# Patient Record
Sex: Female | Born: 1940 | Race: Black or African American | Hispanic: No | Smoking: Never smoker
Health system: Southern US, Community
[De-identification: ages and names within clinical notes are randomized; demographics above are authoritative.]

## PROBLEM LIST (undated history)

## (undated) DIAGNOSIS — K635 Polyp of colon: Secondary | ICD-10-CM

## (undated) DIAGNOSIS — H409 Unspecified glaucoma: Secondary | ICD-10-CM

## (undated) DIAGNOSIS — K5909 Other constipation: Secondary | ICD-10-CM

## (undated) DIAGNOSIS — I10 Essential (primary) hypertension: Secondary | ICD-10-CM

## (undated) DIAGNOSIS — D500 Iron deficiency anemia secondary to blood loss (chronic): Secondary | ICD-10-CM

## (undated) DIAGNOSIS — F411 Generalized anxiety disorder: Secondary | ICD-10-CM

## (undated) DIAGNOSIS — K219 Gastro-esophageal reflux disease without esophagitis: Secondary | ICD-10-CM

## (undated) DIAGNOSIS — M545 Low back pain, unspecified: Secondary | ICD-10-CM

## (undated) DIAGNOSIS — E782 Mixed hyperlipidemia: Secondary | ICD-10-CM

## (undated) DIAGNOSIS — K222 Esophageal obstruction: Secondary | ICD-10-CM

## (undated) DIAGNOSIS — M949 Disorder of cartilage, unspecified: Secondary | ICD-10-CM

## (undated) DIAGNOSIS — E119 Type 2 diabetes mellitus without complications: Secondary | ICD-10-CM

## (undated) DIAGNOSIS — F4321 Adjustment disorder with depressed mood: Secondary | ICD-10-CM

## (undated) DIAGNOSIS — M1990 Unspecified osteoarthritis, unspecified site: Secondary | ICD-10-CM

## (undated) DIAGNOSIS — M899 Disorder of bone, unspecified: Secondary | ICD-10-CM

## (undated) HISTORY — PX: HEMORROIDECTOMY: SUR656

## (undated) HISTORY — PX: OTHER SURGICAL HISTORY: SHX169

## (undated) HISTORY — DX: Polyp of colon: K63.5

## (undated) HISTORY — PX: BACK SURGERY: SHX140

## (undated) HISTORY — DX: Other constipation: K59.09

## (undated) HISTORY — DX: Unspecified glaucoma: H40.9

## (undated) HISTORY — DX: Disorder of bone, unspecified: M89.9

## (undated) HISTORY — DX: Low back pain, unspecified: M54.50

## (undated) HISTORY — PX: ROTATOR CUFF REPAIR: SHX139

## (undated) HISTORY — PX: POLYPECTOMY: SHX149

## (undated) HISTORY — DX: Low back pain: M54.5

## (undated) HISTORY — DX: Unspecified osteoarthritis, unspecified site: M19.90

## (undated) HISTORY — PX: TUBAL LIGATION: SHX77

## (undated) HISTORY — DX: Esophageal obstruction: K22.2

## (undated) HISTORY — DX: Essential (primary) hypertension: I10

## (undated) HISTORY — DX: Disorder of cartilage, unspecified: M94.9

## (undated) HISTORY — DX: Gastro-esophageal reflux disease without esophagitis: K21.9

## (undated) HISTORY — DX: Mixed hyperlipidemia: E78.2

## (undated) HISTORY — DX: Iron deficiency anemia secondary to blood loss (chronic): D50.0

## (undated) HISTORY — PX: EYE SURGERY: SHX253

## (undated) HISTORY — DX: Generalized anxiety disorder: F41.1

## (undated) HISTORY — DX: Type 2 diabetes mellitus without complications: E11.9

## (undated) HISTORY — DX: Adjustment disorder with depressed mood: F43.21

---

## 1997-11-04 ENCOUNTER — Other Ambulatory Visit: Admission: RE | Admit: 1997-11-04 | Discharge: 1997-11-04 | Payer: Self-pay | Admitting: Obstetrics & Gynecology

## 1998-11-22 ENCOUNTER — Other Ambulatory Visit: Admission: RE | Admit: 1998-11-22 | Discharge: 1998-11-22 | Payer: Self-pay | Admitting: Obstetrics and Gynecology

## 1998-11-22 ENCOUNTER — Encounter (INDEPENDENT_AMBULATORY_CARE_PROVIDER_SITE_OTHER): Payer: Self-pay

## 1998-12-22 ENCOUNTER — Other Ambulatory Visit: Admission: RE | Admit: 1998-12-22 | Discharge: 1998-12-22 | Payer: Self-pay | Admitting: Obstetrics and Gynecology

## 1999-12-29 ENCOUNTER — Encounter: Admission: RE | Admit: 1999-12-29 | Discharge: 1999-12-29 | Payer: Self-pay | Admitting: Obstetrics and Gynecology

## 1999-12-29 ENCOUNTER — Encounter: Payer: Self-pay | Admitting: Obstetrics and Gynecology

## 2000-02-22 ENCOUNTER — Other Ambulatory Visit: Admission: RE | Admit: 2000-02-22 | Discharge: 2000-02-22 | Payer: Self-pay | Admitting: Obstetrics and Gynecology

## 2001-02-06 ENCOUNTER — Encounter: Admission: RE | Admit: 2001-02-06 | Discharge: 2001-02-06 | Payer: Self-pay | Admitting: Obstetrics and Gynecology

## 2001-02-06 ENCOUNTER — Encounter: Payer: Self-pay | Admitting: Obstetrics and Gynecology

## 2001-02-07 ENCOUNTER — Encounter: Admission: RE | Admit: 2001-02-07 | Discharge: 2001-02-07 | Payer: Self-pay | Admitting: Obstetrics and Gynecology

## 2001-02-07 ENCOUNTER — Encounter: Payer: Self-pay | Admitting: Obstetrics and Gynecology

## 2001-02-24 ENCOUNTER — Other Ambulatory Visit: Admission: RE | Admit: 2001-02-24 | Discharge: 2001-02-24 | Payer: Self-pay | Admitting: Obstetrics and Gynecology

## 2002-01-26 ENCOUNTER — Encounter: Admission: RE | Admit: 2002-01-26 | Discharge: 2002-01-26 | Payer: Self-pay | Admitting: Obstetrics and Gynecology

## 2002-01-26 ENCOUNTER — Encounter: Payer: Self-pay | Admitting: Obstetrics and Gynecology

## 2003-02-22 ENCOUNTER — Encounter: Admission: RE | Admit: 2003-02-22 | Discharge: 2003-02-22 | Payer: Self-pay | Admitting: Obstetrics and Gynecology

## 2003-02-22 ENCOUNTER — Encounter: Payer: Self-pay | Admitting: Obstetrics and Gynecology

## 2003-02-25 ENCOUNTER — Encounter: Payer: Self-pay | Admitting: Obstetrics and Gynecology

## 2003-02-25 ENCOUNTER — Encounter: Admission: RE | Admit: 2003-02-25 | Discharge: 2003-02-25 | Payer: Self-pay | Admitting: Obstetrics and Gynecology

## 2004-02-21 ENCOUNTER — Encounter: Admission: RE | Admit: 2004-02-21 | Discharge: 2004-02-21 | Payer: Self-pay | Admitting: Obstetrics and Gynecology

## 2004-05-14 DIAGNOSIS — K222 Esophageal obstruction: Secondary | ICD-10-CM

## 2004-05-14 HISTORY — DX: Esophageal obstruction: K22.2

## 2005-03-15 ENCOUNTER — Ambulatory Visit: Payer: Self-pay | Admitting: Gastroenterology

## 2005-03-21 ENCOUNTER — Ambulatory Visit: Payer: Self-pay | Admitting: Gastroenterology

## 2005-03-23 ENCOUNTER — Encounter: Admission: RE | Admit: 2005-03-23 | Discharge: 2005-03-23 | Payer: Self-pay | Admitting: Obstetrics and Gynecology

## 2005-03-29 ENCOUNTER — Ambulatory Visit: Payer: Self-pay | Admitting: Gastroenterology

## 2005-04-16 ENCOUNTER — Ambulatory Visit: Payer: Self-pay | Admitting: Gastroenterology

## 2006-03-26 ENCOUNTER — Encounter: Admission: RE | Admit: 2006-03-26 | Discharge: 2006-03-26 | Payer: Self-pay | Admitting: Obstetrics and Gynecology

## 2006-04-05 ENCOUNTER — Encounter: Admission: RE | Admit: 2006-04-05 | Discharge: 2006-04-05 | Payer: Self-pay | Admitting: Obstetrics and Gynecology

## 2006-05-21 ENCOUNTER — Encounter (HOSPITAL_COMMUNITY): Admission: RE | Admit: 2006-05-21 | Discharge: 2006-06-20 | Payer: Self-pay | Admitting: Orthopedic Surgery

## 2006-06-24 ENCOUNTER — Encounter (HOSPITAL_COMMUNITY): Admission: RE | Admit: 2006-06-24 | Discharge: 2006-07-24 | Payer: Self-pay | Admitting: Orthopedic Surgery

## 2006-07-29 ENCOUNTER — Encounter (HOSPITAL_COMMUNITY): Admission: RE | Admit: 2006-07-29 | Discharge: 2006-08-28 | Payer: Self-pay | Admitting: Orthopedic Surgery

## 2006-09-19 ENCOUNTER — Ambulatory Visit: Payer: Self-pay | Admitting: Gastroenterology

## 2006-10-04 ENCOUNTER — Emergency Department (HOSPITAL_COMMUNITY): Admission: EM | Admit: 2006-10-04 | Discharge: 2006-10-04 | Payer: Self-pay | Admitting: *Deleted
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
## 2007-04-01 ENCOUNTER — Encounter: Admission: RE | Admit: 2007-04-01 | Discharge: 2007-04-01 | Payer: Self-pay | Admitting: Obstetrics and Gynecology

## 2007-05-07 ENCOUNTER — Ambulatory Visit: Payer: Self-pay | Admitting: Family Medicine

## 2007-05-07 ENCOUNTER — Telehealth (INDEPENDENT_AMBULATORY_CARE_PROVIDER_SITE_OTHER): Payer: Self-pay | Admitting: *Deleted

## 2007-05-07 DIAGNOSIS — K219 Gastro-esophageal reflux disease without esophagitis: Secondary | ICD-10-CM

## 2007-05-07 DIAGNOSIS — M545 Low back pain: Secondary | ICD-10-CM

## 2007-05-07 DIAGNOSIS — M129 Arthropathy, unspecified: Secondary | ICD-10-CM

## 2007-05-07 DIAGNOSIS — H409 Unspecified glaucoma: Secondary | ICD-10-CM | POA: Insufficient documentation

## 2007-05-12 ENCOUNTER — Encounter (INDEPENDENT_AMBULATORY_CARE_PROVIDER_SITE_OTHER): Payer: Self-pay | Admitting: Family Medicine

## 2007-05-13 ENCOUNTER — Telehealth (INDEPENDENT_AMBULATORY_CARE_PROVIDER_SITE_OTHER): Payer: Self-pay | Admitting: *Deleted

## 2007-05-13 ENCOUNTER — Ambulatory Visit: Payer: Self-pay | Admitting: Cardiovascular Disease

## 2007-05-13 LAB — CONVERTED CEMR LAB
Basophils Absolute: 0 10*3/uL (ref 0.0–0.1)
Basophils Relative: 1 % (ref 0–1)
Cholesterol: 197 mg/dL (ref 0–200)
Creatinine, Ser: 0.88 mg/dL (ref 0.40–1.20)
Eosinophils Absolute: 0 10*3/uL (ref 0.0–0.7)
Eosinophils Relative: 1 % (ref 0–5)
HDL: 61 mg/dL (ref >39)
LDL Cholesterol: 121 mg/dL — ABNORMAL HIGH (ref 0–99)
Lymphocytes Relative: 53 % — ABNORMAL HIGH (ref 12–46)
Lymphs Abs: 1.8 10*3/uL (ref 0.7–4.0)
MCHC: 31.7 g/dL (ref 30.0–36.0)
Monocytes Absolute: 0.3 10*3/uL (ref 0.1–1.0)
Monocytes Relative: 7 % (ref 3–12)
Neutro Abs: 1.3 10*3/uL — ABNORMAL LOW (ref 1.7–7.7)
Sodium: 143 meq/L (ref 135–145)
Total CHOL/HDL Ratio: 3.2
Total Protein: 7.5 g/dL (ref 6.0–8.3)
WBC: 3.4 10*3/uL — ABNORMAL LOW (ref 4.0–10.5)

## 2007-05-29 ENCOUNTER — Ambulatory Visit: Payer: Self-pay | Admitting: Family Medicine

## 2007-05-29 LAB — CONVERTED CEMR LAB: HDL goal, serum: 40 mg/dL

## 2007-06-09 ENCOUNTER — Encounter (INDEPENDENT_AMBULATORY_CARE_PROVIDER_SITE_OTHER): Payer: Self-pay | Admitting: Family Medicine

## 2007-06-09 ENCOUNTER — Encounter (HOSPITAL_COMMUNITY): Admission: RE | Admit: 2007-06-09 | Discharge: 2007-07-09 | Payer: Self-pay | Admitting: Cardiovascular Disease

## 2007-06-09 ENCOUNTER — Ambulatory Visit: Payer: Self-pay | Admitting: Cardiovascular Disease

## 2007-06-11 ENCOUNTER — Emergency Department (HOSPITAL_COMMUNITY): Admission: EM | Admit: 2007-06-11 | Discharge: 2007-06-11 | Payer: Self-pay | Admitting: Emergency Medicine

## 2007-06-13 ENCOUNTER — Telehealth (INDEPENDENT_AMBULATORY_CARE_PROVIDER_SITE_OTHER): Payer: Self-pay | Admitting: Family Medicine

## 2007-06-26 ENCOUNTER — Ambulatory Visit: Payer: Self-pay | Admitting: Family Medicine

## 2007-07-10 ENCOUNTER — Ambulatory Visit: Payer: Self-pay | Admitting: Family Medicine

## 2007-08-15 ENCOUNTER — Ambulatory Visit: Payer: Self-pay | Admitting: Family Medicine

## 2007-08-21 ENCOUNTER — Encounter (INDEPENDENT_AMBULATORY_CARE_PROVIDER_SITE_OTHER): Payer: Self-pay | Admitting: Family Medicine

## 2007-09-01 ENCOUNTER — Telehealth (INDEPENDENT_AMBULATORY_CARE_PROVIDER_SITE_OTHER): Payer: Self-pay | Admitting: *Deleted

## 2007-09-01 ENCOUNTER — Ambulatory Visit: Payer: Self-pay | Admitting: Family Medicine

## 2007-09-01 LAB — CONVERTED CEMR LAB
Glucose, Urine, Semiquant: NEGATIVE
Nitrite: NEGATIVE
Specific Gravity, Urine: 1.025
WBC Urine, dipstick: NEGATIVE

## 2007-09-08 ENCOUNTER — Ambulatory Visit (HOSPITAL_COMMUNITY): Admission: RE | Admit: 2007-09-08 | Discharge: 2007-09-08 | Payer: Self-pay | Admitting: Family Medicine

## 2007-09-08 ENCOUNTER — Encounter (INDEPENDENT_AMBULATORY_CARE_PROVIDER_SITE_OTHER): Payer: Self-pay | Admitting: Family Medicine

## 2007-09-09 ENCOUNTER — Telehealth (INDEPENDENT_AMBULATORY_CARE_PROVIDER_SITE_OTHER): Payer: Self-pay | Admitting: *Deleted

## 2007-09-12 ENCOUNTER — Ambulatory Visit: Payer: Self-pay | Admitting: Family Medicine

## 2007-09-12 DIAGNOSIS — M899 Disorder of bone, unspecified: Secondary | ICD-10-CM | POA: Insufficient documentation

## 2007-09-12 DIAGNOSIS — M949 Disorder of cartilage, unspecified: Secondary | ICD-10-CM

## 2007-12-01 ENCOUNTER — Ambulatory Visit: Payer: Self-pay | Admitting: Family Medicine

## 2007-12-01 ENCOUNTER — Telehealth (INDEPENDENT_AMBULATORY_CARE_PROVIDER_SITE_OTHER): Payer: Self-pay | Admitting: Family Medicine

## 2007-12-01 DIAGNOSIS — I10 Essential (primary) hypertension: Secondary | ICD-10-CM | POA: Insufficient documentation

## 2007-12-12 ENCOUNTER — Telehealth (INDEPENDENT_AMBULATORY_CARE_PROVIDER_SITE_OTHER): Payer: Self-pay | Admitting: *Deleted

## 2007-12-29 ENCOUNTER — Ambulatory Visit: Payer: Self-pay | Admitting: Family Medicine

## 2007-12-29 DIAGNOSIS — K5900 Constipation, unspecified: Secondary | ICD-10-CM | POA: Insufficient documentation

## 2007-12-29 DIAGNOSIS — IMO0002 Reserved for concepts with insufficient information to code with codable children: Secondary | ICD-10-CM

## 2007-12-30 ENCOUNTER — Encounter (INDEPENDENT_AMBULATORY_CARE_PROVIDER_SITE_OTHER): Payer: Self-pay | Admitting: Family Medicine

## 2008-01-01 LAB — CONVERTED CEMR LAB

## 2008-01-26 ENCOUNTER — Ambulatory Visit: Payer: Self-pay | Admitting: Family Medicine

## 2008-02-13 ENCOUNTER — Encounter (INDEPENDENT_AMBULATORY_CARE_PROVIDER_SITE_OTHER): Payer: Self-pay | Admitting: Family Medicine

## 2008-02-19 ENCOUNTER — Encounter (INDEPENDENT_AMBULATORY_CARE_PROVIDER_SITE_OTHER): Payer: Self-pay | Admitting: Family Medicine

## 2008-02-27 ENCOUNTER — Ambulatory Visit: Payer: Self-pay | Admitting: Family Medicine

## 2008-02-27 DIAGNOSIS — D259 Leiomyoma of uterus, unspecified: Secondary | ICD-10-CM | POA: Insufficient documentation

## 2008-03-08 ENCOUNTER — Encounter (INDEPENDENT_AMBULATORY_CARE_PROVIDER_SITE_OTHER): Payer: Self-pay | Admitting: Family Medicine

## 2008-04-01 ENCOUNTER — Encounter: Admission: RE | Admit: 2008-04-01 | Discharge: 2008-04-01 | Payer: Self-pay | Admitting: Obstetrics and Gynecology

## 2008-05-28 ENCOUNTER — Ambulatory Visit: Payer: Self-pay | Admitting: Family Medicine

## 2008-06-09 ENCOUNTER — Encounter (INDEPENDENT_AMBULATORY_CARE_PROVIDER_SITE_OTHER): Payer: Self-pay | Admitting: Family Medicine

## 2008-08-27 ENCOUNTER — Ambulatory Visit: Payer: Self-pay | Admitting: Family Medicine

## 2008-08-31 ENCOUNTER — Encounter (INDEPENDENT_AMBULATORY_CARE_PROVIDER_SITE_OTHER): Payer: Self-pay | Admitting: Family Medicine

## 2008-09-02 LAB — CONVERTED CEMR LAB
ALT: 17 units/L (ref 0–35)
Alkaline Phosphatase: 65 units/L (ref 39–117)
CO2: 28 meq/L (ref 19–32)
Chloride: 105 meq/L (ref 96–112)
Eosinophils Relative: 0 % (ref 0–5)
LDL Cholesterol: 135 mg/dL — ABNORMAL HIGH (ref 0–99)
Lymphocytes Relative: 59 % — ABNORMAL HIGH (ref 12–46)
Lymphs Abs: 2.6 10*3/uL (ref 0.7–4.0)
MCHC: 32.1 g/dL (ref 30.0–36.0)
Monocytes Absolute: 0.4 10*3/uL (ref 0.1–1.0)
Neutro Abs: 1.4 10*3/uL — ABNORMAL LOW (ref 1.7–7.7)
Neutrophils Relative %: 32 % — ABNORMAL LOW (ref 43–77)
RBC: 4.4 M/uL (ref 3.87–5.11)
RDW: 14.4 % (ref 11.5–15.5)
Total CHOL/HDL Ratio: 3.9
Total Protein: 8 g/dL (ref 6.0–8.3)
VLDL: 23 mg/dL (ref 0–40)
WBC: 4.3 10*3/uL (ref 4.0–10.5)

## 2008-11-04 ENCOUNTER — Ambulatory Visit: Payer: Self-pay | Admitting: Family Medicine

## 2008-11-04 DIAGNOSIS — R1310 Dysphagia, unspecified: Secondary | ICD-10-CM | POA: Insufficient documentation

## 2008-11-11 DIAGNOSIS — Z86010 Personal history of colonic polyps: Secondary | ICD-10-CM | POA: Insufficient documentation

## 2008-11-11 DIAGNOSIS — K222 Esophageal obstruction: Secondary | ICD-10-CM

## 2008-11-11 DIAGNOSIS — K449 Diaphragmatic hernia without obstruction or gangrene: Secondary | ICD-10-CM | POA: Insufficient documentation

## 2008-11-11 DIAGNOSIS — K589 Irritable bowel syndrome without diarrhea: Secondary | ICD-10-CM

## 2008-11-12 ENCOUNTER — Ambulatory Visit: Payer: Self-pay | Admitting: Gastroenterology

## 2008-12-03 ENCOUNTER — Ambulatory Visit: Payer: Self-pay | Admitting: Family Medicine

## 2008-12-03 DIAGNOSIS — F4321 Adjustment disorder with depressed mood: Secondary | ICD-10-CM | POA: Insufficient documentation

## 2008-12-03 LAB — CONVERTED CEMR LAB
HDL goal, serum: 40 mg/dL
LDL Goal: 130 mg/dL

## 2008-12-08 ENCOUNTER — Emergency Department (HOSPITAL_COMMUNITY): Admission: EM | Admit: 2008-12-08 | Discharge: 2008-12-08 | Payer: Self-pay | Admitting: Emergency Medicine

## 2008-12-15 ENCOUNTER — Ambulatory Visit: Payer: Self-pay | Admitting: Gastroenterology

## 2008-12-15 ENCOUNTER — Ambulatory Visit: Payer: Self-pay | Admitting: Family Medicine

## 2008-12-15 DIAGNOSIS — S139XXA Sprain of joints and ligaments of unspecified parts of neck, initial encounter: Secondary | ICD-10-CM

## 2008-12-15 DIAGNOSIS — F411 Generalized anxiety disorder: Secondary | ICD-10-CM | POA: Insufficient documentation

## 2008-12-15 DIAGNOSIS — S335XXA Sprain of ligaments of lumbar spine, initial encounter: Secondary | ICD-10-CM

## 2008-12-17 LAB — CONVERTED CEMR LAB: UREASE: POSITIVE

## 2008-12-20 ENCOUNTER — Telehealth (INDEPENDENT_AMBULATORY_CARE_PROVIDER_SITE_OTHER): Payer: Self-pay | Admitting: Family Medicine

## 2008-12-20 ENCOUNTER — Telehealth: Payer: Self-pay | Admitting: Gastroenterology

## 2009-01-05 ENCOUNTER — Encounter (HOSPITAL_COMMUNITY): Admission: RE | Admit: 2009-01-05 | Discharge: 2009-02-04 | Payer: Self-pay | Admitting: Sports Medicine

## 2009-01-14 ENCOUNTER — Ambulatory Visit: Payer: Self-pay | Admitting: Family Medicine

## 2009-01-14 DIAGNOSIS — A048 Other specified bacterial intestinal infections: Secondary | ICD-10-CM | POA: Insufficient documentation

## 2009-01-14 DIAGNOSIS — E782 Mixed hyperlipidemia: Secondary | ICD-10-CM

## 2009-01-14 DIAGNOSIS — E1169 Type 2 diabetes mellitus with other specified complication: Secondary | ICD-10-CM | POA: Insufficient documentation

## 2009-02-10 ENCOUNTER — Ambulatory Visit: Payer: Self-pay | Admitting: Gastroenterology

## 2009-04-04 ENCOUNTER — Encounter: Admission: RE | Admit: 2009-04-04 | Discharge: 2009-04-04 | Payer: Self-pay | Admitting: Obstetrics and Gynecology

## 2009-04-11 ENCOUNTER — Encounter (INDEPENDENT_AMBULATORY_CARE_PROVIDER_SITE_OTHER): Payer: Self-pay | Admitting: Family Medicine

## 2009-05-10 ENCOUNTER — Ambulatory Visit: Payer: Self-pay | Admitting: Gastroenterology

## 2009-05-10 LAB — CONVERTED CEMR LAB
ALT: 14 units/L (ref 0–35)
Albumin: 4.4 g/dL (ref 3.5–5.2)
Alkaline Phosphatase: 54 units/L (ref 39–117)
Basophils Absolute: 0.1 10*3/uL (ref 0.0–0.1)
CO2: 28 meq/L (ref 19–32)
Calcium: 9.9 mg/dL (ref 8.4–10.5)
Eosinophils Relative: 0.4 % (ref 0.0–5.0)
Folate: 12.8 ng/mL
Glucose, Bld: 107 mg/dL — ABNORMAL HIGH (ref 70–99)
Iron: 89 ug/dL (ref 42–145)
Lymphs Abs: 1.9 10*3/uL (ref 0.7–4.0)
MCHC: 33.4 g/dL (ref 30.0–36.0)
Neutro Abs: 1.5 10*3/uL (ref 1.4–7.7)
Potassium: 4.1 meq/L (ref 3.5–5.1)
RDW: 13 % (ref 11.5–14.6)
Sodium: 142 meq/L (ref 135–145)
TSH: 2.13 microintl units/mL (ref 0.35–5.50)
Total Protein: 8 g/dL (ref 6.0–8.3)
Transferrin: 226.3 mg/dL (ref 212.0–360.0)
WBC: 3.7 10*3/uL — ABNORMAL LOW (ref 4.5–10.5)

## 2009-05-14 DIAGNOSIS — K635 Polyp of colon: Secondary | ICD-10-CM

## 2009-05-14 HISTORY — DX: Polyp of colon: K63.5

## 2009-05-18 ENCOUNTER — Ambulatory Visit: Payer: Self-pay | Admitting: Gastroenterology

## 2009-05-20 ENCOUNTER — Encounter: Payer: Self-pay | Admitting: Gastroenterology

## 2010-06-03 ENCOUNTER — Encounter: Payer: Self-pay | Admitting: Obstetrics and Gynecology

## 2010-06-04 ENCOUNTER — Encounter: Payer: Self-pay | Admitting: Obstetrics and Gynecology

## 2010-06-15 NOTE — Procedures (Signed)
Summary: Colonoscopy  Patient: Monica [name] Note: All result statuses are Final unless otherwise noted.  Tests: (1) Colonoscopy (COL)   COL Colonoscopy           [facility]ONE      [facility]     [address]     [place], [place]  [address]           COLONOSCOPY PROCEDURE REPORT           PATIENT:  Monica [name], Monica [name]  MR#:  [id number]     BIRTHDATE:  Nov 12, 1940, 68 yrs. old  GENDER:  female           ENDOSCOPIST:  Vania Rea. Jarold Motto, MD, [facility]     Referred by:           PROCEDURE DATE:  05/18/2009     PROCEDURE:  Colonoscopy with snare polypectomy     ASA CLASS:  Class II     INDICATIONS:  constipation, abdominal pain, Routine Risk Screening                 MEDICATIONS:   Fentanyl 50 mcg IV, Versed 5 mg IV           DESCRIPTION OF PROCEDURE:   After the risks benefits and     alternatives of the procedure were thoroughly explained, informed     consent was obtained.  Digital rectal exam was performed and     revealed no rectal masses.   The LB CF-[id number] endoscope     was introduced through the anus and advanced to the cecum, which     was identified by both the appendix and ileocecal valve, without     limitations.  The quality of the prep was excellent, using     MoviPrep.  The instrument was then slowly withdrawn as the colon     was fully examined.     <<PROCEDUREIMAGES>>           FINDINGS:  ENDOSCOPIC FINDINGS:  A sessile polyp was found. [name]5 CM     POLYP SNARE EXCISED. This was otherwise a normal examination of     the colon.   Retroflexed views in the rectum revealed no     abnormalities.    The scope was then withdrawn from the patient     and the procedure completed.           COMPLICATIONS:  None           ENDOSCOPIC IMPRESSION:     1) Sessile polyp     2) Otherwise normal examination     CHRONIC FUNCTIONAL CONSTIPATION.     RECOMMENDATIONS:     1) Follow up colonoscopy in 5 years           REPEAT EXAM:  No            ______________________________     Vania Rea. Jarold Motto, MD, [facility] Graham           CC:  Butch Penny, MD           n.     Rosalie DoctorVania Rea. Betty Brooks at 05/18/2009 03:33 PM           Drue Second, [id number]  Note: An exclamation mark (!) indicates a result that was not dispersed into the flowsheet. Document Creation Date: 05/18/2009 3:34 PM _______________________________________________________________________  (1) Order result status: Final Collection or observation date-time: 05/18/2009 15:25 Requested date-time:  Receipt date-time:  Reported date-time:  Referring  Physician:   Ordering Physician: Sheryn Bison [phone]) Specimen Source:  Source: Launa Grill Order Number: [id number] Lab site:   Appended Document: Colonoscopy     Procedures Next Due Date:    Colonoscopy: 05/2014

## 2010-06-15 NOTE — Letter (Signed)
Summary: Patient Notice- Polyp Results  [facility]  [address] [place], [place] [address]   Phone: [phone]  Fax: [phone]        May 20, 2009 MRN: [id number]    [address] [place], [place]  [address]    Dear Ms. Capelle,  I am pleased to inform you that the colon polyp(s) removed during your recent colonoscopy was (were) found to be benign (no cancer detected) upon pathologic examination.  I recommend you have a repeat colonoscopy examination in 5_ years to look for recurrent polyps, as having colon polyps increases your risk for having recurrent polyps or even colon cancer in the future.  Should you develop new or worsening symptoms of abdominal pain, bowel habit changes or bleeding from the rectum or bowels, please schedule an evaluation with either your primary care physician or with me.  Additional information/recommendations:  _XX_ No further action with gastroenterology is needed at this time. Please      follow-up with your primary care physician for your other healthcare      needs.  __ Please call [phone] to schedule a return visit to review your      situation.  __ Please keep your follow-up visit as already scheduled.  __ Continue treatment plan as outlined the day of your exam.  Please call us if you are having persistent problems or have questions about your condition that have not been fully answered at this time.  Sincerely,  Mardella Layman MD [facility]  This letter has been electronically signed by your physician.  Appended Document: Patient Notice- Polyp Results Letter mailed 01.10.11  Appended Document: Patient Notice- Polyp Results May 20, 2009 MRN: [id number]    [address] [place], [place]  [address]    Dear Ms. Elza,  I am pleased to inform you that the colon polyp(s) removed during your recent colonoscopy was (were) found to be benign (no cancer detected) upon pathologic examination.  I  recommend you have a repeat colonoscopy examination in 5_ years to look for recurrent polyps, as having colon polyps increases your risk for having recurrent polyps or even colon cancer in the future.  Should you develop new or worsening symptoms of abdominal pain, bowel habit changes or bleeding from the rectum or bowels, please schedule an evaluation with either your primary care physician or with me.  Additional information/recommendations:  _XX_ No further action with gastroenterology is needed at this time. Please      follow-up with your primary care physician for your other healthcare      needs.  __ Please call [phone] to schedule a return visit to review your      situation.  __ Please keep your follow-up visit as already scheduled.  __ Continue treatment plan as outlined the day of your exam.  Please call us if you are having persistent problems or have questions about your condition that have not been fully answered at this time.  Sincerely,  Mardella Layman MD [facility]  This letter has been electronically signed by your physician.   Letter returned no mail receptacle unable to forward. I had to resend letter with [address] address.

## 2010-09-26 NOTE — Assessment & Plan Note (Signed)
[facility]                         GASTROENTEROLOGY OFFICE NOTE   Monica [name]                      MRN:          [id number]  DATE:09/19/2006                            DOB:          [birth year]    HISTORY OF PRESENT ILLNESS:  Monica [name] continues to complain of some  periodic left lower quadrant growling.  Monica really denies pain or bare  [name] and is taking MiraLax on a fairly regular basis.  Monica  denies upper GI or hepatobiliary problems.  Is having no dysphagia or  reflux symptoms.  Last colonoscopy exam was completed in December 2006  and was unremarkable as was endoscopy except for esophageal stricture  that was dilated.  The patient cannot afford PPI therapy.   PHYSICAL EXAMINATION:  GENERAL:  Monica is awake and alert in no acute  distress, appears her stated age.  VITAL SIGNS:  Weight 146 pounds, blood pressure 140/86, pulse 60 and  regular.  ABDOMEN:  Entirely benign without organomegaly, masses, tenderness or  distention.  Bowel sounds were normal and nonobstructive in quality.   ASSESSMENT:  1. Irritable bowel syndrome.  2. History of chronic leukopenia, being followed by Dr. Eliberto Ivory with      blood work planned apparently within the next two weeks.  3. Chronic acid reflux disease, doing well on dietary therapy.   RECOMMENDATIONS:  1. High fiber diet with daily Benefiber and liberal p.o. fluids.  2. Continue p.r.n. MiraLax.  3. Blood work as mentioned above per Dr. Eliberto Ivory.     Monica Rea. Jarold Motto, MD, Caleen Essex, FAGA  Electronically Signed    DRP/MedQ  DD: 09/19/2006  DT: 09/19/2006  Job #: [id number]   cc:   Sabino Donovan Hull Dr. Onnie Boer H. Swords, MD

## 2010-09-26 NOTE — Assessment & Plan Note (Signed)
[facility]                       [place] CARDIOLOGY OFFICE NOTE   NORLEEN, XIE                      MRN:          [id number]  DATE:05/13/2007                            DOB:          1940-09-03    HISTORY OF PRESENT ILLNESS:  [name] is 70 year old patient referred  by Dr. Erby Pian for palpitations and chest pain.  The patient has no  documented previous history of heart problems.  She started having  problems about 4 weeks ago at night while lying in bed.  She had the  onset of some palpitations or forceful heartbeat.  She subsequently had  some mild tightening in her chest.  She said that feeling felt sort of  like bees fluttering.   She has continued to have occasional episodes.  They are not necessarily  exertional.  They can occur any time of the day.  They usually last 5-10  minutes.   There is no associated shortness of breath, syncope or diaphoresis.   She has not had a previous echo or stress test.   The patient apparently had some blood work of Dr. Claire Shown office  yesterday.  She said they were normal.  We will have to get these and  make sure that she had a thyroid check.  The patient has not had any  prior problems with palpitations.   No family history of sudden death or arrhythmia.   REVIEW OF SYSTEMS:  Otherwise, negative.  In particular she is really  not had classic exertional chest pain.  She has not had a history of PE  or DVT.  No history of cardiomyopathy.   PAST MEDICAL HISTORY:  1. Remarkable for irritable bowel syndrome which she has seen Dr.      Jarold Motto for in the past.  2. She has had previous back surgery.  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  3. There is also a history of right rotator cuff surgery.  4. She has no history of hypertension, hyperlipidemia, diabetes.   HABITS:  She is a nonsmoker, nondrinker.   ALLERGIES:  CODEINE CAUSES NAUSEA.   CURRENT MEDICATION:  Include  MiraLax p.r.n., vitamins, baby aspirin a  day and vitamin D.  She also takes Prevacid 30 mg a day.   FAMILY HISTORY:  Noncontributory.   SOCIAL HISTORY:  The patient does not work.  She has five children.  She  is divorced.  She spends most of her day taking care of one her only  grandchild.  She used to work at [employer], but does not anymore.  She is  otherwise fairly sedentary   PHYSICAL EXAMINATION:  GENERAL:  Remarkable for a healthy appearing,  middle-aged black female in no distress.  VITAL SIGNS:  Blood pressure is 140/80, pulse 64 and regular, weight is  140, afebrile, respiratory rate 14.  HEENT:  Unremarkable.  Carotids normal without bruit.  No  lymphadenopathy, thyromegaly or JVP elevation.  LUNGS:  Clear diaphragmatic motion.  No wheezing.  She is status post  right rotator cuff surgery.  HEART:  There is an S1,  S2, normal heart sounds.  PMI normal.  ABDOMEN:  Benign bowel sounds positive.  No bruit.  No  hepatosplenomegaly.  No jugular reflux or tenderness.  EXTREMITIES:  Distal pulses intact.  No edema.  NEUROLOGICAL:  Nonfocal.  SKIN:  Warm and dry.  No muscular weakness.   STUDIES:  EKG is normal with QT interval of 404.   IMPRESSION:  1. Atypical chest pain.  Follow up stress Myoview.  2. Palpitations, sound benign.  No long-lasting episodes of rapid      heartbeat.  Continue to monitor.  If these symptoms get worse, he      can have an event monitor.  3. History of irritable bowel.  Continue p.r.n. MiraLax.  Follow-up      with Dr. Jarold Motto.  4. History of reflux.  Continue Prevacid 30 mg a day, avoid spicy food      and late night meals.   So long as Monica [name]'s stress test is normal, I do not think she will need  further cardiac workup.     Monica [name]. Eden Emms, MD, [facility]  Electronically Signed    PCN/MedQ  DD: 05/13/2007  DT: 05/13/2007  Job #: [id number]   cc:   Franchot Heidelberg, M.D.

## 2010-09-29 NOTE — H&P (Signed)
NAMEMARIADEL, MRUK               ACCOUNT NO.:  [id number]   MEDICAL RECORD NO.:  [id number]          PATIENT TYPE:  REC   LOCATION:  [facility]                           FACILITY:  [facility]   PHYSICIAN:  Bruce H. Swords, MD    DATE OF BIRTH:  1940/10/25   DATE OF ADMISSION:  05/21/2006  DATE OF DISCHARGE:                              HISTORY & PHYSICAL   ADDENDUM   HISTORY AND PHYSICAL   The patient was walking out of my office to be transported to [facility].  She had a syncopal spell.  She was resuscitated with IV  fluids.  She became conscious but did pass out several times while in my  office.  EMS was called to transport.  Given these events, she will be  transported to the Emergency Department.  I will consult Critical Care.  I will admit her to the Intensive Care Unit.  She will need IV fluids,  oxygen.  I will rule her out for a myocardial infarction.  Will check an  EKG.      Bruce Rexene Edison Swords, MD  Electronically Signed     BHS/MEDQ  D:  06/13/2006  T:  06/13/2006  Job:  [id number]

## 2011-02-02 LAB — CBC
HCT: 36.4
Hemoglobin: 12.5
MCHC: 34.2
MCV: 91.3
Platelets: 255
WBC: 4.8

## 2011-02-02 LAB — DIFFERENTIAL
Lymphocytes Relative: 51 — ABNORMAL HIGH
Lymphs Abs: 2.4
Monocytes Absolute: 0.3
Neutro Abs: 2.1

## 2011-02-02 LAB — BASIC METABOLIC PANEL
BUN: 10
Chloride: 104
Creatinine, Ser: 0.77
GFR calc non Af Amer: >60
Sodium: 136

## 2011-02-27 ENCOUNTER — Other Ambulatory Visit (INDEPENDENT_AMBULATORY_CARE_PROVIDER_SITE_OTHER): Payer: Medicare HMO

## 2011-02-27 ENCOUNTER — Encounter: Payer: Self-pay | Admitting: Gastroenterology

## 2011-02-27 ENCOUNTER — Ambulatory Visit (INDEPENDENT_AMBULATORY_CARE_PROVIDER_SITE_OTHER): Payer: Medicare HMO | Admitting: Gastroenterology

## 2011-02-27 VITALS — BP 122/76 | HR 80 | Ht 66.0 in | Wt 156.8 lb

## 2011-02-27 DIAGNOSIS — Z860100 Personal history of colon polyps, unspecified: Secondary | ICD-10-CM

## 2011-02-27 DIAGNOSIS — K5909 Other constipation: Secondary | ICD-10-CM

## 2011-02-27 DIAGNOSIS — K5900 Constipation, unspecified: Secondary | ICD-10-CM

## 2011-02-27 DIAGNOSIS — Z79899 Other long term (current) drug therapy: Secondary | ICD-10-CM

## 2011-02-27 DIAGNOSIS — D72819 Decreased white blood cell count, unspecified: Secondary | ICD-10-CM

## 2011-02-27 DIAGNOSIS — K219 Gastro-esophageal reflux disease without esophagitis: Secondary | ICD-10-CM

## 2011-02-27 LAB — COMPREHENSIVE METABOLIC PANEL
CO2: 29 mEq/L (ref 19–32)
Creatinine, Ser: 0.8 mg/dL (ref 0.4–1.2)
GFR: 95.7 mL/min (ref >60.00)
Glucose, Bld: 108 mg/dL — ABNORMAL HIGH (ref 70–99)
Total Bilirubin: 1.2 mg/dL (ref 0.3–1.2)

## 2011-02-27 LAB — CBC WITH DIFFERENTIAL/PLATELET
Eosinophils Relative: 0.4 % (ref 0.0–5.0)
HCT: 39.2 % (ref 36.0–46.0)
Hemoglobin: 13.1 g/dL (ref 12.0–15.0)
Lymphs Abs: 1.8 10*3/uL (ref 0.7–4.0)
Monocytes Relative: 6.4 % (ref 3.0–12.0)
Neutro Abs: 1.6 10*3/uL (ref 1.4–7.7)
RBC: 4.25 Mil/uL (ref 3.87–5.11)
WBC: 3.6 10*3/uL — ABNORMAL LOW (ref 4.5–10.5)

## 2011-02-27 LAB — SEDIMENTATION RATE: Sed Rate: 18 mm/hr (ref 0–22)

## 2011-02-27 LAB — IBC PANEL: Saturation Ratios: 35.9 % (ref 20.0–50.0)

## 2011-02-27 LAB — FERRITIN: Ferritin: 19.6 ng/mL (ref 10.0–291.0)

## 2011-02-27 MED ORDER — ALIGN PO CAPS: 1.0000 | ORAL_CAPSULE | Freq: Every day | ORAL | Status: DC

## 2011-02-27 MED ORDER — LUBIPROSTONE 8 MCG PO CAPS: 8.0000 ug | ORAL_CAPSULE | Freq: Two times a day (BID) | ORAL | Status: DC

## 2011-02-27 MED ORDER — LUBIPROSTONE 24 MCG PO CAPS: 24.0000 ug | ORAL_CAPSULE | Freq: Two times a day (BID) | ORAL | Status: DC

## 2011-02-27 NOTE — Progress Notes (Signed)
This is a is a 70 year old African American female with chronic GERD well controlled with Dexilant 60 mg a day. She continues with severe constipation, and apparently had a disimpaction performed in the emergency room 2 months ago. She continues with small pellet-like stools without blood or significant abdominal pain. She could not tolerate MiraLax. Colonoscopy was performed one year ago with removal of a benign polyp. She is a regular diet denies a specific food intolerances. Her bowel movements are best controlled with daily prunes and by mouth fluids. She denies any other systemic or hepatobiliary complaints. She has had no anorexia, weight loss, significant abdominal pain. The patient does not use narcotics rather pain medications.  Current Medications, Allergies, Past Medical History, Past Surgical History, Family History and Social History were reviewed in [employer] record.  Pertinent Review of Systems Negative   Physical Exam: Healthy-appearing patient in no acute distress. There is no abdominal distention, organomegaly, masses or tenderness. Bowel sounds are normal. Inspection of the rectum is unremarkable without fissures or fistulae. There no rectal masses or tenderness. There is no stool present for guaiac testing. Mental status is normal without gross neurological deficits.    Assessment and Plan: Chronic functional constipation, rule out metabolic causes of constipation. Labs have been ordered and we will try Amitiza 24 mcg twice a day with meals,Align probiotic therapy, continue liberal by mouth fluids, and she is to call in 1 week's time for progress report. I have urged her to take Dexilant daily and not every other day. Her acid reflux is well controlled and she denies dysphagia. As per above, she is up-to-date on her colonoscopy and endoscopy exams. Review of labs shows a chronic leukopenia. No diagnosis found.

## 2011-02-27 NOTE — Patient Instructions (Signed)
We have given you samples of Amitiza 24 mcg to take 1 capsule by mouth twice daily with meals. Please call back in 1 week to let us know if this works well for you. We have given you samples of Align. This puts good bacteria back into your intestines. You should take 1 capsule by mouth once daily. If this works well for you, it can be purchased over the counter. Please take your Dexilant EVERY day, not every other day. Your physician has requested that you go to the basement for the following lab work before leaving today: CMET, CBC, TSH, IBC Panel, Vitamin B12, Folic Acid, Sedimentation Rate, Celiac Panel

## 2011-02-28 LAB — CELIAC PANEL 10
Gliadin IgG: 16.6 U/mL (ref <20)
IgA: 258 mg/dL (ref 69–380)

## 2011-03-06 ENCOUNTER — Telehealth: Payer: Self-pay | Admitting: Gastroenterology

## 2011-03-07 MED ORDER — LUBIPROSTONE 24 MCG PO CAPS: 24.0000 ug | ORAL_CAPSULE | Freq: Two times a day (BID) | ORAL | Status: AC

## 2011-03-07 NOTE — Telephone Encounter (Addendum)
lmom for pt to call back. Left samples at the front desk.

## 2011-03-07 NOTE — Telephone Encounter (Signed)
Informed pt I left her some samples of Amitiza at the front desk. Pt reports the Amitza is working some, not as good as she would like. She will continue the Amitiza, Align and liberal fluids and call back with a report.

## 2011-03-19 ENCOUNTER — Telehealth: Payer: Self-pay | Admitting: Gastroenterology

## 2011-03-20 NOTE — Telephone Encounter (Signed)
Pt reports her abdomen is "swole up" since she began [place] and [address]. She reports her constipation is better. Pt instructed to stop the Amitiza to see if the swelling subsides and call to inform us; pt stated understanding.

## 2011-07-05 ENCOUNTER — Encounter (HOSPITAL_COMMUNITY): Payer: Self-pay | Admitting: Pharmacist

## 2011-07-11 ENCOUNTER — Encounter (HOSPITAL_COMMUNITY): Payer: Self-pay

## 2011-07-11 ENCOUNTER — Other Ambulatory Visit: Payer: Self-pay

## 2011-07-11 ENCOUNTER — Encounter (HOSPITAL_COMMUNITY)
Admission: RE | Admit: 2011-07-11 | Discharge: 2011-07-11 | Disposition: A | Discharge status: Home / Self Care | Payer: Medicare Other | Source: Ambulatory Visit | Attending: Obstetrics and Gynecology | Admitting: Obstetrics and Gynecology

## 2011-07-11 DIAGNOSIS — Z01818 Encounter for other preprocedural examination: Secondary | ICD-10-CM | POA: Insufficient documentation

## 2011-07-11 LAB — BASIC METABOLIC PANEL
CO2: 29 mEq/L (ref 19–32)
Calcium: 9.4 mg/dL (ref 8.4–10.5)
Chloride: 102 mEq/L (ref 96–112)
Glucose, Bld: 111 mg/dL — ABNORMAL HIGH (ref 70–99)
Sodium: 138 mEq/L (ref 135–145)

## 2011-07-11 LAB — CBC
Hemoglobin: 12.4 g/dL (ref 12.0–15.0)
MCH: 29.2 pg (ref 26.0–34.0)
MCV: 92.2 fL (ref 78.0–100.0)
Platelets: 257 10*3/uL (ref 150–400)
RBC: 4.25 MIL/uL (ref 3.87–5.11)
WBC: 3.7 10*3/uL — ABNORMAL LOW (ref 4.0–10.5)

## 2011-07-11 NOTE — Patient Instructions (Addendum)
20 Monica [name]  07/11/2011   Your procedure is scheduled on:  07/20/11  Enter through the Main Entrance of [facility] at 6 AM.  Pick up the phone at the desk and dial [phone].   Call this number if you have problems the morning of surgery: [phone]   Remember:   Do not eat food:After Midnight.  Do not drink clear liquids: After Midnight.  Take these medicines the morning of surgery with A SIP OF WATER: Blood Pressure medication (Norvasc) and Reflux medication (Dexilant)   Do not wear jewelry, make-up or nail polish.  Do not wear lotions, powders, or perfumes. You may wear deodorant.  Do not shave 48 hours prior to surgery.  Do not bring valuables to the hospital.  Contacts, dentures or bridgework may not be worn into surgery.  Leave suitcase in the car. After surgery it may be brought to your room.  For patients admitted to the hospital, checkout time is 11:00 AM the day of discharge.   Patients discharged the day of surgery will not be allowed to drive home.  Name and phone number of your driver: daughter  Talmadge Coventry  Special Instructions: CHG Shower Use Special Wash: 1/2 bottle night before surgery and 1/2 bottle morning of surgery.   Please read over the following fact sheets that you were given: Surgical Site Infection Prevention

## 2011-07-11 NOTE — Pre-Procedure Instructions (Signed)
EKG of 07/11/11 approved by Cristela Blue, MD.   Patton Salles, R.N.

## 2011-07-19 ENCOUNTER — Other Ambulatory Visit: Payer: Self-pay | Admitting: Obstetrics and Gynecology

## 2011-07-19 NOTE — H&P (Signed)
NAME:  Monica [name], Monica [name]                    ACCOUNT NO.:  MEDICAL RECORD NO.:  [id number]  LOCATION:                                 FACILITY:  PHYSICIAN:  Malachi Pro. Ambrose Mantle, M.D. DATE OF BIRTH:  10/06/1940  DATE OF ADMISSION:  07/20/2011 DATE OF DISCHARGE:                             HISTORY & PHYSICAL   PRESENT ILLNESS:  This is a 71 year old black female, para 1-4-0-5 who is admitted for D and C, hysteroscopy because of an endometrial polyp found as an incidental finding on ultrasound to determine if there were masses in the pelvis other than fibroids.  The patient has had fibroids for years and on her last exam May 14, 2011 in my office, she was noted to have a posterior uterus irregular with multiple fibroids.  She was scheduled for colonoscopy on May 24, 2011 and the colonoscopist felt extrinsic compression on the bowel, so he had her have a CT scan of the abdomen and pelvis, which of course showed pelvic mass consistent with fibroids.  So, because of the extrinsic compression on the bowel in order to be very thorough, we did an ultrasound which showed multiple fibroids, the largest was 3.2 x 3.9 cm and 9 fibroids were measured, but in addition the ultrasound showed 35 x 15 mm fundal vascular polyp and a second polyp was 16 x 13 mm.  The patient was informed that experts differ on the recommendations for endometrial polyps found as incidental findings not associated with uterine bleeding.  The patient wanted to proceed and have the polyp removed.  So, she is admitted at the present time for that.  She says that codeine causes upset stomach and hydrochlorothiazide causes nausea and headache.  She has a history of hypertension and GERD.  She has had tubal ligation, back surgery, hemorrhoidectomy and rotator cuff surgery.  FAMILY HISTORY:  Brother with diabetes.  Father with heart failure. Mother with diabetes and sister with breast cancer and colon cancer.  SOCIAL  HISTORY:  The patient never smoked, does not use alcohol or drugs.  PHYSICAL EXAM:  VITAL SIGNS:  Blood pressure 138/78, weight on May 14, 2011 was 153. HEAD, EYES, NOSE, AND THROAT:  Normal. LUNGS:  Clear to auscultation. HEART:  Normal size and sounds.  No murmurs. ABDOMEN:  Soft, nontender.  No masses are palpable. GENITOURINARY:  Vulva, vagina clean.  The cervix is clean.  There is an 8 mm polyp present.  Uterus is posterior, irregular with multiple fibroids.  Adnexa are free and rectal exam is negative.  ADMITTING IMPRESSION:  Leiomyomata uteri, endometrial polyp.  The patient is admitted after her desire to have the endometrial polyp removed.  She has been counseled about the risks of surgery.    Malachi Pro. Ambrose Mantle, M.D.    TFH/MEDQ  D:  07/19/2011  T:  07/19/2011  Job:  [id number]

## 2011-07-20 ENCOUNTER — Encounter (HOSPITAL_COMMUNITY)
Admission: RE | Disposition: A | Discharge status: Home Health | Payer: Self-pay | Source: Ambulatory Visit | Attending: Obstetrics and Gynecology

## 2011-07-20 ENCOUNTER — Encounter (HOSPITAL_COMMUNITY): Payer: Self-pay | Admitting: Anesthesiology

## 2011-07-20 ENCOUNTER — Ambulatory Visit (HOSPITAL_COMMUNITY)
Admission: RE | Admit: 2011-07-20 | Discharge: 2011-07-20 | Disposition: A | Discharge status: Home Health | Payer: Medicare Other | Source: Ambulatory Visit | Attending: Obstetrics and Gynecology | Admitting: Obstetrics and Gynecology

## 2011-07-20 ENCOUNTER — Inpatient Hospital Stay (HOSPITAL_COMMUNITY): Payer: Medicare Other | Admitting: Anesthesiology

## 2011-07-20 DIAGNOSIS — Z01818 Encounter for other preprocedural examination: Secondary | ICD-10-CM | POA: Insufficient documentation

## 2011-07-20 DIAGNOSIS — D259 Leiomyoma of uterus, unspecified: Secondary | ICD-10-CM

## 2011-07-20 DIAGNOSIS — N840 Polyp of corpus uteri: Secondary | ICD-10-CM | POA: Insufficient documentation

## 2011-07-20 DIAGNOSIS — I10 Essential (primary) hypertension: Secondary | ICD-10-CM | POA: Insufficient documentation

## 2011-07-20 DIAGNOSIS — Z01812 Encounter for preprocedural laboratory examination: Secondary | ICD-10-CM | POA: Insufficient documentation

## 2011-07-20 DIAGNOSIS — N841 Polyp of cervix uteri: Secondary | ICD-10-CM | POA: Insufficient documentation

## 2011-07-20 HISTORY — PX: HYSTEROSCOPY WITH D & C: SHX1775

## 2011-07-20 HISTORY — PX: POLYPECTOMY: SHX5525

## 2011-07-20 LAB — URINALYSIS, ROUTINE W REFLEX MICROSCOPIC
Bilirubin Urine: NEGATIVE
Ketones, ur: NEGATIVE mg/dL
Protein, ur: NEGATIVE mg/dL
Specific Gravity, Urine: 1.015 (ref 1.005–1.030)
Urobilinogen, UA: 0.2 mg/dL (ref 0.0–1.0)

## 2011-07-20 LAB — URINE MICROSCOPIC-ADD ON

## 2011-07-20 SURGERY — DILATATION AND CURETTAGE /HYSTEROSCOPY
Anesthesia: General | Site: Uterus | Wound class: Clean Contaminated

## 2011-07-20 MED ORDER — LIDOCAINE HCL (CARDIAC) 20 MG/ML IV SOLN
INTRAVENOUS | Status: DC | PRN
  Administered 2011-07-20: 60 mg via INTRAVENOUS

## 2011-07-20 MED ORDER — LIDOCAINE HCL (CARDIAC) 20 MG/ML IV SOLN
INTRAVENOUS | Status: AC
  Filled 2011-07-20: qty 5

## 2011-07-20 MED ORDER — GLYCOPYRROLATE 0.2 MG/ML IJ SOLN
INTRAMUSCULAR | Status: AC
  Filled 2011-07-20: qty 1

## 2011-07-20 MED ORDER — GLYCINE 1.5 % IR SOLN
Status: DC | PRN
  Administered 2011-07-20: 3000 mL

## 2011-07-20 MED ORDER — DEXAMETHASONE SODIUM PHOSPHATE 4 MG/ML IJ SOLN
INTRAMUSCULAR | Status: DC | PRN
  Administered 2011-07-20: 5 mg via INTRAVENOUS

## 2011-07-20 MED ORDER — PROPOFOL 10 MG/ML IV EMUL
INTRAVENOUS | Status: AC
  Filled 2011-07-20: qty 20

## 2011-07-20 MED ORDER — KETOROLAC TROMETHAMINE 60 MG/2ML IM SOLN
INTRAMUSCULAR | Status: AC
  Filled 2011-07-20: qty 2

## 2011-07-20 MED ORDER — FENTANYL CITRATE 0.05 MG/ML IJ SOLN
INTRAMUSCULAR | Status: AC
  Filled 2011-07-20: qty 2

## 2011-07-20 MED ORDER — LIDOCAINE HCL 1 % IJ SOLN
INTRAMUSCULAR | Status: DC | PRN
  Administered 2011-07-20: 10 mL

## 2011-07-20 MED ORDER — LACTATED RINGERS IV SOLN
INTRAVENOUS | Status: DC
  Administered 2011-07-20 (×2): via INTRAVENOUS

## 2011-07-20 MED ORDER — MIDAZOLAM HCL 5 MG/5ML IJ SOLN
INTRAMUSCULAR | Status: DC | PRN
  Administered 2011-07-20 (×2): 1 mg via INTRAVENOUS

## 2011-07-20 MED ORDER — PROPOFOL 10 MG/ML IV EMUL
INTRAVENOUS | Status: DC | PRN
  Administered 2011-07-20: 120 mg via INTRAVENOUS

## 2011-07-20 MED ORDER — EPHEDRINE SULFATE 50 MG/ML IJ SOLN
INTRAMUSCULAR | Status: DC | PRN
  Administered 2011-07-20 (×2): 5 mg via INTRAVENOUS

## 2011-07-20 MED ORDER — DEXAMETHASONE SODIUM PHOSPHATE 10 MG/ML IJ SOLN
INTRAMUSCULAR | Status: AC
  Filled 2011-07-20: qty 1

## 2011-07-20 MED ORDER — MIDAZOLAM HCL 2 MG/2ML IJ SOLN
INTRAMUSCULAR | Status: AC
  Filled 2011-07-20: qty 2

## 2011-07-20 MED ORDER — ONDANSETRON HCL 4 MG/2ML IJ SOLN
INTRAMUSCULAR | Status: AC
  Filled 2011-07-20: qty 2

## 2011-07-20 MED ORDER — FENTANYL CITRATE 0.05 MG/ML IJ SOLN
INTRAMUSCULAR | Status: DC | PRN
  Administered 2011-07-20 (×2): 50 ug via INTRAVENOUS

## 2011-07-20 MED ORDER — ONDANSETRON HCL 4 MG/2ML IJ SOLN
INTRAMUSCULAR | Status: DC | PRN
  Administered 2011-07-20: 4 mg via INTRAVENOUS

## 2011-07-20 MED ORDER — FENTANYL CITRATE 0.05 MG/ML IJ SOLN: 25.0000 ug | INTRAMUSCULAR | Status: DC | PRN

## 2011-07-20 MED ORDER — KETOROLAC TROMETHAMINE 30 MG/ML IJ SOLN
INTRAMUSCULAR | Status: DC | PRN
  Administered 2011-07-20: 30 mg via INTRAVENOUS

## 2011-07-20 SURGICAL SUPPLY — 19 items
CANISTER SUCTION 2500CC (MISCELLANEOUS) ×3 IMPLANT
CATH ROBINSON RED A/P 16FR (CATHETERS) ×3 IMPLANT
CLOTH BEACON ORANGE TIMEOUT ST (SAFETY) ×3 IMPLANT
CONTAINER PREFILL 10% NBF 60ML (FORM) ×12 IMPLANT
CORD ACTIVE DISPOSABLE (ELECTRODE)
CORD ELECTRO ACTIVE DISP (ELECTRODE) IMPLANT
ELECT LOOP GYNE PRO 24FR (CUTTING LOOP)
ELECT REM PT RETURN 9FT ADLT (ELECTROSURGICAL) ×3
ELECT VAPORTRODE GRVD BAR (ELECTRODE) IMPLANT
ELECTRODE LOOP GYNE PRO 24FR (CUTTING LOOP) IMPLANT
ELECTRODE REM PT RTRN 9FT ADLT (ELECTROSURGICAL) ×2 IMPLANT
GLOVE BIO SURGEON STRL SZ7.5 (GLOVE) ×6 IMPLANT
GOWN PREVENTION PLUS LG XLONG (DISPOSABLE) ×3 IMPLANT
GOWN PREVENTION PLUS XLARGE (GOWN DISPOSABLE) ×3 IMPLANT
GOWN STRL REIN XL XLG (GOWN DISPOSABLE) ×3 IMPLANT
LOOP ANGLED CUTTING 22FR (CUTTING LOOP) IMPLANT
PACK HYSTEROSCOPY LF (CUSTOM PROCEDURE TRAY) ×3 IMPLANT
TOWEL OR 17X24 6PK STRL BLUE (TOWEL DISPOSABLE) ×6 IMPLANT
WATER STERILE IRR 1000ML POUR (IV SOLUTION) ×3 IMPLANT

## 2011-07-20 NOTE — Discharge Instructions (Signed)
Report any temp > 100.4 degrees, call with any significant problems.DISCHARGE INSTRUCTIONS: D&C / D&E The following instructions have been prepared to help you care for yourself upon your return home.   Personal hygiene: [name] Use sanitary pads for vaginal drainage, not tampons. . Shower the day after your procedure. . NO tub baths, pools or Jacuzzis for 2-3 weeks. . Wipe front to back after using the bathroom.  Activity and limitations: . Do NOT drive or operate any equipment for 24 hours. The effects of anesthesia are still present and drowsiness may result. . Do NOT rest in bed all day. . Walking is encouraged. . Walk up and down stairs slowly. . You may resume your normal activity in one to two days or as indicated by your physician.  Sexual activity: NO intercourse for at least 2 weeks after the procedure, or as indicated by your physician.  Diet: Eat a light meal as desired this evening. You may resume your usual diet tomorrow.  Return to work: You may resume your work activities in one to two days or as indicated by your doctor.  What to expect after your surgery: Expect to have vaginal bleeding/discharge for 2-3 days and spotting for up to 10 days. It is not unusual to have soreness for up to 1-2 weeks. You may have a slight burning sensation when you urinate for the first day. Mild cramps may continue for a couple of days. You may have a regular period in 2-6 weeks.  Call your doctor for any of the following: . Excessive vaginal bleeding, saturating and changing one pad every hour. . Inability to urinate 6 hours after discharge from hospital. . Pain not relieved by pain medication. . Fever of 100.4 F or greater. . Unusual vaginal discharge or odor.  Return to office ________________ Call for an appointment ___________________  Patient's signature: ______________________  Nurse's signature ________________________  Post Anesthesia Care Unit [phone]

## 2011-07-20 NOTE — Anesthesia Postprocedure Evaluation (Signed)
Anesthesia Post Note  Patient: Monica [name]  Procedure(s) Performed: Procedure(s) (LRB): DILATATION AND CURETTAGE /HYSTEROSCOPY (N/A) POLYPECTOMY (N/A)  Anesthesia type: GA  Patient location: PACU  Post pain: Pain level controlled  Post assessment: Post-op Vital signs reviewed  Last Vitals:  Filed Vitals:   07/20/11 0830  BP: 125/82  Pulse: 68  Temp: 36.3 C  Resp: 16    Post vital signs: Reviewed  Level of consciousness: sedated  Complications: No apparent anesthesia complications

## 2011-07-20 NOTE — Anesthesia Preprocedure Evaluation (Signed)
Anesthesia Evaluation  Patient identified by MRN, date of birth, ID band Patient awake    Reviewed: Allergy & Precautions, H&P , Patient's Chart, lab work & pertinent test results, reviewed documented beta blocker date and time   Airway Mallampati: II TM Distance: >3 FB Neck ROM: full    Dental No notable dental hx.    Pulmonary  breath sounds clear to auscultation  Pulmonary exam normal       Cardiovascular hypertension (well cxontrolled . EKG okay), Pt. on medications Rhythm:regular Rate:Normal     Neuro/Psych    GI/Hepatic GERD-  Medicated and Controlled,  Endo/Other    Renal/GU      Musculoskeletal   Abdominal   Peds  Hematology   Anesthesia Other Findings   Reproductive/Obstetrics                           Anesthesia Physical Anesthesia Plan  ASA: II  Anesthesia Plan: General   Post-op Pain Management:    Induction: Intravenous  Airway Management Planned: LMA  Additional Equipment:   Intra-op Plan:   Post-operative Plan:   Informed Consent: I have reviewed the patients History and Physical, chart, labs and discussed the procedure including the risks, benefits and alternatives for the proposed anesthesia with the patient or authorized representative who has indicated his/her understanding and acceptance.   Dental Advisory Given  Plan Discussed with: CRNA and Surgeon  Anesthesia Plan Comments: (  Discussed  general anesthesia, including possible nausea, instrumentation of airway, sore throat,pulmonary aspiration, etc. I asked if the were any outstanding questions, or  concerns before we proceeded. )        Anesthesia Quick Evaluation

## 2011-07-20 NOTE — Discharge Summary (Signed)
Pt had D&C and hysteroscopy with removal of a large endometrial polyp and a smaller endocervical polyp.

## 2011-07-20 NOTE — Transfer of Care (Signed)
Immediate Anesthesia Transfer of Care Note  Patient: Monica [name]  Procedure(s) Performed: Procedure(s) (LRB): DILATATION AND CURETTAGE /HYSTEROSCOPY (N/A)  Patient Location: PACU  Anesthesia Type: General  Level of Consciousness: awake, alert  and oriented  Airway & Oxygen Therapy: Patient Spontanous Breathing and Patient connected to nasal cannula oxygen  Post-op Assessment: Report given to PACU RN and Post -op Vital signs reviewed and stable  Post vital signs: Reviewed and stable  Complications: No apparent anesthesia complications

## 2011-07-20 NOTE — Progress Notes (Signed)
Patient ID: Monica [name], female   DOB: 1941-05-10, 71 y.o.   MRN: [id number] I examined the pt on 07-19-11 and she reports no change in her health since that time.

## 2011-07-20 NOTE — Op Note (Signed)
Operative note on Monica [name]:  Date of the operation: 07/20/2011  Preoperative diagnosis: Multiple leiomyomata uteri, endometrial polyp  Postoperative diagnosis: Same with endocervical polyp  Operation: D&C, hysteroscopy, removal of endometrial and endocervical polyps  Operator: Ambrose Mantle  Anesthesia: Gen.  The patient was brought to the operating room and placed under satisfactory general anesthesia and placed in the dorsal lithotomy position. Exam revealed the uterus to be multinodular with fibroids and retroverted. The adnexa were free of masses. There was a small polyp extruding through the cervical os that I thought was endocervical in origin. The vulva vagina perineum and urethra were prepped with Betadine solution and a catheter was used to empty the bladder. A timeout was done.The area was then draped as a sterile field and the cervix was exposed.the uterus sounded to 8 cm posteriorly. I dilated the cervix so that it would admit the hysteroscope after I removed the endocervical polyp. I visualized the endometrial polyps coming from the posterior wall of the uterus. Using the polyp forceps the small ring forceps and the uterine dressing forceps I was able to debulk the polyps completely. There was a very large amount of tissue retrieved. I did a fractional D&C producing very minimal tissue and terminated the procedure. The machine registered a deficit of 410 cc but because of a defect in the tubing much of the deficit of glycine was on the floor. Blood loss was minimal, sponge and needle counts were correct, and the patient was returned to recovery in satisfactory condition

## 2011-07-20 NOTE — Anesthesia Procedure Notes (Signed)
Procedure Name: LMA Insertion Date/Time: 07/20/2011 7:39 AM Performed by: Karleen Dolphin Pre-anesthesia Checklist: Suction available, Timeout performed, Emergency Drugs available, Patient identified and Patient being monitored Patient Re-evaluated:Patient Re-evaluated prior to inductionOxygen Delivery Method: Circle system utilized Preoxygenation: Pre-oxygenation with 100% oxygen Intubation Type: IV induction Ventilation: Mask ventilation without difficulty LMA: LMA inserted LMA Size: 4.0 Number of attempts: 1 Placement Confirmation: breath sounds checked- equal and bilateral and positive ETCO2 Tube secured with: Tape Dental Injury: Teeth and Oropharynx as per pre-operative assessment

## 2011-07-23 ENCOUNTER — Encounter (HOSPITAL_COMMUNITY): Payer: Self-pay | Admitting: Obstetrics and Gynecology

## 2012-03-03 ENCOUNTER — Ambulatory Visit (HOSPITAL_COMMUNITY)
Admission: RE | Admit: 2012-03-03 | Discharge: 2012-03-03 | Disposition: A | Discharge status: Home / Self Care | Payer: Medicare Other | Source: Ambulatory Visit | Attending: Orthopedic Surgery | Admitting: Orthopedic Surgery

## 2012-03-03 DIAGNOSIS — M6281 Muscle weakness (generalized): Secondary | ICD-10-CM | POA: Insufficient documentation

## 2012-03-03 DIAGNOSIS — IMO0001 Reserved for inherently not codable concepts without codable children: Secondary | ICD-10-CM | POA: Insufficient documentation

## 2012-03-03 DIAGNOSIS — M25619 Stiffness of unspecified shoulder, not elsewhere classified: Secondary | ICD-10-CM | POA: Insufficient documentation

## 2012-03-03 DIAGNOSIS — M25519 Pain in unspecified shoulder: Secondary | ICD-10-CM | POA: Insufficient documentation

## 2012-03-03 DIAGNOSIS — M7500 Adhesive capsulitis of unspecified shoulder: Secondary | ICD-10-CM | POA: Insufficient documentation

## 2012-03-03 NOTE — Evaluation (Addendum)
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 Hypertension   . GERD (gastroesophageal reflux disease)    Past Surgical History:  Past Surgical History  Procedure Date  . Tubal ligation   . Back surgery   . Hemorroidectomy   . Rotator cuff repair     right   . Hysteroscopy w/d&c 07/20/2011    Procedure: DILATATION AND CURETTAGE /HYSTEROSCOPY;  Surgeon: Monica Plume, MD;  Location: [facility];  Service: Gynecology;  Laterality: N/A;  . Polypectomy 07/20/2011    Procedure: POLYPECTOMY;  Surgeon: Monica Plume, MD;  Location: [facility];  Service: Gynecology;  Laterality: N/A;    Subjective S:  I want to be able to do everything I was doing without difficulty. Pertinent History: Monica [name] began experiencing increased pain and decreased mobility in her left shouder in June of 2013.  She consulted with her family MD and had an xray, which was negative.  She recieved a cortisone injection in her left shoulder, which did not alleviate her symptoms.  She had surgery on 12/18/11 for arthroscopy for frozen shoulder.  She has been referred to occupational therapy for evaluation and treatment. Special Tests: UEFI 20/80+ 25% I level Patient Stated Goals: " I want to do everything I was doing before this happened." Pain Assessment Currently in Pain?: Yes Pain Score:   6 Pain Location: Shoulder Pain Orientation: Left Pain Type: Acute pain  Precautions/Restrictions    Precautions Precautions: None  Prior Functioning  Home Living Lives With: Alone Prior Function Level of Independence: Independent with basic ADLs;Independent with homemaking with ambulation Driving: Yes Vocation: Retired Leisure: Hobbies-yes (Comment) Comments: Enjoys walking, silver sneaker program at [facility] and sewing  Assessment ADL/Vision/Perception ADL ADL Comments: Difficulty using her left arm to curl her hair, reach overhead, reach behind her back, lift anything heavy, or weightbear on her left arm. Dominant Hand: Right Vision - History Baseline Vision: Wears glasses all the time  Cognition/Observation Cognition Overall Cognitive Status: Appears within functional limits for tasks assessed Observation/Other Assessments Observations: scapular stability is fair in left shoulder  Sensation/Coordination/Edema Sensation Light Touch: Appears Intact Coordination Gross Motor Movements are Fluid and Coordinated: Yes Fine Motor Movements are Fluid and Coordinated: Yes  Additional Assessments LUE AROM (degrees) LUE Overall AROM Comments: assessed in seated ER/IR with shoulder adducted Left Shoulder Flexion: 105 Degrees Left Shoulder ABduction: 90 Degrees Left Shoulder Internal Rotation: 65 Degrees Left Shoulder External Rotation: 35 Degrees LUE PROM (degrees) LUE Overall PROM Comments: Assessed in supine, 75% LUE Strength LUE Overall Strength Comments: assessed in seated Left Shoulder Flexion: 4/5 Left Shoulder ABduction: 4/5 Left Shoulder Internal Rotation: 4/5 Left Shoulder External Rotation: 4/5 Palpation Palpation: Moderate fascial restrictions in upper and mid trap, scapular region, and upper arm.     Exercise/Treatments    Manual Therapy Manual Therapy: Myofascial release Myofascial Release: MFR to left upper arm, scapular, upper and mid trapezius region with manual cervcial traction.  PROM to left shoulder in supine through all available  ranges.    Occupational Therapy Assessment and Plan OT Assessment and Plan Clinical Impression Statement: A:  71 year old female with decreased AROM and strength and increased pain and restrictions in her left shoulder s/p surgery for frozen shoulder.  These deficits are affecting her ability to complete her daily activities.   Pt will benefit from skilled therapeutic intervention in order to improve on the following deficits: Decreased range of motion;Decreased strength;Increased muscle spasms;Increased fascial restricitons;Impaired UE functional use;Pain Rehab Potential: Good OT Frequency: Min 2X/week OT Duration: 6 weeks OT Treatment/Interventions: Self-care/ADL training;Therapeutic exercise;Therapeutic activities;Manual therapy;Modalities;Patient/family education OT Plan: P:  Skilled OT intervention to decrease pain and restrictions and increase A/PROM and strength, and scapular stability to WNL for increased I with all daily and leisure activities.  Treatment Plan:  MFR and manual stretchidng to left shoulder and cervical region as outlined above.  PROM  and AAROM in supine.  Seated active scapular stability exercises, ball stretches, prot/ret//elev/dep, thumbacks, pulleys.  Educate on cervical stretches and progress as tolerated.     Goals Short Term Goals Time to Complete Short Term Goals: 3 weeks Short Term Goal 1: Patient will be educated on a HEP. Short Term Goal 2: Patient will increase PROM in left shoulder to [facility] for increased ability to reach to curl her hair. Short Term Goal 3: Patient will increase left shoulder strength to 4+/5 for increased ability to lift bags of groceries. Short Term Goal 4: Patient will decrease left shoulder fascial restrictions to min-mod for increased abilty to reach overhead. Short Term Goal 5: Patient will decrease pain level to 4/10 when weightbearing on her left arm.  Long Term Goals Time to Complete Long Term Goals: 6 weeks Long Term Goal 1: Patient will  return to prior level of I with all B/IADLs and leisure activities. Long Term Goal 2: Patient will increase AROM in her left shoulder to WNL for increased ability to reach into overhead cabinet. Long Term Goal 3: Patient will increase left shoulder strength to 5/5 for increased ability to lift bags of groceries. Long Term Goal 4: Patient will decrease pain in her left shoulder to 2/10 when fixing her hair. Long Term Goal 5: Patient will decrease fascial restrictions in her left shoulder to trace. Additional Long Term Goals?: Yes Long Term Goal 6: Patient will increase scapular stability to normal in her left shoulder.  Problem List Patient Active Problem List  Diagnosis  . HELICOBACTER PYLORI INFECTION  . FIBROIDS, UTERUS  . MIXED HYPERLIPIDEMIA  . ANXIETY  . DEPRESSION, SITUATIONAL  . GLAUCOMA  . ESSENTIAL HYPERTENSION  . ESOPHAGEAL STRICTURE  . GERD  . HIATAL HERNIA  . CONSTIPATION  . IRRITABLE BOWEL SYNDROME  . DYSPAREUNIA  . ARTHRITIS  . LOW BACK PAIN, CHRONIC  . OSTEOPENIA  . DYSPHAGIA UNSPECIFIED  . CERVICAL STRAIN  . LUMBAR STRAIN  . COLONIC POLYPS, ADENOMATOUS, HX OF  . Chronic constipation  . GERD (gastroesophageal reflux disease)  . Leukopenia  . Pain in joint, shoulder region  . Muscle weakness (generalized)  . Frozen shoulder    End of Session Activity Tolerance: Patient tolerated treatment well General Behavior During Session: [facility] for tasks performed Cognition: [facility] for tasks performed OT Plan of Care OT Home Exercise Plan: Educated on HEP for shoulder stretches and dowel rod exercises.  GO Functional Assessment Tool Used: UEFI scored 25% I, 75% impaired Functional Limitation: Carrying, moving and handling objects Carrying, Moving and Handling Objects Current Status ([id number]): At least 60 percent but  less than 80 percent impaired, limited or restricted Carrying, Moving and Handling Objects Goal Status [phone]): At least 1 percent but less than 20 percent  impaired, limited or restricted  Shirlean Mylar, OTR/L  03/03/2012, 11:02 AM  Physician Documentation Your signature is required to indicate approval of the treatment plan as stated above.  Please sign and either send electronically or make a copy of this report for your files and return this physician signed original.  Please mark one 1.__approve of plan  2. ___approve of plan with the following conditions.   ______________________________                                                          _____________________ Physician Signature                                                                                                             Date

## 2012-03-05 ENCOUNTER — Ambulatory Visit (HOSPITAL_COMMUNITY)
Admission: RE | Admit: 2012-03-05 | Discharge: 2012-03-05 | Disposition: A | Discharge status: Home / Self Care | Payer: Medicare Other | Source: Ambulatory Visit | Attending: Orthopedic Surgery | Admitting: Orthopedic Surgery

## 2012-03-05 DIAGNOSIS — M25519 Pain in unspecified shoulder: Secondary | ICD-10-CM

## 2012-03-05 DIAGNOSIS — M7500 Adhesive capsulitis of unspecified shoulder: Secondary | ICD-10-CM

## 2012-03-05 DIAGNOSIS — M6281 Muscle weakness (generalized): Secondary | ICD-10-CM

## 2012-03-05 NOTE — Progress Notes (Signed)
Occupational Therapy Treatment Patient Details  Name: Monica [name] MRN: [id number] Date of Birth: Jan 12, 1941  Today's Date: 03/05/2012 Time: 1305-1400 OT Time Calculation (min): 55 min Manual Therapy 105-128 23' Therapeutic Exercise 129-200 31'  Visit#: 2  of 12   Re-eval: 03/31/12    Authorization: Medicare   Authorization Time Period: before 10th visit   Authorization Visit#: 2  of 10   Subjective Symptoms/Limitations Symptoms: S:  This arm is giving me a fit. Pain Assessment Currently in Pain?: Yes Pain Score:   4 Pain Orientation: Left  Precautions/Restrictions     Exercise/Treatments Supine Protraction: PROM;AAROM;10 reps Horizontal ABduction: PROM;AAROM;10 reps External Rotation: PROM;AAROM;10 reps Internal Rotation: PROM;AAROM;10 reps Flexion: PROM;AAROM;10 reps ABduction: PROM;AAROM;10 reps Seated Elevation: AROM;10 reps Retraction: AROM;10 reps Row: AROM;10 reps Pulleys Flexion: 2 minutes ABduction: 2 minutes     Manual Therapy Manual Therapy: Myofascial release Myofascial Release: MFR to left upper arm, scapular, upper and mid trapezius region with manual cervcial traction. PROM to left shoulder in supine through all available ranges  Occupational Therapy Assessment and Plan OT Assessment and Plan Clinical Impression Statement: A:  Added multiple new exercises, AAROM, ball stretches, scapular AROM and pulleys, which caused her pain/discomfort to increase slightly (5/10). Provided heat pack which did not resolve the pain.  Patient stated she feltt it would ease off, and that it gets painful like this sometimes OT Plan: P:  Add prot/ret/elev/dep, thumbtacks. Educate on cervical stretches.   Goals Short Term Goals Time to Complete Short Term Goals: 3 weeks Short Term Goal 1: Patient will be educated on a HEP. Short Term Goal 2: Patient will increase PROM in left shoulder to [facility] for increased ability to reach to curl her hair. Short Term Goal 3:  Patient will increase left shoulder strength to 4+/5 for increased ability to lift bags of groceries. Short Term Goal 4: Patient will decrease left shoulder fascial restrictions to min-mod for increased abilty to reach overhead. Short Term Goal 5: Patient will decrease pain level to 4/10 when weightbearing on her left arm.   Problem List Patient Active Problem List  Diagnosis  . HELICOBACTER PYLORI INFECTION  . FIBROIDS, UTERUS  . MIXED HYPERLIPIDEMIA  . ANXIETY  . DEPRESSION, SITUATIONAL  . GLAUCOMA  . ESSENTIAL HYPERTENSION  . ESOPHAGEAL STRICTURE  . GERD  . HIATAL HERNIA  . CONSTIPATION  . IRRITABLE BOWEL SYNDROME  . DYSPAREUNIA  . ARTHRITIS  . LOW BACK PAIN, CHRONIC  . OSTEOPENIA  . DYSPHAGIA UNSPECIFIED  . CERVICAL STRAIN  . LUMBAR STRAIN  . COLONIC POLYPS, ADENOMATOUS, HX OF  . Chronic constipation  . GERD (gastroesophageal reflux disease)  . Leukopenia  . Pain in joint, shoulder region  . Muscle weakness (generalized)  . Frozen shoulder    End of Session Activity Tolerance: Patient tolerated treatment well General Behavior During Session: [facility] for tasks performed Cognition: [facility] for tasks performed  GO   Edger Husain L. Manas Hickling, COTA/L  03/05/2012, 3:36 PM

## 2012-03-10 ENCOUNTER — Ambulatory Visit (HOSPITAL_COMMUNITY)
Admission: RE | Admit: 2012-03-10 | Discharge: 2012-03-10 | Disposition: A | Discharge status: Home / Self Care | Payer: Medicare Other | Source: Ambulatory Visit | Attending: Orthopedic Surgery | Admitting: Orthopedic Surgery

## 2012-03-10 DIAGNOSIS — M7500 Adhesive capsulitis of unspecified shoulder: Secondary | ICD-10-CM

## 2012-03-10 DIAGNOSIS — M25519 Pain in unspecified shoulder: Secondary | ICD-10-CM

## 2012-03-10 DIAGNOSIS — M6281 Muscle weakness (generalized): Secondary | ICD-10-CM

## 2012-03-10 NOTE — Progress Notes (Signed)
Occupational Therapy Treatment Patient Details  Name: Monica [name] MRN: [id number] Date of Birth: [birth year]  Today's Date: 03/10/2012 Time: 1308-6578 OT Time Calculation (min): 58 min Manual Therapy 850-915 25' Therapeutic Exercises [phone] 21' Visit#: 3  of 12   Re-eval: 03/31/12    Authorization: Medicare  Authorization Time Period: before 10th visit   Authorization Visit#: 3  of 12   Subjective S:  It was really sore in the joint over the weekend, but what you do makes it feel better. Pain Assessment Currently in Pain?: Yes Pain Score:   3 Pain Location: Shoulder Pain Orientation: Left Pain Type: Acute pain  Precautions/Restrictions   N/A  Exercise/Treatments Supine Protraction: PROM;10 reps;AAROM;12 reps Horizontal ABduction: PROM;10 reps;AAROM;12 reps External Rotation: PROM;10 reps;AAROM;12 reps Internal Rotation: PROM;10 reps;AAROM;12 reps Flexion: PROM;10 reps;AAROM;12 reps ABduction: PROM;10 reps;AAROM;12 reps Seated Elevation: AROM;12 reps Extension: AROM;12 reps Retraction: AROM;12 reps Row: AROM;12 reps Pulleys Flexion: 3 minutes ABduction: 3 minutes Therapy Ball Flexion: 20 reps ABduction: 20 reps ROM / Strengthening / Isometric Strengthening Thumb Tacks: 1' Prot/Ret//Elev/Dep: 1'      Manual Therapy Manual Therapy: Myofascial release Myofascial Release: MFR to left upper arm, scapular, upper and mid trapezius region with manual cervcial traction. PROM to left shoulder in supine through all available ranges 850-915  Occupational Therapy Assessment and Plan OT Assessment and Plan Clinical Impression Statement: A:  PROM and AAROM 75% in supine.  Added thumbtacks and prot/ret//elev/dep and ball stretches to increase ROM and scapular stability.  Min vg for positioning during scapular stability exercises. OT Plan: P:  Educate on cervical stretches.  Decrease cuing required for positioning with scapular stability exercises.   Goals Short Term  Goals Time to Complete Short Term Goals: 3 weeks Short Term Goal 1: Patient will be educated on a HEP. Short Term Goal 1 Progress: Progressing toward goal Short Term Goal 2: Patient will increase PROM in left shoulder to [facility] for increased ability to reach to curl her hair. Short Term Goal 2 Progress: Progressing toward goal Short Term Goal 3: Patient will increase left shoulder strength to 4+/5 for increased ability to lift bags of groceries. Short Term Goal 3 Progress: Progressing toward goal Short Term Goal 4: Patient will decrease left shoulder fascial restrictions to min-mod for increased abilty to reach overhead. Short Term Goal 4 Progress: Progressing toward goal Short Term Goal 5: Patient will decrease pain level to 4/10 when weightbearing on her left arm.  Short Term Goal 5 Progress: Progressing toward goal Long Term Goals Long Term Goal 1: Patient will return to prior level of I with all B/IADLs and leisure activities Long Term Goal 1 Progress: Progressing toward goal Long Term Goal 2: Patient will increase AROM in her left shoulder to WNL for increased ability to reach into overhead cabinet. Long Term Goal 2 Progress: Progressing toward goal Long Term Goal 3: Patient will increase left shoulder strength to 5/5 for increased ability to lift bags of groceries.  Long Term Goal 3 Progress: Progressing toward goal Long Term Goal 4: Patient will decrease pain in her left shoulder to 2/10 when fixing her hair.  Long Term Goal 4 Progress: Progressing toward goal Long Term Goal 5: Patient will decrease fascial restrictions in her left shoulder to trace.  Long Term Goal 5 Progress: Progressing toward goal  Problem List Patient Active Problem List  Diagnosis  . HELICOBACTER PYLORI INFECTION  . FIBROIDS, UTERUS  . MIXED HYPERLIPIDEMIA  . ANXIETY  . DEPRESSION, SITUATIONAL  . GLAUCOMA  .  ESSENTIAL HYPERTENSION  . ESOPHAGEAL STRICTURE  . GERD  . HIATAL HERNIA  . CONSTIPATION  .  IRRITABLE BOWEL SYNDROME  . DYSPAREUNIA  . ARTHRITIS  . LOW BACK PAIN, CHRONIC  . OSTEOPENIA  . DYSPHAGIA UNSPECIFIED  . CERVICAL STRAIN  . LUMBAR STRAIN  . COLONIC POLYPS, ADENOMATOUS, HX OF  . Chronic constipation  . GERD (gastroesophageal reflux disease)  . Leukopenia  . Pain in joint, shoulder region  . Muscle weakness (generalized)  . Frozen shoulder    End of Session Activity Tolerance: Patient tolerated treatment well General Behavior During Session: [facility] for tasks performed Cognition: [facility] for tasks performed  GO    Shirlean Mylar, OTR/L  03/10/2012, 10:04 AM

## 2012-03-14 ENCOUNTER — Ambulatory Visit (HOSPITAL_COMMUNITY)
Admission: RE | Admit: 2012-03-14 | Discharge: 2012-03-14 | Disposition: A | Discharge status: Home / Self Care | Payer: Medicare Other | Source: Ambulatory Visit | Attending: Orthopedic Surgery | Admitting: Orthopedic Surgery

## 2012-03-14 DIAGNOSIS — M25519 Pain in unspecified shoulder: Secondary | ICD-10-CM | POA: Insufficient documentation

## 2012-03-14 DIAGNOSIS — M6281 Muscle weakness (generalized): Secondary | ICD-10-CM | POA: Insufficient documentation

## 2012-03-14 DIAGNOSIS — IMO0001 Reserved for inherently not codable concepts without codable children: Secondary | ICD-10-CM | POA: Insufficient documentation

## 2012-03-14 DIAGNOSIS — M25619 Stiffness of unspecified shoulder, not elsewhere classified: Secondary | ICD-10-CM | POA: Insufficient documentation

## 2012-03-14 NOTE — Progress Notes (Signed)
Occupational Therapy Treatment Patient Details  Name: Monica [name] MRN: [id number] Date of Birth: 08-21-1940  Today's Date: 03/14/2012 Time: 4098-1191 OT Time Calculation (min): 57 min Manual Therapy 478-295 22' Therapeutic Exercises 910-944 34' Visit#: 4  of 12   Re-eval: 03/31/12    Authorization: Medicare  Authorization Time Period: before 10th visit  Authorization Visit#: 4  of 10   Subjective S:  Its sore when I carry my pocketbook on that left side. Pain Assessment Currently in Pain?: Yes Pain Score:   1 Pain Location: Shoulder Pain Orientation: Left Pain Type: Acute pain  Precautions/Restrictions   Progress as tolerated  Exercise/Treatments Supine Protraction: PROM;AROM;10 reps Horizontal ABduction: PROM;AROM;10 reps External Rotation: PROM;AROM;10 reps Internal Rotation: PROM;AROM;10 reps Flexion: PROM;AROM;10 reps ABduction: PROM;AROM;10 reps Seated Elevation: AROM;15 reps Extension: AROM;15 reps Retraction: AROM;15 reps Row: AROM;15 reps Protraction: AAROM;10 reps Horizontal ABduction: AAROM;10 reps External Rotation: AAROM;10 reps Internal Rotation: AAROM;10 reps Flexion: AAROM;10 reps Abduction: AAROM;10 reps Pulleys Flexion:  (dc pulley exercises this date) Therapy Ball Flexion: 25 reps ABduction: 25 reps Right/Left: 5 reps ROM / Strengthening / Isometric Strengthening Wall Wash: 1' Thumb Tacks: 1' Prot/Ret//Elev/Dep: 1'        Manual Therapy Manual Therapy: Joint mobilization Joint Mobilization: supine mobilizations to the left shoulder and scapula to increase pain free mobility Myofascial Release: MFR to left upper arm, scapular, upper and mid trapezius region with manual cervcial traction. PROM to left shoulder in supine through all available ranges   Occupational Therapy Assessment and Plan OT Assessment and Plan Clinical Impression Statement: A:  PROM and AAROM WFL in supine, therefore increased to AROM in supine and AAROM in  seated.  DC pulleys and added wall wash.  OT Plan: P:  Increase to AROM in supine, add x to v and w arms.   Goals Short Term Goals Time to Complete Short Term Goals: 3 weeks Short Term Goal 1: Patient will be educated on a HEP. Short Term Goal 2: Patient will increase PROM in left shoulder to [facility] for increased ability to reach to curl her hair. Short Term Goal 3: Patient will increase left shoulder strength to 4+/5 for increased ability to lift bags of groceries. Short Term Goal 4: Patient will decrease left shoulder fascial restrictions to min-mod for increased abilty to reach overhead. Short Term Goal 5: Patient will decrease pain level to 4/10 when weightbearing on her left arm.  Long Term Goals Long Term Goal 1: Patient will return to prior level of I with all B/IADLs and leisure activities Long Term Goal 2: Patient will increase AROM in her left shoulder to WNL for increased ability to reach into overhead cabinet. Long Term Goal 3: Patient will increase left shoulder strength to 5/5 for increased ability to lift bags of groceries.  Long Term Goal 4: Patient will decrease pain in her left shoulder to 2/10 when fixing her hair.  Long Term Goal 5: Patient will decrease fascial restrictions in her left shoulder to trace.   Problem List Patient Active Problem List  Diagnosis  . HELICOBACTER PYLORI INFECTION  . FIBROIDS, UTERUS  . MIXED HYPERLIPIDEMIA  . ANXIETY  . DEPRESSION, SITUATIONAL  . GLAUCOMA  . ESSENTIAL HYPERTENSION  . ESOPHAGEAL STRICTURE  . GERD  . HIATAL HERNIA  . CONSTIPATION  . IRRITABLE BOWEL SYNDROME  . DYSPAREUNIA  . ARTHRITIS  . LOW BACK PAIN, CHRONIC  . OSTEOPENIA  . DYSPHAGIA UNSPECIFIED  . CERVICAL STRAIN  . LUMBAR STRAIN  . COLONIC POLYPS,  ADENOMATOUS, HX OF  . Chronic constipation  . GERD (gastroesophageal reflux disease)  . Leukopenia  . Pain in joint, shoulder region  . Muscle weakness (generalized)  . Frozen shoulder    End of  Session Activity Tolerance: Patient tolerated treatment well General Behavior During Session: [facility] for tasks performed Cognition: [facility] for tasks performed OT Plan of Care OT Home Exercise Plan: educated on AROM this date.    Shirlean Mylar, OTR/L  03/14/2012, 9:50 AM

## 2012-03-17 ENCOUNTER — Ambulatory Visit (HOSPITAL_COMMUNITY)
Admission: RE | Admit: 2012-03-17 | Discharge: 2012-03-17 | Disposition: A | Discharge status: Home / Self Care | Payer: Medicare Other | Source: Ambulatory Visit | Attending: Orthopedic Surgery | Admitting: Orthopedic Surgery

## 2012-03-17 DIAGNOSIS — M25519 Pain in unspecified shoulder: Secondary | ICD-10-CM

## 2012-03-17 DIAGNOSIS — M7500 Adhesive capsulitis of unspecified shoulder: Secondary | ICD-10-CM

## 2012-03-17 DIAGNOSIS — M6281 Muscle weakness (generalized): Secondary | ICD-10-CM

## 2012-03-17 NOTE — Progress Notes (Signed)
Occupational Therapy Treatment Patient Details  Name: Monica [name] AMENT MRN: [id number] Date of Birth: 1940-11-04  Today's Date: 03/17/2012 Time: 7829-5621 OT Time Calculation (min): 54 min Manual Therapy 850-915 25' Therapeutic Exercise [phone] 28'  Visit#: 5  of 12   Re-eval: 03/31/12    Authorization: Medicare  Authorization Time Period: before 10th visit   Authorization Visit#: 5  of 10   Subjective Symptoms/Limitations Symptoms: S:  I hurt my back saturday, the last thing I remember is making my bed. Pain Assessment Currently in Pain?: Yes Pain Score:   1 Pain Location: Shoulder Pain Orientation: Left Pain Type: Acute pain  Precautions/Restrictions     Exercise/Treatments Supine Protraction: PROM;AROM;12 reps Horizontal ABduction: PROM;AROM;12 reps External Rotation: PROM;AROM;12 reps Internal Rotation: PROM;AROM;12 reps Flexion: PROM;AROM;12 reps ABduction: PROM;AROM;12 reps Seated Elevation: AROM;15 reps Extension: AROM;15 reps Retraction: AROM;15 reps Row: AROM;15 reps Protraction: AAROM;12 reps Horizontal ABduction: AAROM;12 reps External Rotation: AAROM;12 reps Internal Rotation: AAROM;12 reps Flexion: AAROM;12 reps Abduction: AAROM;12 reps Therapy Ball Flexion: 25 reps ABduction: 25 reps Right/Left: 5 reps ROM / Strengthening / Isometric Strengthening Wall Wash: 1' Thumb Tacks: 1 "W" Arms: x10 X to V Arms: x10 Prot/Ret//Elev/Dep: 1          Manual Therapy Manual Therapy: Myofascial release Myofascial Release: MFR to left upper arm, scapular, upper and mid trapezius region with manual cervcial traction. PROM to left shoulder in supine through all available ranges  Occupational Therapy Assessment and Plan OT Assessment and Plan Clinical Impression Statement: A:  Added x to v and w arms in seated.  Patient did compete all exercises but did not increase resistance or reps other that supine ex ,where her back did not hurt, so as not to  aggrivate her back or increase her pain. OT Plan: P: Increase time with wall wash if patient can tolerate.   Goals Short Term Goals Time to Complete Short Term Goals: 3 weeks Short Term Goal 1: Patient will be educated on a HEP. Short Term Goal 2: Patient will increase PROM in left shoulder to [facility] for increased ability to reach to curl her hair. Short Term Goal 3: Patient will increase left shoulder strength to 4+/5 for increased ability to lift bags of groceries. Short Term Goal 4: Patient will decrease left shoulder fascial restrictions to min-mod for increased abilty to reach overhead. Short Term Goal 5: Patient will decrease pain level to 4/10 when weightbearing on her left arm.  Long Term Goals Long Term Goal 1: Patient will return to prior level of I with all B/IADLs and leisure activities Long Term Goal 2: Patient will increase AROM in her left shoulder to WNL for increased ability to reach into overhead cabinet. Long Term Goal 3: Patient will increase left shoulder strength to 5/5 for increased ability to lift bags of groceries.  Long Term Goal 4: Patient will decrease pain in her left shoulder to 2/10 when fixing her hair.  Long Term Goal 5: Patient will decrease fascial restrictions in her left shoulder to trace.   Problem List Patient Active Problem List  Diagnosis  . HELICOBACTER PYLORI INFECTION  . FIBROIDS, UTERUS  . MIXED HYPERLIPIDEMIA  . ANXIETY  . DEPRESSION, SITUATIONAL  . GLAUCOMA  . ESSENTIAL HYPERTENSION  . ESOPHAGEAL STRICTURE  . GERD  . HIATAL HERNIA  . CONSTIPATION  . IRRITABLE BOWEL SYNDROME  . DYSPAREUNIA  . ARTHRITIS  . LOW BACK PAIN, CHRONIC  . OSTEOPENIA  . DYSPHAGIA UNSPECIFIED  . CERVICAL STRAIN  .  LUMBAR STRAIN  . COLONIC POLYPS, ADENOMATOUS, HX OF  . Chronic constipation  . GERD (gastroesophageal reflux disease)  . Leukopenia  . Pain in joint, shoulder region  . Muscle weakness (generalized)  . Frozen shoulder    End of  Session Activity Tolerance: Patient tolerated treatment well General Behavior During Session: [facility] for tasks performed Cognition: [facility] for tasks performed  GO    Noralee Stain, Awa Bachicha L 03/17/2012, 9:56 AM

## 2012-03-19 ENCOUNTER — Ambulatory Visit (HOSPITAL_COMMUNITY)
Admission: RE | Admit: 2012-03-19 | Discharge: 2012-03-19 | Disposition: A | Discharge status: Home / Self Care | Payer: Medicare Other | Source: Ambulatory Visit | Attending: Orthopedic Surgery | Admitting: Orthopedic Surgery

## 2012-03-19 DIAGNOSIS — M25519 Pain in unspecified shoulder: Secondary | ICD-10-CM

## 2012-03-19 DIAGNOSIS — M7500 Adhesive capsulitis of unspecified shoulder: Secondary | ICD-10-CM

## 2012-03-19 DIAGNOSIS — M6281 Muscle weakness (generalized): Secondary | ICD-10-CM

## 2012-03-19 NOTE — Progress Notes (Signed)
Occupational Therapy Treatment Patient Details  Name: Monica [name] MRN: [id number] Date of Birth: Oct 05, 1940  Today's Date: 03/19/2012 Time: 1478-2956 OT Time Calculation (min): 37 min Manual Therapy 213-086 18' ROM/MMT assessment [phone]  Visit#: 6  of 12   Re-eval: 04/16/12    Authorization: Medicare  Authorization Time Period: before 16th visit  Authorization Visit#: 6  of 16   Subjective S:  I can fix my hair with my left arm now.   Pain Assessment Currently in Pain?: Yes Pain Score:   1 Pain Location: Shoulder Pain Orientation: Left Pain Type: Acute pain  Precautions/Restrictions   progress as tolerated  Exercise/Treatments Supine Protraction: PROM;10 reps;AROM;15 reps Horizontal ABduction: PROM;10 reps;AROM;15 reps External Rotation: PROM;10 reps;AROM;15 reps Internal Rotation: PROM;10 reps;AROM;15 reps Flexion: PROM;10 reps;AROM;15 reps ABduction: PROM;10 reps;AROM;15 reps    Manual Therapy Manual Therapy: Myofascial release Myofascial Release: MFR to left upper arm, scapular, upper and mid trapezius region with manual cervcial traction. PROM to left shoulder in supine through all available ranges [phone]  Occupational Therapy Assessment and Plan OT Assessment and Plan Clinical Impression Statement: A:  Please refer to monthly progress note for details.  Monica [name] declined therapeutic exercises this date due to back pain. OT Frequency: Min 2X/week OT Duration: 6 weeks OT Plan: P:  Resume exercises.  Begin strengthening exercises in supine, as A/PROM is Monica [name].     Goals Short Term Goals Time to Complete Short Term Goals: 3 weeks Short Term Goal 1: Patient will be educated on a HEP. Short Term Goal 1 Progress: Met Short Term Goal 2: Patient will increase PROM in left shoulder to [facility] for increased ability to reach to curl her hair. Short Term Goal 2 Progress: Met Short Term Goal 3: Patient will increase left shoulder strength to 4+/5 for increased ability  to lift bags of groceries. Short Term Goal 3 Progress: Met Short Term Goal 4: Patient will decrease left shoulder fascial restrictions to min-mod for increased abilty to reach overhead. Short Term Goal 4 Progress: Met Short Term Goal 5: Patient will decrease pain level to 4/10 when weightbearing on her left arm.  Short Term Goal 5 Progress: Met Long Term Goals Long Term Goal 1: Patient will return to prior level of I with all B/IADLs and leisure activities Long Term Goal 1 Progress: Progressing toward goal Long Term Goal 2: Patient will increase AROM in her left shoulder to WNL for increased ability to reach into overhead cabinet. Long Term Goal 2 Progress: Progressing toward goal Long Term Goal 3: Patient will increase left shoulder strength to 5/5 for increased ability to lift bags of groceries.  Long Term Goal 3 Progress: Progressing toward goal Long Term Goal 4: Patient will decrease pain in her left shoulder to 2/10 when fixing her hair.  Long Term Goal 4 Progress: Progressing toward goal Long Term Goal 5: Patient will decrease fascial restrictions in her left shoulder to trace.  Long Term Goal 5 Progress: Progressing toward goal  Problem List Patient Active Problem List  Diagnosis  . HELICOBACTER PYLORI INFECTION  . FIBROIDS, UTERUS  . MIXED HYPERLIPIDEMIA  . ANXIETY  . DEPRESSION, SITUATIONAL  . GLAUCOMA  . ESSENTIAL HYPERTENSION  . ESOPHAGEAL STRICTURE  . GERD  . HIATAL HERNIA  . CONSTIPATION  . IRRITABLE BOWEL SYNDROME  . DYSPAREUNIA  . ARTHRITIS  . LOW BACK PAIN, CHRONIC  . OSTEOPENIA  . DYSPHAGIA UNSPECIFIED  . CERVICAL STRAIN  . LUMBAR STRAIN  . COLONIC POLYPS, ADENOMATOUS, HX OF  .  Chronic constipation  . GERD (gastroesophageal reflux disease)  . Leukopenia  . Pain in joint, shoulder region  . Muscle weakness (generalized)  . Frozen shoulder    End of Session Activity Tolerance: Patient tolerated treatment well General Behavior During Session: Monica [name] for  tasks performed Cognition: [facility] for tasks performed  GO Functional Assessment Tool Used: UEFI scored 67% I level (33% impairment level), previously was 25% I level Functional Limitation: Carrying, moving and handling objects Carrying, Moving and Handling Objects Current Status ([id number]): At least 20 percent but less than 40 percent impaired, limited or restricted Carrying, Moving and Handling Objects Goal Status [phone]): At least 1 percent but less than 20 percent impaired, limited or restricted  Monica [name], OTR/L  03/19/2012, 11:40 AM

## 2012-03-24 ENCOUNTER — Ambulatory Visit (HOSPITAL_COMMUNITY): Payer: Medicare Other | Admitting: Specialist

## 2012-03-24 ENCOUNTER — Ambulatory Visit (HOSPITAL_COMMUNITY)
Admission: RE | Admit: 2012-03-24 | Discharge: 2012-03-24 | Disposition: A | Discharge status: Home / Self Care | Payer: Medicare Other | Source: Ambulatory Visit | Attending: Internal Medicine | Admitting: Internal Medicine

## 2012-03-24 DIAGNOSIS — M7500 Adhesive capsulitis of unspecified shoulder: Secondary | ICD-10-CM

## 2012-03-24 DIAGNOSIS — M6281 Muscle weakness (generalized): Secondary | ICD-10-CM

## 2012-03-24 DIAGNOSIS — M25519 Pain in unspecified shoulder: Secondary | ICD-10-CM

## 2012-03-24 NOTE — Progress Notes (Signed)
Occupational Therapy Treatment Patient Details  Name: Monica [name] MRN: [id number] Date of Birth: 1940-07-20  Today's Date: 03/24/2012 Time: 8786-7672 OT Time Calculation (min): 41 min Manual Therapy 316-336 20' Therapeutic Exercise 337-357 20'  Visit#: 7  of 12   Re-eval: 04/16/12   Subjective Symptoms/Limitations Symptoms: S:  My shoulder is just sore my back still really hurts. Pain Assessment Currently in Pain?: No/denies  Precautions/Restrictions     Exercise/Treatments Supine Protraction: PROM;10 reps;AROM;20 reps Horizontal ABduction: PROM;10 reps;AROM;20 reps External Rotation: PROM;10 reps;AROM;20 reps Internal Rotation: PROM;10 reps;AROM;20 reps Flexion: PROM;10 reps;AROM;20 reps ABduction: PROM;10 reps;AROM;20 reps Seated Elevation: AROM;15 reps Extension: AROM;15 reps Retraction: AROM;15 reps Row: AROM;15 reps Protraction: AROM;10 reps Horizontal ABduction: AROM;10 reps External Rotation: AROM;10 reps Internal Rotation: AROM;10 reps Flexion: AROM;10 reps Abduction: AROM;10 reps Therapy Ball Flexion: 25 reps ABduction: 25 reps Right/Left: 5 reps ROM / Strengthening / Isometric Strengthening Wall Wash: 2' Thumb Tacks: 1 "W" Arms: x10 X to V Arms: x10 Prot/Ret//Elev/Dep: 1     Manual Therapy Manual Therapy: Myofascial release Myofascial Release: MFR to left upper arm, scapular, upper and mid trapezius region with manual cervcial traction. PROM to left shoulder in supine through all available ranges   Occupational Therapy Assessment and Plan OT Assessment and Plan Clinical Impression Statement: A:  Resumed all exercises and switched from AAROM in seated to AROM secondary to full AROM in supine with flexion, abd, hzn abd.  ER remains limited. OT Plan: P:  Add 1# to supine exercise.   Goals Short Term Goals Time to Complete Short Term Goals: 3 weeks Short Term Goal 1: Patient will be educated on a HEP. Short Term Goal 2: Patient will increase  PROM in left shoulder to [facility] for increased ability to reach to curl her hair. Short Term Goal 3: Patient will increase left shoulder strength to 4+/5 for increased ability to lift bags of groceries. Short Term Goal 4: Patient will decrease left shoulder fascial restrictions to min-mod for increased abilty to reach overhead. Short Term Goal 5: Patient will decrease pain level to 4/10 when weightbearing on her left arm.  Long Term Goals Long Term Goal 1: Patient will return to prior level of I with all B/IADLs and leisure activities Long Term Goal 2: Patient will increase AROM in her left shoulder to WNL for increased ability to reach into overhead cabinet. Long Term Goal 3: Patient will increase left shoulder strength to 5/5 for increased ability to lift bags of groceries.  Long Term Goal 4: Patient will decrease pain in her left shoulder to 2/10 when fixing her hair.  Long Term Goal 5: Patient will decrease fascial restrictions in her left shoulder to trace.   Problem List Patient Active Problem List  Diagnosis  . HELICOBACTER PYLORI INFECTION  . FIBROIDS, UTERUS  . MIXED HYPERLIPIDEMIA  . ANXIETY  . DEPRESSION, SITUATIONAL  . GLAUCOMA  . ESSENTIAL HYPERTENSION  . ESOPHAGEAL STRICTURE  . GERD  . HIATAL HERNIA  . CONSTIPATION  . IRRITABLE BOWEL SYNDROME  . DYSPAREUNIA  . ARTHRITIS  . LOW BACK PAIN, CHRONIC  . OSTEOPENIA  . DYSPHAGIA UNSPECIFIED  . CERVICAL STRAIN  . LUMBAR STRAIN  . COLONIC POLYPS, ADENOMATOUS, HX OF  . Chronic constipation  . GERD (gastroesophageal reflux disease)  . Leukopenia  . Pain in joint, shoulder region  . Muscle weakness (generalized)  . Frozen shoulder    End of Session Activity Tolerance: Patient tolerated treatment well General Behavior During Session: [facility] for tasks  performed Cognition: [facility] for tasks performed  GO    Noralee Stain, Meredith Kilbride L 03/24/2012, 4:40 PM

## 2012-03-28 ENCOUNTER — Ambulatory Visit (HOSPITAL_COMMUNITY)
Admission: RE | Admit: 2012-03-28 | Discharge: 2012-03-28 | Disposition: A | Discharge status: Home / Self Care | Payer: Medicare Other | Source: Ambulatory Visit | Attending: Orthopedic Surgery | Admitting: Orthopedic Surgery

## 2012-03-28 DIAGNOSIS — M25519 Pain in unspecified shoulder: Secondary | ICD-10-CM

## 2012-03-28 DIAGNOSIS — M6281 Muscle weakness (generalized): Secondary | ICD-10-CM

## 2012-03-28 DIAGNOSIS — M7500 Adhesive capsulitis of unspecified shoulder: Secondary | ICD-10-CM

## 2012-03-28 NOTE — Progress Notes (Signed)
Occupational Therapy Treatment Patient Details  Name: Monica [name] MRN: [id number] Date of Birth: Sep 25, 1940  Today's Date: 03/28/2012 Time: 4098-1191 OT Time Calculation (min): 40 min Manual Therapy 850-902 12' Therapeutic Exercises 902-930 28' Visit#: 8  of 12   Re-eval: 04/16/12    Authorization: Medicare  Authorization Time Period: before 16th visit  Authorization Visit#: 8  of 16   Subjective S:  I feel so much better.  My back doesnt hurt anymore and my shoulder is just a bit sore. Pain Assessment Currently in Pain?: No/denies Pain Score: 0-No pain  Precautions/Restrictions   progress as tolerated  Exercise/Treatments Supine Protraction: PROM;Strengthening;10 reps Protraction Weight (lbs): 1 Horizontal ABduction: PROM;Strengthening;10 reps Horizontal ABduction Weight (lbs): 1 External Rotation: PROM;Strengthening;10 reps External Rotation Weight (lbs): 1 Internal Rotation: PROM;Strengthening;10 reps Internal Rotation Weight (lbs): 1 Flexion: PROM;Strengthening;10 reps Shoulder Flexion Weight (lbs): 1 ABduction: PROM;Strengthening;10 reps Shoulder ABduction Weight (lbs): 1 Seated Elevation: AROM;15 reps Extension: AROM;15 reps Retraction: AROM;15 reps Row: AROM;15 reps Protraction: Strengthening;10 reps Protraction Weight (lbs): 1 Horizontal ABduction: Strengthening;10 reps Horizontal ABduction Weight (lbs): 1 External Rotation: Strengthening;10 reps;Limitations External Rotation Weight (lbs): 1 External Rotation Limitations: able to use weight, but needs cuing for correct positioning Internal Rotation: Strengthening;10 reps Internal Rotation Weight (lbs): 1 Flexion: Strengthening;10 reps Flexion Weight (lbs): 1 Abduction: Strengthening;10 reps ABduction Weight (lbs): 1 Therapy Ball Flexion:  (dc) ABduction:  (dc) Right/Left: 5 reps (dc after todays visit) ROM / Strengthening / Isometric Strengthening UBE (Upper Arm Bike): 2' and 2' at 1.0 Wall  Wash: 2' with 1# Thumb Tacks: dc "W" Arms: 15 X to V Arms: 15 Prot/Ret//Elev/Dep: dc      Manual Therapy Manual Therapy: Myofascial release Myofascial Release: MFR to left upper arm, scapular, upper and mid trapezius region with manual cervcial traction. PROM to left shoulder in supine through all available ranges 850-902  Occupational Therapy Assessment and Plan OT Assessment and Plan Clinical Impression Statement: A:  Added strengthening in supine and seated.  Patient demonstrated good form with all exercises, except ER in seated.   OT Plan: P:  Increase reps with strengthening exercises in seated and supine.  Increase I with ER exercises.   Goals Short Term Goals Time to Complete Short Term Goals: 3 weeks Short Term Goal 1: Patient will be educated on a HEP. Short Term Goal 2: Patient will increase PROM in left shoulder to [facility] for increased ability to reach to curl her hair. Short Term Goal 3: Patient will increase left shoulder strength to 4+/5 for increased ability to lift bags of groceries. Short Term Goal 4: Patient will decrease left shoulder fascial restrictions to min-mod for increased abilty to reach overhead. Short Term Goal 5: Patient will decrease pain level to 4/10 when weightbearing on her left arm.  Long Term Goals Long Term Goal 1: Patient will return to prior level of I with all B/IADLs and leisure activities Long Term Goal 1 Progress: Progressing toward goal Long Term Goal 2: Patient will increase AROM in her left shoulder to WNL for increased ability to reach into overhead cabinet. Long Term Goal 2 Progress: Progressing toward goal Long Term Goal 3: Patient will increase left shoulder strength to 5/5 for increased ability to lift bags of groceries.  Long Term Goal 3 Progress: Progressing toward goal Long Term Goal 4: Patient will decrease pain in her left shoulder to 2/10 when fixing her hair.  Long Term Goal 4 Progress: Progressing toward goal Long Term Goal 5:  Patient will  decrease fascial restrictions in her left shoulder to trace.  Long Term Goal 5 Progress: Progressing toward goal  Problem List Patient Active Problem List  Diagnosis  . HELICOBACTER PYLORI INFECTION  . FIBROIDS, UTERUS  . MIXED HYPERLIPIDEMIA  . ANXIETY  . DEPRESSION, SITUATIONAL  . GLAUCOMA  . ESSENTIAL HYPERTENSION  . ESOPHAGEAL STRICTURE  . GERD  . HIATAL HERNIA  . CONSTIPATION  . IRRITABLE BOWEL SYNDROME  . DYSPAREUNIA  . ARTHRITIS  . LOW BACK PAIN, CHRONIC  . OSTEOPENIA  . DYSPHAGIA UNSPECIFIED  . CERVICAL STRAIN  . LUMBAR STRAIN  . COLONIC POLYPS, ADENOMATOUS, HX OF  . Chronic constipation  . GERD (gastroesophageal reflux disease)  . Leukopenia  . Pain in joint, shoulder region  . Muscle weakness (generalized)  . Frozen shoulder    End of Session Activity Tolerance: Patient tolerated treatment well General Behavior During Session: [facility] for tasks performed Cognition: [facility] for tasks performed  GO    Shirlean Mylar, OTR/L  03/28/2012, 9:30 AM

## 2012-04-01 ENCOUNTER — Ambulatory Visit (HOSPITAL_COMMUNITY)
Admission: RE | Admit: 2012-04-01 | Discharge: 2012-04-01 | Disposition: A | Discharge status: Home / Self Care | Payer: Medicare Other | Source: Ambulatory Visit | Attending: Orthopedic Surgery | Admitting: Orthopedic Surgery

## 2012-04-01 DIAGNOSIS — M25519 Pain in unspecified shoulder: Secondary | ICD-10-CM

## 2012-04-01 DIAGNOSIS — M7500 Adhesive capsulitis of unspecified shoulder: Secondary | ICD-10-CM

## 2012-04-01 DIAGNOSIS — M6281 Muscle weakness (generalized): Secondary | ICD-10-CM

## 2012-04-01 NOTE — Progress Notes (Signed)
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 Pain Assessment Currently in Pain?: No/denies Pain Score: 0-No pain  Precautions/Restrictions  Precautions Precautions: None  Exercise/Treatments Supine Protraction: PROM;Strengthening;12 reps Protraction Weight (lbs): 1 Horizontal ABduction: PROM;Strengthening;12 reps Horizontal ABduction Weight (lbs): 1 External Rotation: PROM;Strengthening;12 reps External Rotation Weight (lbs): 1 Internal Rotation: PROM;Strengthening;12 reps Internal Rotation Weight (lbs): 1 Flexion: PROM;Strengthening;12 reps Shoulder Flexion Weight (lbs): 1 ABduction: PROM;Strengthening;12 reps Shoulder ABduction Weight (lbs): 1 Seated Elevation:  (switched to tband) Protraction: Strengthening;10 reps Protraction Weight (lbs): 1 Horizontal ABduction: Strengthening;10 reps Horizontal ABduction Weight (lbs): 1 External Rotation: Strengthening;10 reps;Limitations External Rotation Weight (lbs): 1 External Rotation Limitations: able to use weight, but needs cuing for correct positioning Internal Rotation: Strengthening;10 reps Internal Rotation Weight (lbs): 1 Flexion: Strengthening;10 reps Flexion Weight (lbs): 1 Abduction: Strengthening;10 reps ABduction Weight (lbs): 1 Standing Protraction: Theraband;10 reps Theraband Level (Shoulder Protraction): Level 2 (Red) Horizontal ABduction: Theraband;10 reps Theraband Level (Shoulder Horizontal  ABduction): Level 2 (Red) External Rotation: Theraband;10 reps ROM / Strengthening / Isometric Strengthening UBE (Upper Arm Bike): 2' and 2' at 3.0-bike would not go lower than 3.0 Wall Wash: 3' with 1# "W" Arms: 15 X to V Arms: 15     Manual Therapy Manual Therapy: Myofascial release Myofascial Release: MFR to left upper arm, scapular, upper and mid trapezius region with manual cervcial traction. PROM to left shoulder in supine through all available ranges   Occupational Therapy Assessment and Plan OT Assessment and Plan Clinical Impression Statement: A:  Added 1# to x to v and w arms also switched scapular AROM to tband for strengthening.  Patient has good form once instructed on technique. OT Plan: P:  Increase reps with weighted ex and tband.   Goals Short Term Goals Time to Complete Short Term Goals: 3 weeks Short Term Goal 1: Patient will be educated on a HEP. Short Term Goal 2: Patient will increase PROM in left shoulder to [facility] for increased ability to reach to curl her hair. Short Term Goal 3: Patient will increase left shoulder strength to 4+/5 for increased ability to lift bags of groceries. Short Term Goal 4: Patient will decrease left shoulder fascial restrictions to min-mod for increased abilty to reach overhead. Short Term Goal 5: Patient will decrease pain level to 4/10 when weightbearing on her left arm.  Long Term Goals Long Term Goal 1: Patient will return to prior level of I with all B/IADLs and leisure activities Long Term Goal 2: Patient will increase AROM in her left shoulder to WNL for increased ability to reach into overhead cabinet. Long Term Goal 3: Patient will increase left shoulder strength to 5/5 for increased ability to lift bags of groceries.  Long Term Goal 4: Patient will decrease pain in her left shoulder to 2/10 when fixing her hair.  Long Term Goal 5: Patient will decrease fascial restrictions in her left shoulder to trace.   Problem List Patient  Active Problem List  Diagnosis  . HELICOBACTER PYLORI INFECTION  . FIBROIDS, UTERUS  .  MIXED HYPERLIPIDEMIA  . ANXIETY  . DEPRESSION, SITUATIONAL  . GLAUCOMA  . ESSENTIAL HYPERTENSION  . ESOPHAGEAL STRICTURE  . GERD  . HIATAL HERNIA  . CONSTIPATION  . IRRITABLE BOWEL SYNDROME  . DYSPAREUNIA  . ARTHRITIS  . LOW BACK PAIN, CHRONIC  . OSTEOPENIA  . DYSPHAGIA UNSPECIFIED  . CERVICAL STRAIN  . LUMBAR STRAIN  . COLONIC POLYPS, ADENOMATOUS, HX OF  . Chronic constipation  . GERD (gastroesophageal reflux disease)  . Leukopenia  . Pain in joint, shoulder region  . Muscle weakness (generalized)  . Frozen shoulder    End of Session Activity Tolerance: Patient tolerated treatment well General Behavior During Session: [facility] for tasks performed Cognition: [facility] for tasks performed  GO    Noralee Stain, Flavio Lindroth L 04/01/2012, 10:19 AM

## 2012-04-03 ENCOUNTER — Ambulatory Visit (HOSPITAL_COMMUNITY)
Admission: RE | Admit: 2012-04-03 | Discharge: 2012-04-03 | Disposition: A | Discharge status: Home / Self Care | Payer: Medicare Other | Source: Ambulatory Visit | Attending: Orthopedic Surgery | Admitting: Orthopedic Surgery

## 2012-04-03 DIAGNOSIS — M6281 Muscle weakness (generalized): Secondary | ICD-10-CM

## 2012-04-03 DIAGNOSIS — M25519 Pain in unspecified shoulder: Secondary | ICD-10-CM

## 2012-04-03 DIAGNOSIS — M7500 Adhesive capsulitis of unspecified shoulder: Secondary | ICD-10-CM

## 2012-04-03 NOTE — Progress Notes (Signed)
Occupational Therapy Treatment Patient Details  Name: Monica [name] MRN: [id number] Date of Birth: [birth year]  Today's Date: 04/03/2012 Time: 7829-5621 OT Time Calculation (min): 44 min Manual Therapy 157-213 16' Therapeutic Exercise [phone] 27'  Visit#: 10  of 12   Re-eval: 04/16/12 Assessment Diagnosis: S/P Left Shoulder Arthoscopy secondary to Frozen Shoulder  Authorization: Medicare  Authorization Time Period: before 16th visit   Authorization Visit#: 10  of 16   Subjective Symptoms/Limitations Symptoms: S:  If I put any weight on it or turn the steering wheel it hurts.  Precautions/Restrictions  Precautions Precautions: None  Exercise/Treatments Supine Protraction: PROM;Strengthening;12 reps Protraction Weight (lbs): 1 Horizontal ABduction: PROM;Strengthening;12 reps Horizontal ABduction Weight (lbs): 1 External Rotation: PROM;Strengthening;12 reps External Rotation Weight (lbs): 1 Internal Rotation: PROM;Strengthening;12 reps Internal Rotation Weight (lbs): 1 Flexion: PROM;Strengthening;12 reps Shoulder Flexion Weight (lbs): 1 ABduction: PROM;Strengthening;12 reps Shoulder ABduction Weight (lbs): 1 Seated Protraction: Strengthening;12 reps Protraction Weight (lbs): 1 Horizontal ABduction: Strengthening;12 reps Horizontal ABduction Weight (lbs): 1 External Rotation: Strengthening;Limitations;12 reps External Rotation Weight (lbs): 1 Internal Rotation: Strengthening;12 reps Internal Rotation Weight (lbs): 1 Flexion: Strengthening;12 reps Flexion Weight (lbs): 1 Abduction: Strengthening;12 reps ABduction Weight (lbs): 1 Standing Extension: Theraband;12 reps Theraband Level (Shoulder Extension): Level 2 (Red) Row: Theraband;12 reps Theraband Level (Shoulder Row): Level 2 (Red) Retraction: Theraband;12 reps Theraband Level (Shoulder Retraction): Level 2 (Red) ROM / Strengthening / Isometric Strengthening UBE (Upper Arm Bike): 2' forward 2' backwards  3.0 Wall Wash: 3' with 1# "W" Arms: 15 X to V Arms: 15 Proximal Shoulder Strengthening, Seated: 15       Manual Therapy Manual Therapy: Myofascial release Myofascial Release: MFR to left upper arm, scapular, upper and mid trapezius region with manual cervcial traction. PROM to left shoulder in supine through all available ranges   Occupational Therapy Assessment and Plan OT Assessment and Plan Clinical Impression Statement: A: Added proximal shoulder strengthening seated, did not increase wall wash time or weight secondary to fatigue. OT Plan: P:  Increase proximal shoulder strengthening reps and increase supine weight to 2#.   Goals Short Term Goals Time to Complete Short Term Goals: 3 weeks Short Term Goal 1: Patient will be educated on a HEP. Short Term Goal 2: Patient will increase PROM in left shoulder to Monica [name] for increased ability to reach to curl her hair. Short Term Goal 3: Patient will increase left shoulder strength to 4+/5 for increased ability to lift bags of groceries. Short Term Goal 4: Patient will decrease left shoulder fascial restrictions to min-mod for increased abilty to reach overhead. Short Term Goal 5: Patient will decrease pain level to 4/10 when weightbearing on her left arm.  Long Term Goals Long Term Goal 1: Patient will return to prior level of I with all B/IADLs and leisure activities Long Term Goal 2: Patient will increase AROM in her left shoulder to WNL for increased ability to reach into overhead cabinet. Long Term Goal 3: Patient will increase left shoulder strength to 5/5 for increased ability to lift bags of groceries.  Long Term Goal 4: Patient will decrease pain in her left shoulder to 2/10 when fixing her hair.  Long Term Goal 5: Patient will decrease fascial restrictions in her left shoulder to trace.   Problem List Patient Active Problem List  Diagnosis  . HELICOBACTER PYLORI INFECTION  . FIBROIDS, UTERUS  . MIXED HYPERLIPIDEMIA  .  ANXIETY  . DEPRESSION, SITUATIONAL  . GLAUCOMA  . ESSENTIAL HYPERTENSION  . ESOPHAGEAL STRICTURE  . GERD  .  HIATAL HERNIA  . CONSTIPATION  . IRRITABLE BOWEL SYNDROME  . DYSPAREUNIA  . ARTHRITIS  . LOW BACK PAIN, CHRONIC  . OSTEOPENIA  . DYSPHAGIA UNSPECIFIED  . CERVICAL STRAIN  . LUMBAR STRAIN  . COLONIC POLYPS, ADENOMATOUS, HX OF  . Chronic constipation  . GERD (gastroesophageal reflux disease)  . Leukopenia  . Pain in joint, shoulder region  . Muscle weakness (generalized)  . Frozen shoulder    End of Session Activity Tolerance: Patient tolerated treatment well General Behavior During Session: Monica [name] for tasks performed Cognition: Monica [name] for tasks performed  GO    Monica [name], Monica [name] 04/03/2012, 2:47 PM

## 2012-04-08 ENCOUNTER — Ambulatory Visit (HOSPITAL_COMMUNITY)
Admission: RE | Admit: 2012-04-08 | Discharge: 2012-04-08 | Disposition: A | Discharge status: Home / Self Care | Payer: Medicare Other | Source: Ambulatory Visit | Attending: Orthopedic Surgery | Admitting: Orthopedic Surgery

## 2012-04-08 NOTE — Progress Notes (Signed)
Occupational Therapy Treatment Patient Details  Name: Monica [name] MRN: [id number] Date of Birth: 16-Dec-1940  Today's Date: 04/08/2012 Time: 1020-1120 OT Time Calculation (min): 60 min Manual Therapy 1020-1033 13' Therapeutic Exercises [phone] 72' Reassessment 15' Visit#: 11  of 18   Re-eval: 05/06/12    Authorization: Medicare  Authorization Time Period: before 21st visit   Authorization Visit#: 11  of 21   Subjective  S:  I still cant lift my frying pan or turn the steering wheel very well, but I can reach out to the side easier. Special Tests: UEFI 53% Pain Assessment Currently in Pain?: Yes Pain Score:   3 Pain Location: Shoulder Pain Orientation: Left Pain Type: Acute pain  Precautions/Restrictions   Progress as tolerated  Exercise/Treatments Supine Protraction: PROM;Strengthening;10 reps Protraction Weight (lbs): 2 Horizontal ABduction: PROM;Strengthening;10 reps Horizontal ABduction Weight (lbs): 2 External Rotation: PROM;Strengthening;10 reps External Rotation Weight (lbs): 2 Internal Rotation: PROM;Strengthening;10 reps Internal Rotation Weight (lbs): 2 Flexion: PROM;Strengthening;10 reps Shoulder Flexion Weight (lbs): 2 ABduction: PROM;Strengthening;10 reps Shoulder ABduction Weight (lbs): 2 Seated Protraction: Strengthening;10 reps Protraction Weight (lbs): 2 Horizontal ABduction: Strengthening;10 reps Horizontal ABduction Weight (lbs): 2 External Rotation: Strengthening;10 reps External Rotation Weight (lbs): 2 Internal Rotation: Strengthening;10 reps Internal Rotation Weight (lbs): 2 Flexion: Strengthening;10 reps Flexion Weight (lbs): 2 Abduction: Strengthening;10 reps ABduction Weight (lbs): 2 Standing External Rotation: Theraband;15 reps Theraband Level (Shoulder External Rotation): Level 3 (Green) Internal Rotation: Theraband;15 reps Theraband Level (Shoulder Internal Rotation): Level 3 (Green) Extension: Theraband;15 reps Theraband  Level (Shoulder Extension): Level 3 (Green) Row: Theraband;15 reps Theraband Level (Shoulder Row): Level 3 (Green) Retraction: Theraband;15 reps Theraband Level (Shoulder Retraction): Level 3 (Green) ROM / Strengthening / Isometric Strengthening UBE (Upper Arm Bike): 3' and 3' 1.0 Wall Wash: 4' with 1# "W" Arms: resume next visit and add 1# X to V Arms: resume next visit and add 1# Proximal Shoulder Strengthening, Seated: resume next visit and add 1# Other ROM/Strengthening Exercises: begin IR activity with reaching for ball behind back  Other ROM/Strengthening Exercises: simulate steering wheel activity - begin next visit         Occupational Therapy Assessment and Plan OT Assessment and Plan Clinical Impression Statement: A:  lexion 140 4+/5 (138 4+/5), abduction 147 4+/5 (131 4+/5), external rotation with shoulder abducted 68 4+/5 (45 4+/5), internal rotation with shoulder abducted 40 4+/5 (with shoulder adducted 85 4+/5).  Increased to 2# with strengthening in supine and seated.    OT Frequency: Min 2X/week OT Duration: 4 weeks OT Plan: P: Increase reps with seated and supine strengthening, add weight to x to v and w arm exercises. Add IR and steering wheel functional activities.   Goals Short Term Goals Time to Complete Short Term Goals: 3 weeks Short Term Goal 1: Patient will be educated on a HEP. Short Term Goal 1 Progress: Met Short Term Goal 2: Patient will increase PROM in left shoulder to [facility] for increased ability to reach to curl her hair. Short Term Goal 2 Progress: Met Short Term Goal 3: Patient will increase left shoulder strength to 4+/5 for increased ability to lift bags of groceries. Short Term Goal 3 Progress: Met Short Term Goal 4: Patient will decrease left shoulder fascial restrictions to min-mod for increased abilty to reach overhead. Short Term Goal 4 Progress: Met Short Term Goal 5: Patient will decrease pain level to 4/10 when weightbearing on her left  arm.  Short Term Goal 5 Progress: Met Long Term Goals Long Term Goal 1:  Patient will return to prior level of I with all B/IADLs and leisure activities Long Term Goal 1 Progress: Progressing toward goal Long Term Goal 2: Patient will increase AROM in her left shoulder to WNL for increased ability to reach into overhead cabinet. Long Term Goal 2 Progress: Progressing toward goal Long Term Goal 3: Patient will increase left shoulder strength to 5/5 for increased ability to lift bags of groceries.  Long Term Goal 3 Progress: Progressing toward goal Long Term Goal 4: Patient will decrease pain in her left shoulder to 2/10 when fixing her hair.  Long Term Goal 4 Progress: Progressing toward goal Long Term Goal 5: Patient will decrease fascial restrictions in her left shoulder to trace.  Long Term Goal 5 Progress: Progressing toward goal Long Term Goal 6: Patient will increase scapular stability to normal in her left shoulder  Problem List Patient Active Problem List  Diagnosis  . HELICOBACTER PYLORI INFECTION  . FIBROIDS, UTERUS  . MIXED HYPERLIPIDEMIA  . ANXIETY  . DEPRESSION, SITUATIONAL  . GLAUCOMA  . ESSENTIAL HYPERTENSION  . ESOPHAGEAL STRICTURE  . GERD  . HIATAL HERNIA  . CONSTIPATION  . IRRITABLE BOWEL SYNDROME  . DYSPAREUNIA  . ARTHRITIS  . LOW BACK PAIN, CHRONIC  . OSTEOPENIA  . DYSPHAGIA UNSPECIFIED  . CERVICAL STRAIN  . LUMBAR STRAIN  . COLONIC POLYPS, ADENOMATOUS, HX OF  . Chronic constipation  . GERD (gastroesophageal reflux disease)  . Leukopenia  . Pain in joint, shoulder region  . Muscle weakness (generalized)  . Frozen shoulder    End of Session Activity Tolerance: Patient tolerated treatment well General Behavior During Session: [facility] for tasks performed Cognition: [facility] for tasks performed OT Plan of Care OT Home Exercise Plan: Added seated strengthening exercises this date.  GO Functional Assessment Tool Used: UEFI scored 53% I level (47% Impairment  level)  decreased from 67% at previous reassessment Functional Limitation: Carrying, moving and handling objects Carrying, Moving and Handling Objects Current Status (U9811): At least 40 percent but less than 60 percent impaired, limited or restricted Carrying, Moving and Handling Objects Goal Status [phone]): At least 1 percent but less than 20 percent impaired, limited or restricted  Shirlean Mylar, OTR/L  04/08/2012, 11:28 AM

## 2012-04-09 ENCOUNTER — Ambulatory Visit (HOSPITAL_COMMUNITY)
Admission: RE | Admit: 2012-04-09 | Discharge: 2012-04-09 | Disposition: A | Discharge status: Home / Self Care | Payer: Medicare Other | Source: Ambulatory Visit | Attending: Internal Medicine | Admitting: Internal Medicine

## 2012-04-09 DIAGNOSIS — M6281 Muscle weakness (generalized): Secondary | ICD-10-CM

## 2012-04-09 DIAGNOSIS — M7500 Adhesive capsulitis of unspecified shoulder: Secondary | ICD-10-CM

## 2012-04-09 DIAGNOSIS — M25519 Pain in unspecified shoulder: Secondary | ICD-10-CM

## 2012-04-09 NOTE — Progress Notes (Signed)
Occupational Therapy Treatment Patient Details  Name: Monica [name] MRN: [id number] Date of Birth: 1940/06/13  Today's Date: 04/09/2012 Time: 4098-1191 OT Time Calculation (min): 53 min Manual Therapy 4782-9562 17' Therapeutic Exercise 1308-6578 35'  Visit#: 12  of 18   Re-eval: 05/06/12 Assessment Diagnosis: S/P Left Shoulder Arthoscopy secondary to Frozen Shoulder  Authorization: Medicare  Authorization Time Period: before 21st visit   Authorization Visit#: 12  of 21   Subjective Symptoms/Limitations Symptoms: S:  This thing is sore as I don't know what.  No pain just sore. Pain Assessment Currently in Pain?: No/denies Pain Score: 0-No pain  Precautions/Restrictions  Precautions Precautions: None  Exercise/Treatments Supine Protraction: PROM;Strengthening;12 reps Protraction Weight (lbs): 2 Horizontal ABduction: PROM;Strengthening;12 reps Horizontal ABduction Weight (lbs): 2 External Rotation: PROM;Strengthening;12 reps External Rotation Weight (lbs): 2 Internal Rotation: PROM;Strengthening;12 reps Internal Rotation Weight (lbs): 2 Flexion: PROM;Strengthening;12 reps Shoulder Flexion Weight (lbs): 2 ABduction: PROM;Strengthening;12 reps Shoulder ABduction Weight (lbs): 2 Seated Protraction: Strengthening;12 reps Protraction Weight (lbs): 2 Horizontal ABduction: Strengthening;12 reps Horizontal ABduction Weight (lbs): 2 External Rotation: Strengthening;12 reps External Rotation Weight (lbs): 2 Internal Rotation: Strengthening;12 reps Internal Rotation Weight (lbs): 2 Flexion: Strengthening;12 reps Flexion Weight (lbs): 2 Abduction: Strengthening;12 reps ABduction Weight (lbs): 2 Standing External Rotation: Theraband;15 reps Theraband Level (Shoulder External Rotation): Level 3 (Green) Internal Rotation: Theraband;15 reps Theraband Level (Shoulder Internal Rotation): Level 3 (Green) Extension: Theraband;15 reps Theraband Level (Shoulder Extension):  Level 3 (Green) Row: Theraband;15 reps Theraband Level (Shoulder Row): Level 3 (Green) Retraction: Theraband;15 reps Theraband Level (Shoulder Retraction): Level 3 (Green) ROM / Strengthening / Isometric Strengthening UBE (Upper Arm Bike): 3' and 3' 1.5 Wall Wash: 4' with 1# "W" Arms: 10 with 1# X to V Arms: 10 with 1# Proximal Shoulder Strengthening, Seated: 15  Other ROM/Strengthening Exercises: x 5 each way with green ball attempting to reach higher with each rep Other ROM/Strengthening Exercises: wtih red band simulating hand over hand         Manual Therapy Manual Therapy: Myofascial release Myofascial Release: MFR to left upper arm, scapular, upper and mid trapezius region with manual cervcial traction. PROM to left shoulder in supine through all available ranges   Occupational Therapy Assessment and Plan OT Assessment and Plan Clinical Impression Statement: A:  Resumed some missed exercises, added ball circles with green weighted ball and simulated steering wheel secondary to patient had increased pain when driving and going hand over hand to turn wheel. OT Plan: P:  Attempt to increase supine to 3# and increase reps with proximal shoulder strengthening.   Goals Short Term Goals Time to Complete Short Term Goals: 3 weeks Short Term Goal 1: Patient will be educated on a HEP. Short Term Goal 2: Patient will increase PROM in left shoulder to [facility] for increased ability to reach to curl her hair. Short Term Goal 3: Patient will increase left shoulder strength to 4+/5 for increased ability to lift bags of groceries. Short Term Goal 4: Patient will decrease left shoulder fascial restrictions to min-mod for increased abilty to reach overhead. Short Term Goal 5: Patient will decrease pain level to 4/10 when weightbearing on her left arm.  Long Term Goals Long Term Goal 1: Patient will return to prior level of I with all B/IADLs and leisure activities Long Term Goal 2: Patient will  increase AROM in her left shoulder to WNL for increased ability to reach into overhead cabinet. Long Term Goal 3: Patient will increase left shoulder strength to 5/5 for increased  ability to lift bags of groceries.  Long Term Goal 4: Patient will decrease pain in her left shoulder to 2/10 when fixing her hair.  Long Term Goal 5: Patient will decrease fascial restrictions in her left shoulder to trace.  Long Term Goal 6: Patient will increase scapular stability to normal in her left shoulder  Problem List Patient Active Problem List  Diagnosis  . HELICOBACTER PYLORI INFECTION  . FIBROIDS, UTERUS  . MIXED HYPERLIPIDEMIA  . ANXIETY  . DEPRESSION, SITUATIONAL  . GLAUCOMA  . ESSENTIAL HYPERTENSION  . ESOPHAGEAL STRICTURE  . GERD  . HIATAL HERNIA  . CONSTIPATION  . IRRITABLE BOWEL SYNDROME  . DYSPAREUNIA  . ARTHRITIS  . LOW BACK PAIN, CHRONIC  . OSTEOPENIA  . DYSPHAGIA UNSPECIFIED  . CERVICAL STRAIN  . LUMBAR STRAIN  . COLONIC POLYPS, ADENOMATOUS, HX OF  . Chronic constipation  . GERD (gastroesophageal reflux disease)  . Leukopenia  . Pain in joint, shoulder region  . Muscle weakness (generalized)  . Frozen shoulder    End of Session Activity Tolerance: Patient tolerated treatment well General Behavior During Session: [facility] for tasks performed Cognition: [facility] for tasks performed  GO    Monica [name], Monica [name] 04/09/2012, 11:07 AM

## 2012-04-14 ENCOUNTER — Ambulatory Visit (HOSPITAL_COMMUNITY)
Admission: RE | Admit: 2012-04-14 | Discharge: 2012-04-14 | Disposition: A | Discharge status: Home / Self Care | Payer: Medicare Other | Source: Ambulatory Visit | Attending: Internal Medicine | Admitting: Internal Medicine

## 2012-04-14 DIAGNOSIS — IMO0001 Reserved for inherently not codable concepts without codable children: Secondary | ICD-10-CM | POA: Insufficient documentation

## 2012-04-14 DIAGNOSIS — M25519 Pain in unspecified shoulder: Secondary | ICD-10-CM | POA: Insufficient documentation

## 2012-04-14 DIAGNOSIS — M25619 Stiffness of unspecified shoulder, not elsewhere classified: Secondary | ICD-10-CM | POA: Insufficient documentation

## 2012-04-14 DIAGNOSIS — M6281 Muscle weakness (generalized): Secondary | ICD-10-CM | POA: Insufficient documentation

## 2012-04-14 DIAGNOSIS — M7500 Adhesive capsulitis of unspecified shoulder: Secondary | ICD-10-CM

## 2012-04-14 NOTE — Progress Notes (Signed)
Occupational Therapy Treatment Patient Details  Name: Monica [name] MRN: [id number] Date of Birth: 1941-01-28  Today's Date: 04/14/2012 Time: 4098-1191 OT Time Calculation (min): 43 min Manual Therapy 852-905 13' Therapeutic Exercise [id number] 29'  Visit#: 13  of 18   Re-eval: 04/22/12 Assessment Diagnosis: S/P Left Shoulder Arthoscopy secondary to Frozen Shoulder  Authorization: Medicare  Authorization Time Period: before 21st visit   Authorization Visit#: 13  of 21   Subjective Symptoms/Limitations Symptoms: S:  It doesn't hurt just feels like something is going on in there. Pain Assessment Currently in Pain?: Yes Pain Score: 0-No pain  Precautions/Restrictions  Precautions Precautions: None  Exercise/Treatments Supine Protraction: PROM;Strengthening;10 reps Protraction Weight (lbs): 3 Horizontal ABduction: PROM;Strengthening;10 reps Horizontal ABduction Weight (lbs): 3 External Rotation: PROM;Strengthening;10 reps External Rotation Weight (lbs): 3 Internal Rotation: PROM;Strengthening;10 reps Internal Rotation Weight (lbs): 3 Flexion: PROM;Strengthening;10 reps Shoulder Flexion Weight (lbs): 3 ABduction: PROM;Strengthening;10 reps Shoulder ABduction Weight (lbs): 3 Seated Protraction: Strengthening;15 reps Protraction Weight (lbs): 2 Horizontal ABduction: Strengthening;15 reps Horizontal ABduction Weight (lbs): 2 External Rotation: Strengthening;15 reps External Rotation Weight (lbs): 2 Internal Rotation: Strengthening;15 reps Internal Rotation Weight (lbs): 2 Flexion: Strengthening;15 reps Flexion Weight (lbs): 2 Abduction: Strengthening;15 reps ABduction Weight (lbs): 2 Standing External Rotation: Theraband;15 reps Theraband Level (Shoulder External Rotation): Level 3 (Green) Internal Rotation: Theraband;15 reps Theraband Level (Shoulder Internal Rotation): Level 3 (Green) Extension: Theraband;15 reps Theraband Level (Shoulder Extension): Level 3  (Green) Row: Theraband;15 reps Theraband Level (Shoulder Row): Level 3 (Green) Retraction: Theraband;15 reps Theraband Level (Shoulder Retraction): Level 3 (Green) ROM / Strengthening / Isometric Strengthening UBE (Upper Arm Bike): 3' and 3' 2.0 Wall Wash: 2' with 2# "W" Arms: 10 with 1# X to V Arms: 10 with 1# Proximal Shoulder Strengthening, Seated: 15  Other ROM/Strengthening Exercises: resume next session Other ROM/Strengthening Exercises: resume next session        Manual Therapy Manual Therapy: Myofascial release Myofascial Release: MFR to left upper arm, scapular, upper and mid trapezius region with manual cervcial traction. PROM to left shoulder in supine through all available ranges   Occupational Therapy Assessment and Plan OT Assessment and Plan Clinical Impression Statement: A:  Added tband to HEP to increase scapular strength.  Some exercises missed secondary to time. Increased supine to 3# which patient tolerated well. OT Plan: P:  Resume missed exercises.  D/C tband to HEP.   Goals Short Term Goals Time to Complete Short Term Goals: 3 weeks Short Term Goal 1: Patient will be educated on a HEP. Short Term Goal 2: Patient will increase PROM in left shoulder to [facility] for increased ability to reach to curl her hair. Short Term Goal 3: Patient will increase left shoulder strength to 4+/5 for increased ability to lift bags of groceries. Short Term Goal 4: Patient will decrease left shoulder fascial restrictions to min-mod for increased abilty to reach overhead. Short Term Goal 5: Patient will decrease pain level to 4/10 when weightbearing on her left arm.  Long Term Goals Long Term Goal 1: Patient will return to prior level of I with all B/IADLs and leisure activities Long Term Goal 2: Patient will increase AROM in her left shoulder to WNL for increased ability to reach into overhead cabinet. Long Term Goal 3: Patient will increase left shoulder strength to 5/5 for  increased ability to lift bags of groceries.  Long Term Goal 4: Patient will decrease pain in her left shoulder to 2/10 when fixing her hair.  Long Term Goal 5: Patient  will decrease fascial restrictions in her left shoulder to trace.  Long Term Goal 6: Patient will increase scapular stability to normal in her left shoulder  Problem List Patient Active Problem List  Diagnosis  . HELICOBACTER PYLORI INFECTION  . FIBROIDS, UTERUS  . MIXED HYPERLIPIDEMIA  . ANXIETY  . DEPRESSION, SITUATIONAL  . GLAUCOMA  . ESSENTIAL HYPERTENSION  . ESOPHAGEAL STRICTURE  . GERD  . HIATAL HERNIA  . CONSTIPATION  . IRRITABLE BOWEL SYNDROME  . DYSPAREUNIA  . ARTHRITIS  . LOW BACK PAIN, CHRONIC  . OSTEOPENIA  . DYSPHAGIA UNSPECIFIED  . CERVICAL STRAIN  . LUMBAR STRAIN  . COLONIC POLYPS, ADENOMATOUS, HX OF  . Chronic constipation  . GERD (gastroesophageal reflux disease)  . Leukopenia  . Pain in joint, shoulder region  . Muscle weakness (generalized)  . Frozen shoulder    End of Session Activity Tolerance: Patient tolerated treatment well General Behavior During Session: [facility] for tasks performed Cognition: [facility] for tasks performed  GO    Noralee Stain, Pheobe Sandiford L 04/14/2012, 10:30 AM

## 2012-04-17 ENCOUNTER — Ambulatory Visit (HOSPITAL_COMMUNITY)
Admission: RE | Admit: 2012-04-17 | Discharge: 2012-04-17 | Disposition: A | Discharge status: Home / Self Care | Payer: Medicare Other | Source: Ambulatory Visit | Attending: Internal Medicine | Admitting: Internal Medicine

## 2012-04-17 DIAGNOSIS — M25519 Pain in unspecified shoulder: Secondary | ICD-10-CM

## 2012-04-17 DIAGNOSIS — M7500 Adhesive capsulitis of unspecified shoulder: Secondary | ICD-10-CM

## 2012-04-17 DIAGNOSIS — M6281 Muscle weakness (generalized): Secondary | ICD-10-CM

## 2012-04-17 NOTE — Progress Notes (Addendum)
Occupational Therapy Treatment Patient Details  Name: Monica [name] MRN: [id number] Date of Birth: 1940/09/28  Today's Date: 04/17/2012 Time: 4098-1191 OT Time Calculation (min): 44 min Manual Therapy 850-904 14' Therapeutic Exercise 905-934 29'  Visit#: 14  of 18   Re-eval: 04/22/12    Authorization: Medicare  Authorization Time Period: before 21st visit   Authorization Visit#: 14  of 21   Subjective Symptoms/Limitations Symptoms: S:  I can tell those band exercises at home are helping, it doesn't feel as tight. Pain Assessment Currently in Pain?: No/denies Pain Score: 0-No pain  Precautions/Restrictions     Exercise/Treatments    Shoulder Exercises: Supine  Protraction PROM;Strengthening;12 reps  Protraction Weight (lbs) 3  Horizontal ABduction PROM;Strengthening;12 reps  Horizontal ABduction Weight (lbs) 3  External Rotation PROM;Strengthening;12 reps  External Rotation Weight (lbs) 3  Internal Rotation PROM;Strengthening;12 reps  Internal Rotation Weight (lbs) 3  Flexion PROM;Strengthening;12 reps  Shoulder Flexion Weight (lbs) 3  ABduction PROM;Strengthening;12 reps  Shoulder ABduction Weight (lbs) 3  Seated Protraction: Strengthening;15 reps Protraction Weight (lbs): 2 Horizontal ABduction: Strengthening;15 reps Horizontal ABduction Weight (lbs): 2 External Rotation: Strengthening;15 reps External Rotation Weight (lbs): 2 Internal Rotation: Strengthening;15 reps Internal Rotation Weight (lbs): 2 Flexion: Strengthening;15 reps Flexion Weight (lbs): 2 Abduction: Strengthening;15 reps ABduction Weight (lbs): 2 Standing External Rotation: Other (comment) (d/c to HEP) ROM / Strengthening / Isometric Strengthening UBE (Upper Arm Bike): 3' and 3' 2.0 Wall Wash: 3' with 2# "W" Arms: 10 with 2# X to V Arms: 10 with 2# Proximal Shoulder Strengthening, Seated: 15  Other ROM/Strengthening Exercises: x 5 each way with green ball attempting to reach higher with  each rep Other ROM/Strengthening Exercises: wtih red band simulating hand over hand       Manual Therapy Manual Therapy: Myofascial release Myofascial Release: MFR to left upper arm, scapular, upper and mid trapezius region with manual cervcial traction. PROM to left shoulder in supine through all available ranges   Occupational Therapy Assessment and Plan OT Assessment and Plan Clinical Impression Statement: A:  Increased supine to 3# weight and d/c'd Theraband to HEP Rehab Potential: Good OT Plan: P:  Increase reps for supine exercises and attempt more weight to seated ex.   Goals Short Term Goals Time to Complete Short Term Goals: 3 weeks Short Term Goal 1: Patient will be educated on a HEP. Short Term Goal 2: Patient will increase PROM in left shoulder to [facility] for increased ability to reach to curl her hair. Short Term Goal 3: Patient will increase left shoulder strength to 4+/5 for increased ability to lift bags of groceries. Short Term Goal 4: Patient will decrease left shoulder fascial restrictions to min-mod for increased abilty to reach overhead. Short Term Goal 5: Patient will decrease pain level to 4/10 when weightbearing on her left arm.  Long Term Goals Long Term Goal 1: Patient will return to prior level of I with all B/IADLs and leisure activities Long Term Goal 2: Patient will increase AROM in her left shoulder to WNL for increased ability to reach into overhead cabinet. Long Term Goal 3: Patient will increase left shoulder strength to 5/5 for increased ability to lift bags of groceries.  Long Term Goal 4: Patient will decrease pain in her left shoulder to 2/10 when fixing her hair.  Long Term Goal 5: Patient will decrease fascial restrictions in her left shoulder to trace.  Long Term Goal 6: Patient will increase scapular stability to normal in her left shoulder  Problem  List Patient Active Problem List  Diagnosis  . HELICOBACTER PYLORI INFECTION  . FIBROIDS, UTERUS   . MIXED HYPERLIPIDEMIA  . ANXIETY  . DEPRESSION, SITUATIONAL  . GLAUCOMA  . ESSENTIAL HYPERTENSION  . ESOPHAGEAL STRICTURE  . GERD  . HIATAL HERNIA  . CONSTIPATION  . IRRITABLE BOWEL SYNDROME  . DYSPAREUNIA  . ARTHRITIS  . LOW BACK PAIN, CHRONIC  . OSTEOPENIA  . DYSPHAGIA UNSPECIFIED  . CERVICAL STRAIN  . LUMBAR STRAIN  . COLONIC POLYPS, ADENOMATOUS, HX OF  . Chronic constipation  . GERD (gastroesophageal reflux disease)  . Leukopenia  . Pain in joint, shoulder region  . Muscle weakness (generalized)  . Frozen shoulder    End of Session Activity Tolerance: Patient tolerated treatment well General Behavior During Session: [facility] for tasks performed Cognition: [facility] for tasks performed  GO    Noralee Stain, Shealynn Saulnier L 04/17/2012, 11:43 AM

## 2012-04-21 ENCOUNTER — Ambulatory Visit (HOSPITAL_COMMUNITY)
Admission: RE | Admit: 2012-04-21 | Discharge: 2012-04-21 | Disposition: A | Discharge status: Home / Self Care | Payer: Medicare Other | Source: Ambulatory Visit | Attending: Internal Medicine | Admitting: Internal Medicine

## 2012-04-21 DIAGNOSIS — M6281 Muscle weakness (generalized): Secondary | ICD-10-CM

## 2012-04-21 DIAGNOSIS — M7500 Adhesive capsulitis of unspecified shoulder: Secondary | ICD-10-CM

## 2012-04-21 DIAGNOSIS — M25519 Pain in unspecified shoulder: Secondary | ICD-10-CM

## 2012-04-21 NOTE — Progress Notes (Signed)
Occupational Therapy Treatment Patient Details  Name: Monica [name] MRN: [id number] Date of Birth: September 06, 1940  Today's Date: 04/21/2012 Time: 4098-1191 OT Time Calculation (min): 43 min Manual Therapy 478-295 20' Therapeutic Exercise 910-921 11' Reassessment 922-932 10'  Visit#: 15  of 18   Re-eval: 04/22/12 Assessment Diagnosis: S/P Left Shoulder Arthoscopy secondary to Frozen Shoulder  Authorization: Medicare  Authorization Time Period: before 21st visit   Authorization Visit#: 15  of 21   Subjective Symptoms/Limitations Symptoms: S:  That bone just bothers me some. Special Tests: UEFI was 53%, currently is 73%  Precautions/Restrictions  Precautions Precautions: None  Exercise/Treatments Supine Protraction: PROM;Strengthening;12 reps Protraction Weight (lbs): 3 Horizontal ABduction: PROM;Strengthening;12 reps Horizontal ABduction Weight (lbs): 3 External Rotation: PROM;Strengthening;12 reps External Rotation Weight (lbs): 3 Internal Rotation: PROM;Strengthening;12 reps Internal Rotation Weight (lbs): 3 Flexion: PROM;Strengthening;12 reps Shoulder Flexion Weight (lbs): 3 ABduction: PROM;Strengthening;12 reps Shoulder ABduction Weight (lbs): 3         Manual Therapy Manual Therapy: Myofascial release Myofascial Release: MFR to left upper arm, scapular, upper and mid trapezius region with manual cervcial traction. PROM to left shoulder in supine through all available ranges   Occupational Therapy Assessment and Plan OT Assessment and Plan Clinical Impression Statement: A:  See d/c summary.  Patient given shoulder stretches to complete at home and instructed to continue with her tband exercises to continue to progress. OT Plan: P:  D/C to HEP as patient has met all goals and has increased her function to 73%.   Goals Short Term Goals Time to Complete Short Term Goals: 3 weeks Short Term Goal 1: Patient will be educated on a HEP. Short Term Goal 2: Patient  will increase PROM in left shoulder to [facility] for increased ability to reach to curl her hair. Short Term Goal 3: Patient will increase left shoulder strength to 4+/5 for increased ability to lift bags of groceries. Short Term Goal 4: Patient will decrease left shoulder fascial restrictions to min-mod for increased abilty to reach overhead. Short Term Goal 5: Patient will decrease pain level to 4/10 when weightbearing on her left arm.  Long Term Goals Long Term Goal 1: Patient will return to prior level of I with all B/IADLs and leisure activities Long Term Goal 2: Patient will increase AROM in her left shoulder to WNL for increased ability to reach into overhead cabinet. Long Term Goal 3: Patient will increase left shoulder strength to 5/5 for increased ability to lift bags of groceries.  Long Term Goal 4: Patient will decrease pain in her left shoulder to 2/10 when fixing her hair.  Long Term Goal 5: Patient will decrease fascial restrictions in her left shoulder to trace.  Long Term Goal 6: Patient will increase scapular stability to normal in her left shoulder  Problem List Patient Active Problem List  Diagnosis  . HELICOBACTER PYLORI INFECTION  . FIBROIDS, UTERUS  . MIXED HYPERLIPIDEMIA  . ANXIETY  . DEPRESSION, SITUATIONAL  . GLAUCOMA  . ESSENTIAL HYPERTENSION  . ESOPHAGEAL STRICTURE  . GERD  . HIATAL HERNIA  . CONSTIPATION  . IRRITABLE BOWEL SYNDROME  . DYSPAREUNIA  . ARTHRITIS  . LOW BACK PAIN, CHRONIC  . OSTEOPENIA  . DYSPHAGIA UNSPECIFIED  . CERVICAL STRAIN  . LUMBAR STRAIN  . COLONIC POLYPS, ADENOMATOUS, HX OF  . Chronic constipation  . GERD (gastroesophageal reflux disease)  . Leukopenia  . Pain in joint, shoulder region  . Muscle weakness (generalized)  . Frozen shoulder    End of  Session Activity Tolerance: Patient tolerated treatment well General Behavior During Session: [facility] for tasks performed Cognition: [facility] for tasks performed  GO Functional Assessment  Tool Used: UEFI scored 73% I level and 27% impairment level Functional Limitation: Carrying, moving and handling objects Carrying, Moving and Handling Objects Goal Status (Z6109): At least 1 percent but less than 20 percent impaired, limited or restricted Carrying, Moving and Handling Objects Discharge Status [id number]): At least 20 percent but less than 40 percent impaired, limited or restricted  Theophilus Bones L 04/21/2012, 10:17 AM

## 2012-04-23 ENCOUNTER — Ambulatory Visit (HOSPITAL_COMMUNITY): Payer: Medicare Other | Admitting: Occupational Therapy

## 2012-10-30 ENCOUNTER — Ambulatory Visit (INDEPENDENT_AMBULATORY_CARE_PROVIDER_SITE_OTHER): Payer: Medicare Other | Admitting: Otolaryngology

## 2012-10-30 DIAGNOSIS — H9209 Otalgia, unspecified ear: Secondary | ICD-10-CM

## 2012-10-30 DIAGNOSIS — H6120 Impacted cerumen, unspecified ear: Secondary | ICD-10-CM

## 2013-01-15 ENCOUNTER — Encounter: Payer: Self-pay | Admitting: Gastroenterology

## 2013-01-15 ENCOUNTER — Ambulatory Visit (INDEPENDENT_AMBULATORY_CARE_PROVIDER_SITE_OTHER): Payer: Medicare Other | Admitting: Gastroenterology

## 2013-01-15 VITALS — BP 124/78 | HR 74 | Ht 66.0 in | Wt 150.0 lb

## 2013-01-15 DIAGNOSIS — E739 Lactose intolerance, unspecified: Secondary | ICD-10-CM

## 2013-01-15 DIAGNOSIS — K222 Esophageal obstruction: Secondary | ICD-10-CM

## 2013-01-15 DIAGNOSIS — K219 Gastro-esophageal reflux disease without esophagitis: Secondary | ICD-10-CM

## 2013-01-15 DIAGNOSIS — K589 Irritable bowel syndrome without diarrhea: Secondary | ICD-10-CM

## 2013-01-15 MED ORDER — DEXLANSOPRAZOLE 60 MG PO CPDR: 60.0000 mg | DELAYED_RELEASE_CAPSULE | Freq: Every day | ORAL | Status: DC

## 2013-01-15 NOTE — Patient Instructions (Signed)
You have been scheduled for an endoscopy with propofol. Please follow written instructions given to you at your visit today. If you use inhalers (even only as needed), please bring them with you on the day of your procedure. Your physician has requested that you go to www.startemmi.com and enter the access code given to you at your visit today. This web site gives a general overview about your procedure. However, you should still follow specific instructions given to you by our office regarding your preparation for the procedure.  Art[employer] Sweet[employer] information given for your review   We have given you samples of the following medication to take: Dexilant 60 mg, please take one capsule by mouth once daily Stop your Omeprazole

## 2013-01-15 NOTE — Progress Notes (Signed)
This is a 72 year old African American female has chronic IBS gas and bloating and suspected lactose intolerance.  Bloating is much worse with frequent use of diet gum.  Her main complaint today is increasing acid reflux since changing from Dexilant 60 mg a day Prilosec 40 mg a day her primary care physician.  She has burning substernal chest pain, regurgitation, and has had progressive solid food dysphagia over the last 2 months.  She denies a specific hepatobiliary, or lower GI, or general medical problems otherwise.  She has chronic functional constipation uses when necessary MiraLax, Norvasc 5 mg a day for hypertension, and daily Lipitor 10 mg.  Current Medications, Allergies, Past Medical History, Past Surgical History, Family History and Social History were reviewed in [employer] record.  ROS: All systems were reviewed and are negative unless otherwise stated in the HPI.          Physical Exam: Blood pressure 124/78, pulse 74 and regular and weight under and 50 pounds the BMI of 24.22.  Examination oral pharyngeal area is unremarkable.  Neck exam shows no thyromegaly, masses or tenderness.  Chest is clear and she is in a regular rhythm without murmurs gallops or rubs.  Abdominal exam shows no organomegaly, masses, tenderness, and bowel sounds are normal.  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  Have changed her back to Dexilant 60 mg a day and scheduled endoscopy with possible dilatation at her convenience.  I've explained to her the need to avoid major lactose products, and have given her a list of nonabsorbable carbohydrates to avoid her diet, especially diet chewing gum.  She is up-to-date on her colonoscopy exam. She is to continue other medications as listed and reviewed.

## 2013-01-21 ENCOUNTER — Telehealth: Payer: Self-pay | Admitting: *Deleted

## 2013-01-21 ENCOUNTER — Encounter: Payer: Self-pay | Admitting: Gastroenterology

## 2013-01-21 ENCOUNTER — Ambulatory Visit (AMBULATORY_SURGERY_CENTER): Payer: Medicare Other | Admitting: Gastroenterology

## 2013-01-21 VITALS — BP 109/70 | HR 59 | Temp 97.0°F | Resp 14 | Ht 66.0 in | Wt 150.0 lb

## 2013-01-21 DIAGNOSIS — R1319 Other dysphagia: Secondary | ICD-10-CM

## 2013-01-21 DIAGNOSIS — R1310 Dysphagia, unspecified: Secondary | ICD-10-CM

## 2013-01-21 DIAGNOSIS — K219 Gastro-esophageal reflux disease without esophagitis: Secondary | ICD-10-CM

## 2013-01-21 DIAGNOSIS — K222 Esophageal obstruction: Secondary | ICD-10-CM

## 2013-01-21 MED ORDER — SODIUM CHLORIDE 0.9 % IV SOLN: 500.0000 mL | INTRAVENOUS | Status: DC

## 2013-01-21 NOTE — Patient Instructions (Addendum)
YOU HAD AN ENDOSCOPIC PROCEDURE TODAY AT THE [facility]: Refer to the procedure report that was given to you for any specific questions about what was found during the examination.  If the procedure report does not answer your questions, please call your gastroenterologist to clarify.  If you requested that your care partner not be given the details of your procedure findings, then the procedure report has been included in a sealed envelope for you to review at your convenience later.  YOU SHOULD EXPECT: Some feelings of bloating in the abdomen. Passage of more gas than usual.  Walking can help get rid of the air that was put into your GI tract during the procedure and reduce the bloating. If you had a lower endoscopy (such as a colonoscopy or flexible sigmoidoscopy) you may notice spotting of blood in your stool or on the toilet paper. If you underwent a bowel prep for your procedure, then you may not have a normal bowel movement for a few days.  DIET: Your first meal following the procedure should be a light meal and then it is ok to progress to your normal diet.  A half-sandwich or bowl of soup is an example of a good first meal.  Heavy or fried foods are harder to digest and may make you feel nauseous or bloated.  Likewise meals heavy in dairy and vegetables can cause extra gas to form and this can also increase the bloating.  Drink plenty of fluids but you should avoid alcoholic beverages for 24 hours.  ACTIVITY: Your care partner should take you home directly after the procedure.  You should plan to take it easy, moving slowly for the rest of the day.  You can resume normal activity the day after the procedure however you should NOT DRIVE or use heavy machinery for 24 hours (because of the sedation medicines used during the test).    SYMPTOMS TO REPORT IMMEDIATELY: A gastroenterologist can be reached at any hour.  During normal business hours, 8:30 AM to 5:00 PM Monday through Friday,  call [phone].  After hours and on weekends, please call the GI answering service at [phone] who will take a message and have the physician on call contact you.    Following upper endoscopy (EGD)  Vomiting of blood or coffee ground material  New chest pain or pain under the shoulder blades  Painful or persistently difficult swallowing  New shortness of breath  Fever of 100F or higher  Black, tarry-looking stools  FOLLOW UP: If any biopsies were taken you will be contacted by phone or by letter within the next 1-3 weeks.  Call your gastroenterologist if you have not heard about the biopsies in 3 weeks.  Our staff will call the home number listed on your records the next business day following your procedure to check on you and address any questions or concerns that you may have at that time regarding the information given to you following your procedure. This is a courtesy call and so if there is no answer at the home number and we have not heard from you through the emergency physician on call, we will assume that you have returned to your regular daily activities without incident.  SIGNATURES/CONFIDENTIALITY: You and/or your care partner have signed paperwork which will be entered into your electronic medical record.  These signatures attest to the fact that that the information above on your After Visit Summary has been reviewed and is understood.  Full   responsibility of the confidentiality of this discharge information lies with you and/or your care-partner.  Post dilation diet given.  Continue current medications  Dr. Maris Berger office will set up an esophageal manometry test to test your muscle funciton in the esophagus.

## 2013-01-21 NOTE — Progress Notes (Signed)
Called to room to assist during endoscopic procedure.  Patient ID and intended procedure confirmed with present staff. Received instructions for my participation in the procedure from the performing physician.  

## 2013-01-21 NOTE — Progress Notes (Signed)
Patient did not experience any of the following events: a burn prior to discharge; a fall within the facility; wrong site/side/patient/procedure/implant event; or a hospital transfer or hospital admission upon discharge from the facility. (G8907) Patient did not have preoperative order for IV antibiotic SSI prophylaxis. (G8918)  

## 2013-01-21 NOTE — Op Note (Signed)
 [facility] [address] [place], [address]   ENDOSCOPY PROCEDURE REPORT  PATIENT: Monica [name], Monica [name]  MR#: [id number] BIRTHDATE: 01/30/1941 , 72  yrs. old GENDER: Female ENDOSCOPIST:David Hale Bogus, MD, Clementeen Graham REFERRED BY: Franchot Heidelberg, M.D. PROCEDURE DATE:  01/21/2013 PROCEDURE:   EGD, diagnostic and Maloney dilation of esophagus ASA CLASS:    Class II INDICATIONS: Dysphagia. MEDICATION: Propofol (Diprivan) 120 mg IV TOPICAL ANESTHETIC:  DESCRIPTION OF PROCEDURE:   After the risks and benefits of the procedure were explained, informed consent was obtained.  The LB [id number]  endoscope was introduced through the mouth  and advanced to the second portion of the duodenum .  The instrument was slowly withdrawn as the mucosa was fully examined.      DUODENUM: The duodenal mucosa showed no abnormalities in the bulb and second portion of the duodenum.  STOMACH: The mucosa of the stomach appeared normal.  ESOPHAGUS: The mucosa of the esophagus appeared normal. Retroflexed views revealed a 3 cm. hiatal hernia.    The scope was then withdrawn from the patient and the procedure completed.  COMPLICATIONS: There were no complications.   ENDOSCOPIC IMPRESSION: 1.   The duodenal mucosa showed no abnormalities in the bulb and second portion of the duodenum 2.   The mucosa of the stomach appeared normal 3.   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  Continue current medications 2.  Continue PPI 3.  Dilatations PRN 4.  My office will arrange for you to have an esophageal manometry test performed.  This is an test of you muscle function of your esophagus.    _______________________________ eSignedMardella Layman, MD, [facility] 01/21/2013 10:09 AM

## 2013-01-21 NOTE — Progress Notes (Signed)
Report to pacu rn, vss, bbs=clear 

## 2013-01-22 ENCOUNTER — Telehealth: Payer: Self-pay | Admitting: *Deleted

## 2013-01-22 NOTE — Telephone Encounter (Signed)
  Follow up Call-  Call back number 01/21/2013  Post procedure Call Back phone  # [phone]  Permission to leave phone message Yes     Patient questions:  Do you have a fever, pain , or abdominal swelling? no Pain Score  0 *  Have you tolerated food without any problems? yes  Have you been able to return to your normal activities? yes  Do you have any questions about your discharge instructions: Diet   no Medications  no Follow up visit  no  Do you have questions or concerns about your Care? no  Actions: * If pain score is 4 or above: No action needed, pain <4.

## 2013-01-23 NOTE — Telephone Encounter (Signed)
Informed pt Dr Jarold Motto would like for her to have an EM; she stated understanding. Mailed her instructions for prep and her appt date.

## 2013-01-29 ENCOUNTER — Telehealth: Payer: Self-pay | Admitting: Gastroenterology

## 2013-01-29 MED ORDER — DEXLANSOPRAZOLE 60 MG PO CPDR: 60.0000 mg | DELAYED_RELEASE_CAPSULE | Freq: Every day | ORAL | Status: DC

## 2013-01-29 NOTE — Telephone Encounter (Signed)
RX sent

## 2013-02-09 ENCOUNTER — Ambulatory Visit (HOSPITAL_COMMUNITY)
Admission: RE | Admit: 2013-02-09 | Discharge: 2013-02-09 | Disposition: A | Discharge status: Home / Self Care | Payer: Medicare Other | Source: Ambulatory Visit | Attending: Gastroenterology | Admitting: Gastroenterology

## 2013-02-09 ENCOUNTER — Encounter (HOSPITAL_COMMUNITY)
Admission: RE | Disposition: A | Discharge status: Home / Self Care | Payer: Self-pay | Source: Ambulatory Visit | Attending: Gastroenterology

## 2013-02-09 DIAGNOSIS — R1310 Dysphagia, unspecified: Secondary | ICD-10-CM | POA: Insufficient documentation

## 2013-02-09 HISTORY — PX: ESOPHAGEAL MANOMETRY: SHX5429

## 2013-02-09 SURGERY — MANOMETRY, ESOPHAGUS
Anesthesia: Topical

## 2013-02-09 MED ORDER — LIDOCAINE VISCOUS 2 % MT SOLN
OROMUCOSAL | Status: AC
  Filled 2013-02-09: qty 15

## 2013-02-09 SURGICAL SUPPLY — 4 items
DRAPE UTILITY 15X26 W/TAPE STR (DRAPE) ×2 IMPLANT
GLOVE BIOGEL PI IND STRL 8 (GLOVE) ×1 IMPLANT
GLOVE BIOGEL PI INDICATOR 8 (GLOVE) ×1
GOWN PREVENTION PLUS LG XLONG (DISPOSABLE) ×2 IMPLANT

## 2013-02-10 ENCOUNTER — Encounter (HOSPITAL_COMMUNITY): Payer: Self-pay | Admitting: Gastroenterology

## 2013-02-12 ENCOUNTER — Telehealth: Payer: Self-pay | Admitting: *Deleted

## 2013-02-12 NOTE — Telephone Encounter (Signed)
Notified pt her EM was normal and offered her an appt to come and discuss the test , but she states she's doing all right at the moment.

## 2013-03-06 ENCOUNTER — Other Ambulatory Visit (INDEPENDENT_AMBULATORY_CARE_PROVIDER_SITE_OTHER): Payer: Medicare Other

## 2013-03-06 ENCOUNTER — Ambulatory Visit (INDEPENDENT_AMBULATORY_CARE_PROVIDER_SITE_OTHER): Payer: Medicare Other | Admitting: Internal Medicine

## 2013-03-06 ENCOUNTER — Encounter: Payer: Self-pay | Admitting: Internal Medicine

## 2013-03-06 VITALS — BP 130/82 | HR 77 | Temp 98.0°F | Ht 66.0 in | Wt 152.2 lb

## 2013-03-06 DIAGNOSIS — K219 Gastro-esophageal reflux disease without esophagitis: Secondary | ICD-10-CM

## 2013-03-06 DIAGNOSIS — M899 Disorder of bone, unspecified: Secondary | ICD-10-CM

## 2013-03-06 DIAGNOSIS — F419 Anxiety disorder, unspecified: Secondary | ICD-10-CM

## 2013-03-06 DIAGNOSIS — E785 Hyperlipidemia, unspecified: Secondary | ICD-10-CM

## 2013-03-06 DIAGNOSIS — F411 Generalized anxiety disorder: Secondary | ICD-10-CM

## 2013-03-06 DIAGNOSIS — R7309 Other abnormal glucose: Secondary | ICD-10-CM

## 2013-03-06 DIAGNOSIS — R58 Hemorrhage, not elsewhere classified: Secondary | ICD-10-CM

## 2013-03-06 DIAGNOSIS — I10 Essential (primary) hypertension: Secondary | ICD-10-CM

## 2013-03-06 DIAGNOSIS — K5900 Constipation, unspecified: Secondary | ICD-10-CM

## 2013-03-06 LAB — COMPREHENSIVE METABOLIC PANEL
AST: 21 U/L (ref 0–37)
Albumin: 4.2 g/dL (ref 3.5–5.2)
Alkaline Phosphatase: 58 U/L (ref 39–117)
BUN: 9 mg/dL (ref 6–23)
CO2: 29 mEq/L (ref 19–32)
Creatinine, Ser: 0.8 mg/dL (ref 0.4–1.2)
GFR: 89.73 mL/min (ref >60.00)
Glucose, Bld: 101 mg/dL — ABNORMAL HIGH (ref 70–99)
Potassium: 4.3 mEq/L (ref 3.5–5.1)
Total Bilirubin: 0.7 mg/dL (ref 0.3–1.2)

## 2013-03-06 LAB — CBC
HCT: 34.5 % — ABNORMAL LOW (ref 36.0–46.0)
Hemoglobin: 11.3 g/dL — ABNORMAL LOW (ref 12.0–15.0)
MCHC: 32.7 g/dL (ref 30.0–36.0)
MCV: 85.4 fl (ref 78.0–100.0)
Platelets: 312 10*3/uL (ref 150.0–400.0)
RBC: 4.03 Mil/uL (ref 3.87–5.11)

## 2013-03-06 LAB — LIPID PANEL
Cholesterol: 150 mg/dL (ref 0–200)
HDL: 60.3 mg/dL (ref >39.00)
LDL Cholesterol: 68 mg/dL (ref 0–99)
Triglycerides: 110 mg/dL (ref 0.0–149.0)

## 2013-03-06 MED ORDER — DEXLANSOPRAZOLE 60 MG PO CPDR: 60.0000 mg | DELAYED_RELEASE_CAPSULE | Freq: Every day | ORAL | Status: DC

## 2013-03-06 MED ORDER — AMLODIPINE BESYLATE 5 MG PO TABS: 5.0000 mg | ORAL_TABLET | Freq: Every day | ORAL | Status: DC

## 2013-03-06 NOTE — Progress Notes (Signed)
HPI: Pt presents to the office today to establish care. Pt is transferring care from Dr. Monna Fam office in [place], [place]. Pt does endorses some concerns of light vaginal vs rectal bleeding that she notices from time to time. Pt had vaginal biopsy in August, due to follow next month with GYN. Pt denies vaginal pain, odor, or discharge. Pt does endorse frequent constipation, in which she takes a daily stool softener, denies diarrhea, abdominal pain, or n/v. Pt denies any other concerns. Pt states she takes her medications as prescribed and denies any others with current medications. Pt states she occasionally walks at her local mall and tries to maintain a healthy diet of mixed fruit and vegetables.   Colonoscopy: July 2014 Eye Exam: October 2013; appt. November 2014 Dentist: July 2014 Bone Density scan: 2009; scheduled new today PAP smear: 2013: scheduled nov. 2014 Mammogram: 2013: scheduled nov. 2014 Tetanus: Refused Flu shot: Refused Zostavax: Refused Pneumovax: Refused   Past Medical History  Diagnosis Date  . Hypertension   . GERD (gastroesophageal reflux disease)   . Hyperlipemia   . 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 Hiatal hernia 2010  . Stricture and stenosis of esophagus 2006  . Arthritis   . Pain in joint, shoulder region 03/03/2012  . OSTEOPENIA 09/12/2007    Qualifier: Diagnosis of  By: Erby Pian MD, Remi Haggard    . Muscle weakness (generalized) 03/03/2012  . Mixed hyperlipidemia 01/14/2009    Qualifier: Diagnosis of  By: Erby Pian MD, Remi Haggard    . LOW BACK PAIN, CHRONIC 05/07/2007    Qualifier: Diagnosis of  By: Erby Pian MD, Remi Haggard    . Leukopenia 02/27/2011  . HIATAL HERNIA 11/11/2008    Qualifier: Diagnosis of  By: Koleen Distance CMA (AAMA), Hulan Saas    . 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 GLAUCOMA 05/07/2007    Qualifier: Diagnosis of  By: Erby Pian MD, Remi Haggard    . FIBROIDS, UTERUS 02/27/2008   Qualifier: Diagnosis of  By: Erby Pian MD, Remi Haggard    . DEPRESSION, SITUATIONAL 12/03/2008    Qualifier: Diagnosis of  By: Erby Pian MD, Remi Haggard    . CONSTIPATION 12/29/2007    Qualifier: Diagnosis of  By: Erby Pian MD, Remi Haggard    . COLONIC POLYPS, ADENOMATOUS, HX OF 11/11/2008    Qualifier: Diagnosis of  By: Koleen Distance CMA (AAMA), Hulan Saas    . Chronic constipation 02/27/2011  . CERVICAL STRAIN 12/15/2008    Qualifier: Diagnosis of  By: Erby Pian MD, Remi Haggard    . ARTHRITIS 05/07/2007    Qualifier: Diagnosis of  By: Erby Pian MD, Remi Haggard    . ANXIETY 12/15/2008    Qualifier: Diagnosis of  By: Erby Pian MD, Remi Haggard      Current Outpatient Prescriptions  Medication Sig Dispense Refill  . amLODipine (NORVASC) 5 MG tablet Take 5 mg by mouth daily.        [name] aspirin 81 MG tablet Take 81 mg by mouth daily.        [name] atorvastatin (LIPITOR) 10 MG tablet Take 10 mg by mouth daily.      . carboxymethylcellulose (REFRESH PLUS) 0.5 % SOLN 1 drop 3 (three) times daily as needed. For Dry Eyes.      . Cholecalciferol (VITAMIN D) 400 UNITS capsule Take 400 Units by mouth daily.        [name] dexlansoprazole (DEXILANT) 60 MG capsule Take 1 capsule (60 mg total) by mouth daily.  30 capsule  11  .  docusate sodium (COLACE) 100 MG capsule Take 100 mg by mouth 2 (two) times daily.       No current facility-administered medications for this visit.    Allergies  Allergen Reactions  . Codeine     REACTION: Upset stomach  . Hydrochlorothiazide     REACTION: Nausea and headache    Family History  Problem Relation Age of Onset  . Heart failure Father   . Diabetes Mother   . Breast cancer Sister   . Colon cancer Sister 9  . Diabetes Brother     History   Social History  . Marital Status: Divorced    Spouse Name: N/A    Number of Children: 5  . Years of Education: N/A   Occupational History  . retired    Social History Main Topics  . Smoking status: Never Smoker   . Smokeless tobacco: Never Used  .  Alcohol Use: No  . Drug Use: No  . Sexual Activity: No   Other Topics Concern  . Not on file   Social History Narrative  . No narrative on file    ROS:  Constitutional: Denies fever, malaise, fatigue, headache or abrupt weight changes.  HEENT: Denies eye pain, eye redness, ear pain, ringing in the ears, wax buildup, runny nose, nasal congestion, bloody nose, or sore throat. Respiratory: Denies difficulty breathing, shortness of breath, cough or sputum production.   Cardiovascular: Denies chest pain, chest tightness, palpitations or swelling in the hands or feet.  Gastrointestinal:Endorses constipation. Rectal bleeding? Denies abdominal pain, bloating, constipation, diarrhea or blood in the stool.  GU: Denies frequency, urgency, pain with urination, blood in urine, odor or discharge. UYQ:[id number] vaginal bleeding? Denies pain, discharge, or odor. Musculoskeletal: Denies decrease in range of motion, difficulty with gait, muscle pain or joint pain and swelling.  Skin: Denies redness, rashes, lesions or ulcercations.  Neurological: Denies dizziness, difficulty with memory, difficulty with speech or problems with balance and coordination.   No other specific complaints in a complete review of systems (except as listed in HPI above).  PE:  BP 130/82  Pulse 77  Temp(Src) 98 F (36.7 C) (Oral)  Ht 5\' 6"  (1.676 m)  Wt 152 lb 3.2 oz (69.037 kg)  BMI 24.58 kg/m2  SpO2 96%  PF 66 L/min Wt Readings from Last 3 Encounters:  03/06/13 152 lb 3.2 oz (69.037 kg)  01/21/13 150 lb (68.04 kg)  01/15/13 150 lb (68.04 kg)    General: Appears their stated age, well developed, well nourished in NAD. HEENT: Head: normal shape and size; Eyes: sclera white, no icterus, conjunctiva pink, PERRLA and EOMs intact; Ears: Tm's gray and intact, normal light reflex; Nose: mucosa pink and moist, septum midline; Throat/Mouth: Teeth present, mucosa pink and moist, no lesions or ulcerations noted.  Neck: Normal  range of motion. Neck supple, trachea midline. No massses, lumps or thyromegaly present.  Cardiovascular: Normal rate and rhythm. S1,S2 noted.  No murmur, rubs or gallops noted. No JVD or BLE edema. No carotid bruits noted. Pulmonary/Chest: Normal effort and positive vesicular breath sounds. No respiratory distress. No wheezes, rales or ronchi noted.  Abdomen: Soft and nontender. Normal bowel sounds, no bruits noted. No active rectal bleeding/tearing noted.  No distention or masses noted. Liver, spleen and kidneys non palpable. Musculoskeletal: Normal range of motion. No signs of joint swelling. No difficulty with gait.  Neurological: Alert and oriented. Cranial nerves II-XII intact. Coordination normal. +DTRs bilaterally. Psychiatric: Mood and affect normal. Behavior is normal. Judgment  and thought content normal.    Assessment and Plan: Hypertension: Well controlled Continue current medications Increase exercise/establish a routine  Hyperlipidemia Check lipid panel today Continue current medications  Bleeding: Hemacult negative in office Pt declined vaginal exam today; scheduled PAP smear next month per GYN  Health Maintenance: Check CBC, CMET, Lipid Panel, A1C Pt declines all vaccines; educated on importance of taking them Ordered Bone Density exam to follow up with mild bone loss found previously Will call results Educated on diet and exercise plan  Arturo Morton, Farah Benish S, Student-NP

## 2013-03-06 NOTE — Assessment & Plan Note (Signed)
Will recheck lipid profile Continue Lipitor

## 2013-03-06 NOTE — Assessment & Plan Note (Signed)
Will repeat bone scan and check Vit D

## 2013-03-06 NOTE — Progress Notes (Signed)
HPI  Pt presents to the clinic today to establish care. She is transferring care from Dr. Roswell Miners office in [place]. Pt does c/o of some light bleeding. She is not sure if it vaginal or rectal. She does report have a biopsy of a place near her vagina in July 2014. She is unsure if the bleeding is coming from this.  Flu: refuses Tetanus: refuses Pneumovax: refuses Zostovax: refuses Pap Smear: scheduled 03/2013 Mammogram: scheduled 03/2013 Colonoscopy: 2014- normal Bone Scan: 2009 Eye doctor: scheduled 03/2013 Dentist: July 2014  Past Medical History  Diagnosis Date  . Hypertension   . GERD (gastroesophageal reflux disease)   . Hyperlipemia   . 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 Hiatal hernia 2010  . Stricture and stenosis of esophagus 2006  . Arthritis   . Pain in joint, shoulder region 03/03/2012  . OSTEOPENIA 09/12/2007    Qualifier: Diagnosis of  By: Erby Pian MD, Remi Haggard    . Muscle weakness (generalized) 03/03/2012  . Mixed hyperlipidemia 01/14/2009    Qualifier: Diagnosis of  By: Erby Pian MD, Remi Haggard    . LOW BACK PAIN, CHRONIC 05/07/2007    Qualifier: Diagnosis of  By: Erby Pian MD, Remi Haggard    . Leukopenia 02/27/2011  . HIATAL HERNIA 11/11/2008    Qualifier: Diagnosis of  By: Koleen Distance CMA (AAMA), Hulan Saas    . 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 GLAUCOMA 05/07/2007    Qualifier: Diagnosis of  By: Erby Pian MD, Remi Haggard    . FIBROIDS, UTERUS 02/27/2008    Qualifier: Diagnosis of  By: Erby Pian MD, Remi Haggard    . DEPRESSION, SITUATIONAL 12/03/2008    Qualifier: Diagnosis of  By: Erby Pian MD, Remi Haggard    . CONSTIPATION 12/29/2007    Qualifier: Diagnosis of  By: Erby Pian MD, Remi Haggard    . COLONIC POLYPS, ADENOMATOUS, HX OF 11/11/2008    Qualifier: Diagnosis of  By: Koleen Distance CMA (AAMA), Hulan Saas    . Chronic constipation 02/27/2011  . CERVICAL STRAIN 12/15/2008    Qualifier: Diagnosis of  By: Erby Pian MD,  Remi Haggard    . ARTHRITIS 05/07/2007    Qualifier: Diagnosis of  By: Erby Pian MD, Remi Haggard    . ANXIETY 12/15/2008    Qualifier: Diagnosis of  By: Erby Pian MD, Remi Haggard      Current Outpatient Prescriptions  Medication Sig Dispense Refill  . amLODipine (NORVASC) 5 MG tablet Take 5 mg by mouth daily.        [name] aspirin 81 MG tablet Take 81 mg by mouth daily.        [name] atorvastatin (LIPITOR) 10 MG tablet Take 10 mg by mouth daily.      . carboxymethylcellulose (REFRESH PLUS) 0.5 % SOLN 1 drop 3 (three) times daily as needed. For Dry Eyes.      . Cholecalciferol (VITAMIN D) 400 UNITS capsule Take 400 Units by mouth daily.        [name] dexlansoprazole (DEXILANT) 60 MG capsule Take 1 capsule (60 mg total) by mouth daily.  30 capsule  11  . docusate sodium (COLACE) 100 MG capsule Take 100 mg by mouth 2 (two) times daily.       No current facility-administered medications for this visit.    Allergies  Allergen Reactions  . Codeine     REACTION: Upset stomach  . Hydrochlorothiazide     REACTION: Nausea and headache    Family History  Problem Relation Age of Onset  .  Heart failure Father   . Diabetes Mother   . Breast cancer Sister   . Colon cancer Sister 68  . Diabetes Brother     History   Social History  . Marital Status: Divorced    Spouse Name: N/A    Number of Children: 5  . Years of Education: N/A   Occupational History  . retired    Social History Main Topics  . Smoking status: Never Smoker   . Smokeless tobacco: Never Used  . Alcohol Use: No  . Drug Use: No  . Sexual Activity: Not on file   Other Topics Concern  . Not on file   Social History Narrative  . No narrative on file    ROS:  Constitutional: Denies fever, malaise, fatigue, headache or abrupt weight changes.  HEENT: Denies eye pain, eye redness, ear pain, ringing in the ears, wax buildup, runny nose, nasal congestion, bloody nose, or sore throat. Respiratory: Denies difficulty breathing, shortness of  breath, cough or sputum production.   Cardiovascular: Denies chest pain, chest tightness, palpitations or swelling in the hands or feet.  Gastrointestinal: Pt reports light bleeding (unsure rectal/GU) Denies abdominal pain, bloating, constipation, diarrhea or blood in the stool.  GU: Denies frequency, urgency, pain with urination, blood in urine, odor or discharge. Musculoskeletal: Denies decrease in range of motion, difficulty with gait, muscle pain or joint pain and swelling.  Skin: Denies redness, rashes, lesions or ulcercations.  Neurological: Denies dizziness, difficulty with memory, difficulty with speech or problems with balance and coordination.   No other specific complaints in a complete review of systems (except as listed in HPI above).  PE:  BP 130/82  Pulse 77  Temp(Src) 98 F (36.7 C) (Oral)  Ht 5\' 6"  (1.676 m)  Wt 152 lb 3.2 oz (69.037 kg)  BMI 24.58 kg/m2  SpO2 96%  PF 66 L/min Wt Readings from Last 3 Encounters:  03/06/13 152 lb 3.2 oz (69.037 kg)  01/21/13 150 lb (68.04 kg)  01/15/13 150 lb (68.04 kg)    General: Appears her stated age, well developed, well nourished in NAD. HEENT: Head: normal shape and size; Eyes: sclera white, no icterus, conjunctiva pink, PERRLA and EOMs intact; Ears: Tm's gray and intact, normal light reflex; Nose: mucosa pink and moist, septum midline; Throat/Mouth: Teeth present, mucosa pink and moist, no lesions or ulcerations noted.  Neck: Normal range of motion. Neck supple, trachea midline. No massses, lumps or thyromegaly present.  Cardiovascular: Normal rate and rhythm. S1,S2 noted.  No murmur, rubs or gallops noted. No JVD or BLE edema. No carotid bruits noted. Pulmonary/Chest: Normal effort and positive vesicular breath sounds. No respiratory distress. No wheezes, rales or ronchi noted.  Abdomen: Soft and nontender. Normal bowel sounds, no bruits noted. No distention or masses noted. Liver, spleen and kidneys non  palpable. Musculoskeletal: Normal range of motion. No signs of joint swelling. No difficulty with gait.  Neurological: Alert and oriented. Cranial nerves II-XII intact. Coordination normal. +DTRs bilaterally. Psychiatric: Mood and affect normal. Behavior is normal. Judgment and thought content normal.    BMET    Component Value Date/Time   NA 138 07/11/2011 0943   K 3.9 07/11/2011 0943   CL 102 07/11/2011 0943   CO2 29 07/11/2011 0943   GLUCOSE 111* 07/11/2011 0943   BUN 10 07/11/2011 0943   CREATININE 0.77 07/11/2011 0943   CALCIUM 9.4 07/11/2011 0943   GFRNONAA 84* 07/11/2011 0943   GFRAA >90 07/11/2011 0943    Lipid  Panel     Component Value Date/Time   CHOL 213* 08/31/2008 2114   TRIG 115 08/31/2008 2114   HDL 55 08/31/2008 2114   CHOLHDL 3.9 Ratio 08/31/2008 2114   VLDL 23 08/31/2008 2114   LDLCALC 135* 08/31/2008 2114    CBC    Component Value Date/Time   WBC 3.7* 07/11/2011 0944   RBC 4.25 07/11/2011 0944   HGB 12.4 07/11/2011 0944   HCT 39.2 07/11/2011 0944   PLT 257 07/11/2011 0944   MCV 92.2 07/11/2011 0944   MCH 29.2 07/11/2011 0944   MCHC 31.6 07/11/2011 0944   RDW 14.2 07/11/2011 0944   LYMPHSABS 1.8 02/27/2011 1021   MONOABS 0.2 02/27/2011 1021   EOSABS 0.0 02/27/2011 1021   BASOSABS 0.0 02/27/2011 1021    Hgb A1C No results found for this basename: HGBA1C     Assessment and Plan:  Health Maintenance:  Pt declines all preventative vaccines Otherwise all HM UTD Plan to check CBC and CMET  Light bleeding, rectal verus vaginal:  DRE normal, hemoccult negative Pt would prefer her GYN to do her vaginal exam-she will call to set up appt  Greater than 50% of time spent reviewing records, PMH, medications and discussing preventative measures such as vaccines, bone density etc  RTC in 6 months or sooner if needed

## 2013-03-06 NOTE — Assessment & Plan Note (Signed)
Well controlled on Norvasc.

## 2013-03-06 NOTE — Patient Instructions (Signed)

## 2013-03-06 NOTE — Assessment & Plan Note (Signed)
Well controlled on Dexilant

## 2013-03-07 LAB — VITAMIN D 25 HYDROXY (VIT D DEFICIENCY, FRACTURES): Vit D, 25-Hydroxy: 40 ng/mL (ref 30–89)

## 2013-03-09 ENCOUNTER — Ambulatory Visit (INDEPENDENT_AMBULATORY_CARE_PROVIDER_SITE_OTHER): Payer: Medicare Other | Admitting: Internal Medicine

## 2013-03-09 ENCOUNTER — Encounter: Payer: Self-pay | Admitting: Internal Medicine

## 2013-03-09 VITALS — BP 122/78 | HR 85 | Temp 97.6°F | Ht 66.0 in | Wt 152.0 lb

## 2013-03-09 DIAGNOSIS — E119 Type 2 diabetes mellitus without complications: Secondary | ICD-10-CM

## 2013-03-09 DIAGNOSIS — E118 Type 2 diabetes mellitus with unspecified complications: Secondary | ICD-10-CM | POA: Insufficient documentation

## 2013-03-09 MED ORDER — METFORMIN HCL 500 MG PO TABS: 500.0000 mg | ORAL_TABLET | Freq: Every day | ORAL | Status: DC

## 2013-03-09 NOTE — Patient Instructions (Signed)
Diabetes and Standards of Medical Care  Diabetes is complicated. You may find that your diabetes team includes a dietitian, nurse, diabetes educator, eye doctor, and more. To help everyone know what is going on and to help you get the care you deserve, the following schedule of care was developed to help keep you on track. Below are the tests, exams, vaccines, medicines, education, and plans you will need. A1c test  Performed at least 2 times a year if you are meeting treatment goals.  Performed 4 times a year if therapy has changed or if you are not meeting treatment goals. Blood pressure test  Performed at every routine medical visit. The goal is less than 120/80 mmHg. Dental exam  Follow up with the dentist regularly. Eye exam  Diagnosed with type 1 diabetes as a child: Get an exam upon reaching the age of 10 years or older and having had diabetes for 3 5 years. Yearly eye exams are recommended after that initial eye exam.  Diagnosed with type 1 diabetes as an adult: Get an exam within 5 years of diagnosis and then yearly.  Diagnosed with type 2 diabetes: Get an exam as soon as possible after the diagnosis and then yearly. Foot care exam  Visual foot exams are performed at every routine medical visit. The exams check for cuts, injuries, or other problems with the feet.  A comprehensive foot exam should be done yearly. This includes visual inspection as well as assessing foot pulses and testing for loss of sensation. Kidney function test (urine microalbumin)  Performed once a year.  Type 1 diabetes: The first test is performed 5 years after diagnosis.  Type 2 diabetes: The first test is performed at the time of diagnosis.  A serum creatinine and estimated glomerular filtration rate (eGFR) test is done once a year to tell the level of chronic kidney disease (CKD), if present. Lipid profile (Cholesterol, HDL, LDL, Triglycerides)  Performed every 5 years for most people.  The  goal for LDL is less than 100 mg/dl. If at high risk, the goal is less than 70 mg/dl.  The goal for HDL is 40 mg/dl 50 mg/dl for men and 50 mg/dl 60 mg/dl for women. An HDL cholesterol of 60 mg/dL or higher gives some protection against heart disease.  The goal for triglycerides is less than 150 mg/dl. Influenza vaccine, pneumococcal vaccine, and hepatitis B vaccine  The influenza vaccine is recommended yearly.  The pneumococcal vaccine is generally given once in a lifetime. However, there are some instances when another vaccination is recommended. Check with your caregiver.  The hepatitis B vaccine is also recommended for adults with diabetes. Diabetes self-management education  Recommended at diagnosis and ongoing as needed. Treatment plan  Reviewed at every medical visit. Document Released: 02/25/2009 Document Revised: 04/16/2012 Document Reviewed: 10/31/2010 [facility] Patient Information 2014 [place], [place]. How to Avoid Diabetes Problems You can do a lot to prevent or slow down diabetes problems. Following your diabetes plan and taking care of yourself can reduce your risk of serious or life-threatening complications. Below, you will find certain things you can do to prevent diabetes problems. MANAGE YOUR DIABETES Follow your caregiver's, nurse educator's, and dietitian's instructions for managing your diabetes. They will teach you the basics of diabetes care. They can help answer questions you may have. Learn about diabetes and make healthy choices regarding eating and physical activity. Monitor your blood glucose level regularly. Your caregiver will help you decide how often to check your blood  glucose level depending on your treatment goals and how well you are meeting them.  DO NOT SMOKE Smoking and diabetes are a dangerous combination. Smoking raises your risk for diabetes problems. If you quit smoking, you will lower your risk for heart attack, stroke, nerve disease, and kidney  disease. Your cholesterol and your blood pressure levels may improve. Your blood circulation will also improve. If you smoke, ask your caregiver for help in quitting. KEEP YOUR BLOOD PRESSURE UNDER CONTROL Keeping your blood pressure under control will help prevent damage to your eyes, kidneys, heart, and blood vessels. Blood pressure consists of two numbers. The top number should be below 120, and the bottom number should be below 80 (120/80). Keep your blood pressure as close to these numbers as you can. If you already have kidney disease, you may want even lower blood pressure to protect your kidneys. Talk to your caregiver to make sure that your blood pressure goal is right for your needs. Meal planning, medicines, and exercise can help you reach your blood pressure target. Have your blood pressure checked at every visit with your caregiver. KEEP YOUR CHOLESTEROL UNDER CONTROL Normal cholesterol levels will help prevent heart disease and stroke. These are the biggest health problems for people with diabetes. Keeping cholesterol levels under control can also help with blood flow. Have your cholesterol level checked at least once a year. Meal planning, exercise, and medicines can help you reach your cholesterol targets. SCHEDULE AND KEEP YOUR ANNUAL PHYSICAL EXAMS AND EYE EXAMS Your caregiver will tell you how often he or she wants to see you depending on your plan of treatment. It is important that you keep these appointments so that possible problems can be identified early and complications can be avoided or treated.  Every visit with your caregiver should include your weight, blood pressure, and an evaluation of your blood glucose control.  Your hemoglobin A1c should be checked:  At least twice a year if you are at your goal.  Every 3 months if there are changes in treatment.  If you are not meeting your goals.  Your blood lipids should be checked yearly. You should also be checked yearly to  see if you have protein in your urine (microalbumin).  Schedule a dilated eye exam if you have type 1 diabetes within 5 years of your diagnosis and then yearly. Schedule a dilated eye exam if you have type 2 diabetes at diagnosis and then yearly. All exams thereafter can be extended to every 2 to 3 years if one or more exams have been normal. KEEP YOUR VACCINES CURRENT The flu vaccine is recommended yearly. The formula for the vaccine changes every year and needs to be updated for the best protection against current viruses. In addition, you should get a vaccination against pneumonia at least once in your life. However, there are some instances where another vaccine is recommended. Check with your caregiver. TAKE CARE OF YOUR FEET  Diabetes may cause you to have a poor blood supply (circulation) to your legs and feet. Because of this, the skin may be thinner, break easier, and heal more slowly. You also may have nerve damage in your legs and feet causing decreased feeling. You may not notice minor injuries to your feet that could lead to serious problems or infections. Taking care of your feet is very important. Visual foot exams are performed at every routine medical visit. The exams check for cuts, injuries, or other problems with the feet. A comprehensive  foot exam should be done yearly. This includes visual inspection as well as assessing foot pulses and testing for loss of sensation. You should also do the following:  Inspect your feet daily for cuts, calluses, blisters, ingrown toenails, and signs of infection, such as redness, swelling, or pus.  Wash and dry your feet thoroughly, especially between the toes.  Avoid soaking your feet regularly in hot water baths.  Moisturize dry skin with lotion, avoiding areas between your toes.  Cut toenails straight across and file the edges.  Avoid shoes that do not fit well or have areas that irritate your skin.  Avoid going barefooted or wearing only  socks. Your feet need protection. TAKE CARE OF YOUR TEETH People with poorly controlled diabetes are more likely to have gum (periodontal) disease. These infections make diabetes harder to control. Periodontal diseases, if left untreated, can lead to tooth loss. Brush your teeth twice a day, floss, and see your dentist for checkups and cleaning every 6 months, or 2 times a year. ASK YOUR CAREGIVER ABOUT TAKING ASPIRIN Taking aspirin daily is recommended to help prevent cardiovascular disease in people with and without diabetes. Ask your caregiver if this would benefit you and what dose he or she would recommend. DRINK RESPONSIBLY Moderate amounts of alcohol (less than 1 drink per day for adult women and less than 2 drinks per day for adult men) have a minimal effect on blood glucose if ingested with food. It is important to eat food with alcohol to avoid hypoglycemia. People should avoid alcohol if they have a history of alcohol abuse or dependence, if they are pregnant, and if they have liver disease, pancreatitis, advanced neuropathy, or severe hypertriglyceridemia. LESSEN STRESS Living with diabetes can be stressful. When you are under stress, your blood glucose may be affected in two ways:  Stress hormones may cause your blood glucose to rise.  You may be distracted from taking good care of yourself. It is a good idea to be aware of your stress level and make changes that are necessary to help you better manage challenging situations. Support groups, planned relaxation, a hobby you enjoy, meditation, healthy relationships, and exercise all work to lower your stress level. If your efforts do not seem to be helping, get help from your caregiver or a trained mental health professional. Document Released: 01/16/2011 Document Revised: 04/16/2012 Document Reviewed: 01/16/2011 [facility] Patient Information 2014 [place], [place].

## 2013-03-09 NOTE — Progress Notes (Signed)
HPI: Pt presents to the office today for follow up regarding lab that were taken at new patient visit last week. Pts A1C resulted at 7.0, in which she was called and asked to come in for counseling about Diabetes. Pt denies any blurry vision, increased thirst or hunger, increased urination, or numbness or tingling. Pt does have a close family history of diabetes in all of her brothers and sisters. Pt states she feels she eats well, however she does endorse skipping meals on a regular basis. She also eats a large amount of carbohydrates. Pt denies eating a lot of sweets, sugary drinks, or eating a lot of fried foods. Pt does endorse exercising some, however admits to needing to get back on a regularly routine of walking.   Past Medical History  Diagnosis Date  . Hypertension   . GERD (gastroesophageal reflux disease)   . Hyperlipemia   . 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 Hiatal hernia 2010  . Stricture and stenosis of esophagus 2006  . Arthritis   . Pain in joint, shoulder region 03/03/2012  . OSTEOPENIA 09/12/2007    Qualifier: Diagnosis of  By: Erby Pian MD, Remi Haggard    . Muscle weakness (generalized) 03/03/2012  . Mixed hyperlipidemia 01/14/2009    Qualifier: Diagnosis of  By: Erby Pian MD, Remi Haggard    . LOW BACK PAIN, CHRONIC 05/07/2007    Qualifier: Diagnosis of  By: Erby Pian MD, Remi Haggard    . Leukopenia 02/27/2011  . HIATAL HERNIA 11/11/2008    Qualifier: Diagnosis of  By: Koleen Distance CMA (AAMA), Hulan Saas    . 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 GLAUCOMA 05/07/2007    Qualifier: Diagnosis of  By: Erby Pian MD, Remi Haggard    . FIBROIDS, UTERUS 02/27/2008    Qualifier: Diagnosis of  By: Erby Pian MD, Remi Haggard    . DEPRESSION, SITUATIONAL 12/03/2008    Qualifier: Diagnosis of  By: Erby Pian MD, Remi Haggard    . CONSTIPATION 12/29/2007    Qualifier: Diagnosis of  By: Erby Pian MD, Remi Haggard    . COLONIC POLYPS, ADENOMATOUS, HX OF  11/11/2008    Qualifier: Diagnosis of  By: Koleen Distance CMA (AAMA), Hulan Saas    . Chronic constipation 02/27/2011  . CERVICAL STRAIN 12/15/2008    Qualifier: Diagnosis of  By: Erby Pian MD, Remi Haggard    . ARTHRITIS 05/07/2007    Qualifier: Diagnosis of  By: Erby Pian MD, Remi Haggard    . ANXIETY 12/15/2008    Qualifier: Diagnosis of  By: Erby Pian MD, Remi Haggard      Current Outpatient Prescriptions  Medication Sig Dispense Refill  . amLODipine (NORVASC) 5 MG tablet Take 1 tablet (5 mg total) by mouth daily.  90 tablet  1  . aspirin 81 MG tablet Take 81 mg by mouth daily.        [name] atorvastatin (LIPITOR) 10 MG tablet Take 10 mg by mouth daily.      . carboxymethylcellulose (REFRESH PLUS) 0.5 % SOLN 1 drop 3 (three) times daily as needed. For Dry Eyes.      . Cholecalciferol (VITAMIN D) 400 UNITS capsule Take 400 Units by mouth daily.        [name] dexlansoprazole (DEXILANT) 60 MG capsule Take 1 capsule (60 mg total) by mouth daily.  90 capsule  1  . docusate sodium (COLACE) 100 MG capsule Take 100 mg by mouth 2 (two) times daily.       No current facility-administered medications for  this visit.    Allergies  Allergen Reactions  . Codeine     REACTION: Upset stomach  . Hydrochlorothiazide     REACTION: Nausea and headache    Family History  Problem Relation Age of Onset  . Heart failure Father   . Diabetes Mother   . Breast cancer Sister   . Colon cancer Sister 32  . Diabetes Brother     History   Social History  . Marital Status: Divorced    Spouse Name: N/A    Number of Children: 5  . Years of Education: N/A   Occupational History  . retired    Social History Main Topics  . Smoking status: Never Smoker   . Smokeless tobacco: Never Used  . Alcohol Use: No  . Drug Use: No  . Sexual Activity: No   Other Topics Concern  . Not on file   Social History Narrative  . No narrative on file    ROS:  Constitutional: Denies fever, malaise, fatigue, headache or abrupt weight changes.   HEENT: Denies eye pain, eye redness, ear pain, ringing in the ears, wax buildup, runny nose, nasal congestion, bloody nose, or sore throat.  Cardiovascular: Denies chest pain, chest tightness, palpitations or swelling in the hands or feet.  Gastrointestinal: Denies abdominal pain, bloating, constipation, diarrhea or blood in the stool.  GU: Denies frequency, urgency, pain with urination, blood in urine, odor or discharge. Neurological: Denies dizziness, difficulty with memory, difficulty with speech or problems with balance and coordination.   No other specific complaints in a complete review of systems (except as listed in HPI above).  PE:  BP 122/78  Pulse 85  Temp(Src) 97.6 F (36.4 C) (Oral)  Ht 5\' 6"  (1.676 m)  Wt 152 lb (68.947 kg)  BMI 24.55 kg/m2  SpO2 98% Wt Readings from Last 3 Encounters:  03/09/13 152 lb (68.947 kg)  03/06/13 152 lb 3.2 oz (69.037 kg)  01/21/13 150 lb (68.04 kg)    General: Appears their stated age, well developed, well nourished in NAD. HEENT: Head: normal shape and size; Eyes: sclera white, no icterus, conjunctiva pink, PERRLA and EOMs intact; No A-V nicking, copper wiring, or hemorrhages noted;  Ears: Tm's gray and intact, normal light reflex; Nose: mucosa pink and moist, septum midline; Throat/Mouth: Teeth present, mucosa pink and moist, no lesions or ulcerations noted.  Neck: Normal range of motion. Neck supple, trachea midline. No massses, lumps or thyromegaly present.  Cardiovascular: Normal rate and rhythm. S1,S2 noted.  No murmur, rubs or gallops noted. No JVD or BLE edema. No carotid bruits noted. Abdomen: Soft and nontender. Normal bowel sounds, no bruits noted. No distention or masses noted. Liver, spleen and kidneys non palpable. Neurological: Alert and oriented. Cranial nerves II-XII intact. Coordination normal. +DTRs bilaterally. Full sensation bilaterally to Monofilament. Psychiatric: Mood and affect normal. Behavior is normal. Judgment and  thought content normal.   EKG:  BMET    Component Value Date/Time   NA 142 03/06/2013 1057   K 4.3 03/06/2013 1057   CL 107 03/06/2013 1057   CO2 29 03/06/2013 1057   GLUCOSE 101* 03/06/2013 1057   BUN 9 03/06/2013 1057   CREATININE 0.8 03/06/2013 1057   CALCIUM 9.6 03/06/2013 1057   GFRNONAA 84* 07/11/2011 0943   GFRAA >90 07/11/2011 0943    Lipid Panel     Component Value Date/Time   CHOL 150 03/06/2013 1057   TRIG 110.0 03/06/2013 1057   HDL 60.30 03/06/2013 1057  CHOLHDL 2 03/06/2013 1057   VLDL 22.0 03/06/2013 1057   LDLCALC 68 03/06/2013 1057    CBC    Component Value Date/Time   WBC 4.4* 03/06/2013 1057   RBC 4.03 03/06/2013 1057   HGB 11.3* 03/06/2013 1057   HCT 34.5* 03/06/2013 1057   PLT 312.0 03/06/2013 1057   MCV 85.4 03/06/2013 1057   MCH 29.2 07/11/2011 0944   MCHC 32.7 03/06/2013 1057   RDW 14.9* 03/06/2013 1057   LYMPHSABS 1.8 02/27/2011 1021   MONOABS 0.2 02/27/2011 1021   EOSABS 0.0 02/27/2011 1021   BASOSABS 0.0 02/27/2011 1021    Hgb A1C Lab Results  Component Value Date   HGBA1C 7.0* 03/06/2013     Assessment and Plan: Type II Diabetes: Presribed Metoformin 500mg  PO daily Educated on GI side effects, call if they do not subside in 2 weeks Educated on diet, decrease carb intake, eat three meals a day with variety fruits and vegetables, with healthy snacks in between Educated on exercise, at least 3-4 times a week 30 minutes a day Follow up in 3 months   Tali Cleaves S, Student-NP

## 2013-03-09 NOTE — Progress Notes (Signed)
Subjective:    Patient ID: Monica [name], female    DOB: 11-Sep-1940, 72 y.o.   MRN: [id number]  HPI  Pt presents to the clinic today to follow up labs. At her last visit her A1C was 7.0. She reports that she has been told in the past that her sugar was elevated but has never been treated for diabetes. She is concerned because her brother died from complications for diabetes.  Review of Systems      Past Medical History  Diagnosis Date  . Hypertension   . GERD (gastroesophageal reflux disease)   . Hyperlipemia   . 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 Hiatal hernia 2010  . Stricture and stenosis of esophagus 2006  . Arthritis   . Pain in joint, shoulder region 03/03/2012  . OSTEOPENIA 09/12/2007    Qualifier: Diagnosis of  By: Erby Pian MD, Remi Haggard    . Muscle weakness (generalized) 03/03/2012  . Mixed hyperlipidemia 01/14/2009    Qualifier: Diagnosis of  By: Erby Pian MD, Remi Haggard    . LOW BACK PAIN, CHRONIC 05/07/2007    Qualifier: Diagnosis of  By: Erby Pian MD, Remi Haggard    . Leukopenia 02/27/2011  . HIATAL HERNIA 11/11/2008    Qualifier: Diagnosis of  By: Koleen Distance CMA (AAMA), Hulan Saas    . 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 GLAUCOMA 05/07/2007    Qualifier: Diagnosis of  By: Erby Pian MD, Remi Haggard    . FIBROIDS, UTERUS 02/27/2008    Qualifier: Diagnosis of  By: Erby Pian MD, Remi Haggard    . DEPRESSION, SITUATIONAL 12/03/2008    Qualifier: Diagnosis of  By: Erby Pian MD, Remi Haggard    . CONSTIPATION 12/29/2007    Qualifier: Diagnosis of  By: Erby Pian MD, Remi Haggard    . COLONIC POLYPS, ADENOMATOUS, HX OF 11/11/2008    Qualifier: Diagnosis of  By: Koleen Distance CMA (AAMA), Hulan Saas    . Chronic constipation 02/27/2011  . CERVICAL STRAIN 12/15/2008    Qualifier: Diagnosis of  By: Erby Pian MD, Remi Haggard    . ARTHRITIS 05/07/2007    Qualifier: Diagnosis of  By: Erby Pian MD, Remi Haggard    . ANXIETY 12/15/2008    Qualifier:  Diagnosis of  By: Erby Pian MD, Remi Haggard      Current Outpatient Prescriptions  Medication Sig Dispense Refill  . amLODipine (NORVASC) 5 MG tablet Take 1 tablet (5 mg total) by mouth daily.  90 tablet  1  . aspirin 81 MG tablet Take 81 mg by mouth daily.        [name] atorvastatin (LIPITOR) 10 MG tablet Take 10 mg by mouth daily.      . carboxymethylcellulose (REFRESH PLUS) 0.5 % SOLN 1 drop 3 (three) times daily as needed. For Dry Eyes.      . Cholecalciferol (VITAMIN D) 400 UNITS capsule Take 400 Units by mouth daily.        [name] dexlansoprazole (DEXILANT) 60 MG capsule Take 1 capsule (60 mg total) by mouth daily.  90 capsule  1  . docusate sodium (COLACE) 100 MG capsule Take 100 mg by mouth 2 (two) times daily.       No current facility-administered medications for this visit.    Allergies  Allergen Reactions  . Codeine     REACTION: Upset stomach  . Hydrochlorothiazide     REACTION: Nausea and headache    Family History  Problem Relation Age of Onset  . Heart failure Father   .  Diabetes Mother   . Breast cancer Sister   . Colon cancer Sister 44  . Diabetes Brother     History   Social History  . Marital Status: Divorced    Spouse Name: N/A    Number of Children: 5  . Years of Education: N/A   Occupational History  . retired    Social History Main Topics  . Smoking status: Never Smoker   . Smokeless tobacco: Never Used  . Alcohol Use: No  . Drug Use: No  . Sexual Activity: No   Other Topics Concern  . Not on file   Social History Narrative  . No narrative on file     Constitutional: Denies fever, malaise, fatigue, headache or abrupt weight changes.  HEENT: Denies eye pain, eye redness, ear pain, ringing in the ears, wax buildup, runny nose, nasal congestion, bloody nose, or sore throat. Respiratory: Denies difficulty breathing, shortness of breath, cough or sputum production.   Cardiovascular: Denies chest pain, chest tightness, palpitations or swelling in the  hands or feet.  Gastrointestinal: Denies abdominal pain, bloating, constipation, diarrhea or blood in the stool.  GU: Denies urgency, frequency, pain with urination, burning sensation, blood in urine, odor or discharge. Musculoskeletal: Denies decrease in range of motion, difficulty with gait, muscle pain or joint pain and swelling.  Skin: Denies redness, rashes, lesions or ulcercations.  Neurological: Denies dizziness, difficulty with memory, difficulty with speech or problems with balance and coordination.   No other specific complaints in a complete review of systems (except as listed in HPI above).  Objective:   Physical Exam   BP 122/78  Pulse 85  Temp(Src) 97.6 F (36.4 C) (Oral)  Ht 5\' 6"  (1.676 m)  Wt 152 lb (68.947 kg)  BMI 24.55 kg/m2  SpO2 98% Wt Readings from Last 3 Encounters:  03/09/13 152 lb (68.947 kg)  03/06/13 152 lb 3.2 oz (69.037 kg)  01/21/13 150 lb (68.04 kg)    General: Appears their stated age, well developed, well nourished in NAD. Skin: Warm, dry and intact. No rashes, lesions or ulcerations noted. HEENT: Head: normal shape and size; Eyes: sclera white, no icterus, conjunctiva pink, PERRLA and EOMs intact; Ears: Tm's gray and intact, normal light reflex; Nose: mucosa pink and moist, septum midline; Throat/Mouth: Teeth present, mucosa pink and moist, no exudate, lesions or ulcerations noted.  Neck: Normal range of motion. Neck supple, trachea midline. No massses, lumps or thyromegaly present.  Cardiovascular: Normal rate and rhythm. S1,S2 noted.  No murmur, rubs or gallops noted. No JVD or BLE edema. No carotid bruits noted. Pulmonary/Chest: Normal effort and positive vesicular breath sounds. No respiratory distress. No wheezes, rales or ronchi noted.  Abdomen: Soft and nontender. Normal bowel sounds, no bruits noted. No distention or masses noted. Liver, spleen and kidneys non palpable. Musculoskeletal: Normal range of motion. No signs of joint swelling. No  difficulty with gait.  Neurological: Alert and oriented. Cranial nerves II-XII intact. Coordination normal. +DTRs bilaterally. Psychiatric: Mood and affect normal. Behavior is normal. Judgment and thought content normal.   EKG:  BMET    Component Value Date/Time   NA 142 03/06/2013 1057   K 4.3 03/06/2013 1057   CL 107 03/06/2013 1057   CO2 29 03/06/2013 1057   GLUCOSE 101* 03/06/2013 1057   BUN 9 03/06/2013 1057   CREATININE 0.8 03/06/2013 1057   CALCIUM 9.6 03/06/2013 1057   GFRNONAA 84* 07/11/2011 0943   GFRAA >90 07/11/2011 0943    Lipid Panel  Component Value Date/Time   CHOL 150 03/06/2013 1057   TRIG 110.0 03/06/2013 1057   HDL 60.30 03/06/2013 1057   CHOLHDL 2 03/06/2013 1057   VLDL 22.0 03/06/2013 1057   LDLCALC 68 03/06/2013 1057    CBC    Component Value Date/Time   WBC 4.4* 03/06/2013 1057   RBC 4.03 03/06/2013 1057   HGB 11.3* 03/06/2013 1057   HCT 34.5* 03/06/2013 1057   PLT 312.0 03/06/2013 1057   MCV 85.4 03/06/2013 1057   MCH 29.2 07/11/2011 0944   MCHC 32.7 03/06/2013 1057   RDW 14.9* 03/06/2013 1057   LYMPHSABS 1.8 02/27/2011 1021   MONOABS 0.2 02/27/2011 1021   EOSABS 0.0 02/27/2011 1021   BASOSABS 0.0 02/27/2011 1021    Hgb A1C Lab Results  Component Value Date   HGBA1C 7.0* 03/06/2013        Assessment & Plan:

## 2013-03-09 NOTE — Assessment & Plan Note (Signed)
New onset Discussed standards of medical care with diabetes Discussed treatment options, risk and benefits of each eRx for Metformin 500 mg Daily for now Foot exam today Will recheck A1C, cmet, lipids and microalbumin in 3 months  More than 50%of the visit spent counseling pt on the disease process, treatment options and standards of care.

## 2013-03-11 ENCOUNTER — Telehealth: Payer: Self-pay | Admitting: Internal Medicine

## 2013-03-11 NOTE — Telephone Encounter (Signed)
noted 

## 2013-03-11 NOTE — Telephone Encounter (Signed)
She may have a headache, feel dizzy, blurred vision. If she would like a meter I can prescribe her one. Her A1C was not very elevated and I don't think she needs to be checking her sugars daily unless she wants to.

## 2013-03-11 NOTE — Telephone Encounter (Signed)
Spoke with pt and inform of the massage below. Pt does not have any of those symptoms right now and does not want the meter.

## 2013-03-11 NOTE — Telephone Encounter (Signed)
Pt called stated that how would she know if her blood surgery is up? Pt had an ov 03/09/13 and was diagnose with diabetes. Pt stated that Baity didn't give her anything to check her blood sugar. Please call pt to clarify this

## 2013-03-23 ENCOUNTER — Telehealth: Payer: Self-pay | Admitting: *Deleted

## 2013-03-23 NOTE — Telephone Encounter (Signed)
She can take 2.5 mg (half her current dose) if she is nauseated. But she needs to keep a close eye on her BP

## 2013-03-23 NOTE — Telephone Encounter (Signed)
Spoke with pt advised of Regina's message. 

## 2013-03-23 NOTE — Telephone Encounter (Signed)
Pt called sates Monica [name] advised her to take 1/2 Amlodipine if she became nauseated.  Pt called for clarification.  Please advise

## 2013-04-17 ENCOUNTER — Encounter: Payer: Self-pay | Admitting: Internal Medicine

## 2013-04-17 ENCOUNTER — Ambulatory Visit (INDEPENDENT_AMBULATORY_CARE_PROVIDER_SITE_OTHER): Payer: Medicare Other | Admitting: Internal Medicine

## 2013-04-17 VITALS — BP 132/80 | HR 82 | Temp 98.7°F

## 2013-04-17 DIAGNOSIS — E119 Type 2 diabetes mellitus without complications: Secondary | ICD-10-CM

## 2013-04-17 DIAGNOSIS — K5909 Other constipation: Secondary | ICD-10-CM

## 2013-04-17 DIAGNOSIS — M545 Low back pain, unspecified: Secondary | ICD-10-CM

## 2013-04-17 DIAGNOSIS — K5900 Constipation, unspecified: Secondary | ICD-10-CM

## 2013-04-17 MED ORDER — POLYETHYLENE GLYCOL 3350 17 GM/SCOOP PO POWD: 17.0000 g | Freq: Two times a day (BID) | ORAL | Status: DC

## 2013-04-17 MED ORDER — ONETOUCH LANCETS MISC: 1.0000 | Freq: Every morning | Status: DC

## 2013-04-17 MED ORDER — GLUCOSE BLOOD VI STRP: ORAL_STRIP | Status: DC

## 2013-04-17 MED ORDER — CYCLOBENZAPRINE HCL 5 MG PO TABS: 5.0000 mg | ORAL_TABLET | Freq: Three times a day (TID) | ORAL | Status: DC | PRN

## 2013-04-17 MED ORDER — ONETOUCH ULTRA 2 W/DEVICE KIT: PACK | Status: DC

## 2013-04-17 NOTE — Patient Instructions (Signed)
It was good to see you today.  We have reviewed your prior records including labs and tests today  Use Flexeril every 8 hours as needed for muscle spasm and back pain  Stop Colace  Use MiraLAX powder twice daily for constipation treatment  Your prescription(s) have been submitted to your pharmacy. Please take as directed and contact our office if you believe you are having problem(s) with the medication(s).  We'll make referral to nutritionist to help you with diabetes and diet selection. I office will call regarding this referral  Will send prescription for glucometer and supplies to monitor your sugars once daily at home.  Check sugar each morning before breakfast and call if over 150

## 2013-04-17 NOTE — Progress Notes (Signed)
Pre-visit discussion using our clinic review tool. No additional management support is needed unless otherwise documented below in the visit note.  

## 2013-04-17 NOTE — Progress Notes (Signed)
   Subjective:    Patient ID: Monica [name], female    DOB: 01/21/1941, 72 y.o.   MRN: [id number]  Back Pain This is a recurrent problem. The current episode started in the past 7 days. The problem has been gradually improving since onset. The pain is present in the lumbar spine. The quality of the pain is described as aching. The pain does not radiate. Associated symptoms include abdominal pain. Pertinent negatives include no bladder incontinence, bowel incontinence, chest pain, dysuria, fever, leg pain, numbness, pelvic pain, perianal numbness, tingling, weakness or weight loss. Risk factors include menopause and lack of exercise. She has tried muscle relaxant and heat for the symptoms. The treatment provided mild relief.    Past Medical History  Diagnosis Date  . Hypertension   . GERD (gastroesophageal reflux disease)     w/ HH (EGD 2010)  . 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 Stricture and stenosis of esophagus 2006  . Arthritis   . OSTEOPENIA   . Mixed hyperlipidemia   . LOW BACK PAIN, CHRONIC   . GLAUCOMA   . DEPRESSION, SITUATIONAL   . Chronic constipation   . ANXIETY     Review of Systems  Constitutional: Negative for fever and weight loss.  Cardiovascular: Negative for chest pain.  Gastrointestinal: Positive for abdominal pain. Negative for bowel incontinence.  Genitourinary: Negative for bladder incontinence, dysuria and pelvic pain.  Musculoskeletal: Positive for back pain.  Neurological: Negative for tingling, weakness and numbness.       Objective:   Physical Exam BP 132/80  Pulse 82  Temp(Src) 98.7 F (37.1 C) (Oral)  SpO2 99% Wt Readings from Last 3 Encounters:  03/09/13 152 lb (68.947 kg)  03/06/13 152 lb 3.2 oz (69.037 kg)  01/21/13 150 lb (68.04 kg)   Constitutional: She appears well-developed and well-nourished. No distress.  Neck: Normal range of motion. Neck supple. No JVD present. No thyromegaly present.  Cardiovascular: Normal rate,  regular rhythm and normal heart sounds.  No murmur heard. No BLE edema. Pulmonary/Chest: Effort normal and breath sounds normal. No respiratory distress. She has no wheezes.  Musculoskeletal: Back: full range of motion of thoracic and lumbar spine. Non tender to palpation. Negative straight leg raise. DTR's are symmetrically intact. Sensation intact in all dermatomes of the lower extremities. Full strength to manual muscle testing. patient is able to heel toe walk without difficulty and ambulates with antalgic gait. Neurological: She is alert and oriented to person, place, and time. No cranial nerve deficit. Coordination, balance, strength, speech and gait are normal.  Skin: Skin is warm and dry. No rash noted. No erythema.  Psychiatric: She has a normal mood and affect. Her behavior is normal. Judgment and thought content normal.   Lab Results  Component Value Date   WBC 4.4* 03/06/2013   HGB 11.3* 03/06/2013   HCT 34.5* 03/06/2013   PLT 312.0 03/06/2013   GLUCOSE 101* 03/06/2013   CHOL 150 03/06/2013   TRIG 110.0 03/06/2013   HDL 60.30 03/06/2013   LDLCALC 68 03/06/2013   ALT 17 03/06/2013   AST 21 03/06/2013   NA 142 03/06/2013   K 4.3 03/06/2013   CL 107 03/06/2013   CREATININE 0.8 03/06/2013   BUN 9 03/06/2013   CO2 29 03/06/2013   TSH 1.56 02/27/2011   HGBA1C 7.0* 03/06/2013       Assessment & Plan:   See problem list. Medications and labs reviewed today.

## 2013-04-17 NOTE — Assessment & Plan Note (Signed)
New diagnosis October 2014 On metformin for same Last A1c at goal Refer to nutritionist Prescription for glucometer and supplies provided today Lab Results  Component Value Date   HGBA1C 7.0* 03/06/2013

## 2013-04-17 NOTE — Assessment & Plan Note (Signed)
Change daily Colace to MiraLAX twice a day Complication of IBS -history reviewed in depth today

## 2013-04-17 NOTE — Assessment & Plan Note (Signed)
Remote lumbar surgery for same reviewed No neuro or musculoskeletal deficits on exam today, no injury or falls Resume Flexeril, use as needed Patient also attributes symptoms to constipation and use of Colace -see below for medication changes recommended

## 2013-04-27 LAB — HM MAMMOGRAPHY

## 2013-06-03 ENCOUNTER — Other Ambulatory Visit: Payer: Self-pay | Admitting: Internal Medicine

## 2013-06-04 ENCOUNTER — Other Ambulatory Visit: Payer: Self-pay | Admitting: *Deleted

## 2013-06-04 MED ORDER — ATORVASTATIN CALCIUM 10 MG PO TABS: 10.0000 mg | ORAL_TABLET | Freq: Every day | ORAL | Status: DC

## 2013-06-04 MED ORDER — METFORMIN HCL 500 MG PO TABS: 500.0000 mg | ORAL_TABLET | Freq: Every day | ORAL | Status: DC

## 2013-06-09 ENCOUNTER — Encounter: Payer: Medicare HMO | Attending: Internal Medicine | Admitting: *Deleted

## 2013-06-09 ENCOUNTER — Encounter: Payer: Self-pay | Admitting: *Deleted

## 2013-06-09 VITALS — Ht 66.0 in | Wt 140.6 lb

## 2013-06-09 DIAGNOSIS — Z713 Dietary counseling and surveillance: Secondary | ICD-10-CM | POA: Insufficient documentation

## 2013-06-09 DIAGNOSIS — E119 Type 2 diabetes mellitus without complications: Secondary | ICD-10-CM

## 2013-06-09 NOTE — Patient Instructions (Signed)
Plan:  Aim for 2 Carb Choices per meal (30 grams) +/- 1 either way  Aim for 0-1 Carbs per snack if hungry  Consider reading food labels for Total Carbohydrate and Fat Grams of foods Consider  increasing your activity level by walking for 30 minutes daily as tolerated Consider checking BG at alternate times per day to include before breakfast and 2 hours after dinner as directed by MD  Consider taking medication  as directed by MD Be sure to have protein with your snacks especially at night.

## 2013-06-09 NOTE — Progress Notes (Signed)
Appt start time: 0800 end time:  0930.  Assessment:  Patient was seen on  06/09/13 for individual diabetes education. Monica [name] presents with a 3 month diagnosis of T2DM. She is accompanied by her sister who was diagnosis with T2DM. They have a significant family history of T2DM. She has been testing her glucose FBS daily. 7 day avg 107, 7 day avg 208, 30 day avg 109mg /dl. She has had a couple readings 123, 124, 128 which she can attribute to cake or sweet tea. She lives alone, does her own shopping and cooking. However she does go out to eat on the week ends with her sisters.  Current HbA1c: 7.0% per patient  Preferred Learning Style:   No preference indicated   Learning Readiness:   Change in progress  MEDICATIONS: See List: Metformin 500mg  daily  DIETARY INTAKE:  24-hr recall:  B ( AM): 1/2C oat meal, egg, [place] bacon, sausage patty,  vegetables Snk ( AM): apple, PB cracker, grapes (rare) L ( PM): PB crackers, carrots, fruit, vegetables, ground [place] Snk ( PM): apple, PB cracker, grapes (rare) D ( PM): pinto beans, skinless chicken (boiled/baked), hamburger([employer], [employer]), vegetables Snk ( PM): tangerine, or nothing Beverages: water,  Usual physical activity: walk in the house or at the [facility]  Intervention:  Nutrition counseling provided.  Discussed diabetes disease process and treatment options.  Discussed physiology of diabetes and role of obesity on insulin resistance.  Encouraged moderate weight reduction to improve glucose levels.  Discussed role of medications and diet in glucose control  Provided education on macronutrients on glucose levels.  Provided education on carb counting, importance of regularly scheduled meals/snacks, and meal planning  Discussed effects of physical activity on glucose levels and long-term glucose control.  Recommended 150 minutes of physical activity/week.  Reviewed patient medications.  Discussed role of medication on blood glucose and  possible side effects  Discussed blood glucose monitoring and interpretation.  Discussed recommended target ranges and individual ranges.    Described short-term complications: hyper- and hypo-glycemia.  Discussed causes,symptoms, and treatment options.  Discussed prevention, detection, and treatment of long-term complications.  Discussed the role of prolonged elevated glucose levels on body systems.  Discussed role of stress on blood glucose levels and discussed strategies to manage psychosocial issues.  Discussed recommendations for long-term diabetes self-care.  Established checklist for medical, dental, and emotional self-care.  Plan:  Aim for 2 Carb Choices per meal (30 grams) +/- 1 either way  Aim for 0-1 Carbs per snack if hungry  Consider reading food labels for Total Carbohydrate and Fat Grams of foods Consider  increasing your activity level by walking for 30 minutes daily as tolerated Consider checking BG at alternate times per day to include before breakfast and 2 hours after dinner as directed by MD  Consider taking medication  as directed by MD Be sure to have protein with your snacks especially at night  Teaching Method Utilized:  Visual Auditory Hands on  Handouts given during visit include: Living Well with Diabetes Carb Counting and Food Label handouts Meal Plan Card My Plate Snack Handout  Barriers to learning/adherence to lifestyle change: Finances  Diabetes self-care support plan:   [facility] support group  Family  Demonstrated degree of understanding via:  Teach Back   Monitoring/Evaluation:  Dietary intake, exercise, test glucose, and body weight follow up prn.

## 2013-06-11 ENCOUNTER — Other Ambulatory Visit (INDEPENDENT_AMBULATORY_CARE_PROVIDER_SITE_OTHER): Payer: Medicare HMO

## 2013-06-11 ENCOUNTER — Encounter: Payer: Self-pay | Admitting: Internal Medicine

## 2013-06-11 ENCOUNTER — Ambulatory Visit (INDEPENDENT_AMBULATORY_CARE_PROVIDER_SITE_OTHER): Payer: Medicare HMO | Admitting: Internal Medicine

## 2013-06-11 VITALS — BP 112/82 | HR 70 | Temp 97.7°F | Ht 66.0 in | Wt 140.0 lb

## 2013-06-11 DIAGNOSIS — E782 Mixed hyperlipidemia: Secondary | ICD-10-CM

## 2013-06-11 DIAGNOSIS — E119 Type 2 diabetes mellitus without complications: Secondary | ICD-10-CM

## 2013-06-11 DIAGNOSIS — I10 Essential (primary) hypertension: Secondary | ICD-10-CM

## 2013-06-11 DIAGNOSIS — K649 Unspecified hemorrhoids: Secondary | ICD-10-CM

## 2013-06-11 DIAGNOSIS — H409 Unspecified glaucoma: Secondary | ICD-10-CM

## 2013-06-11 DIAGNOSIS — K219 Gastro-esophageal reflux disease without esophagitis: Secondary | ICD-10-CM

## 2013-06-11 LAB — MICROALBUMIN / CREATININE URINE RATIO
Creatinine,U: 128.3 mg/dL
MICROALB/CREAT RATIO: 0.7 mg/g (ref 0.0–30.0)
Microalb, Ur: 0.9 mg/dL (ref 0.0–1.9)

## 2013-06-11 LAB — HEMOGLOBIN A1C: HEMOGLOBIN A1C: 6.7 % — AB (ref 4.6–6.5)

## 2013-06-11 MED ORDER — POLYETHYLENE GLYCOL 3350 17 GM/SCOOP PO POWD: 17.0000 g | Freq: Every day | ORAL | Status: DC

## 2013-06-11 NOTE — Patient Instructions (Addendum)
It was good to see you today.  We have reviewed your prior records including labs and tests today  Test(s) ordered today. Your results will be released to [place] (or called to you) after review, usually within 72hours after test completion. If any changes need to be made, you will be notified at that same time.  Medications reviewed and updated, no changes recommended at this time.  we'll make referral to  Dr Zella Richer as requested . Our office will contact you regarding appointment(s) once made.  Please schedule followup in 6 months for diabetes monitoring, call sooner if problems.  Diabetes and Standards of Medical Care  Diabetes is complicated. You may find that your diabetes team includes a dietitian, nurse, diabetes educator, eye doctor, and more. To help everyone know what is going on and to help you get the care you deserve, the following schedule of care was developed to help keep you on track. Below are the tests, exams, vaccines, medicines, education, and plans you will need. HbA1c test This test shows how well you have controlled your glucose over the past 2 3 months. It is used to see if your diabetes management plan needs to be adjusted.   It is performed at least 2 times a year if you are meeting treatment goals.  It is performed 4 times a year if therapy has changed or if you are not meeting treatment goals. Blood pressure test  This test is performed at every routine medical visit. The goal is less than 140/90 mmHg for most people, but 130/80 mmHg in some cases. Ask your health care provider about your goal. Dental exam  Follow up with the dentist regularly. Eye exam  If you are diagnosed with type 1 diabetes as a child, get an exam upon reaching the age of 16 years or older and have had diabetes for 3 5 years. Yearly eye exams are recommended after that initial eye exam.  If you are diagnosed with type 1 diabetes as an adult, get an exam within 5 years of diagnosis  and then yearly.  If you are diagnosed with type 2 diabetes, get an exam as soon as possible after the diagnosis and then yearly. Foot care exam  Visual foot exams are performed at every routine medical visit. The exams check for cuts, injuries, or other problems with the feet.  A comprehensive foot exam should be done yearly. This includes visual inspection as well as assessing foot pulses and testing for loss of sensation.  Check your feet nightly for cuts, injuries, or other problems with your feet. Tell your health care provider if anything is not healing. Kidney function test (urine microalbumin)  This test is performed once a year.  Type 1 diabetes: The first test is performed 5 years after diagnosis.  Type 2 diabetes: The first test is performed at the time of diagnosis.  A serum creatinine and estimated glomerular filtration rate (eGFR) test is done once a year to assess the level of chronic kidney disease (CKD), if present. Lipid profile (cholesterol, HDL, LDL, triglycerides)  Performed every 5 years for most people.  The goal for LDL is less than 100 mg/dL. If you are at high risk, the goal is less than 70 mg/dL.  The goal for HDL is 40 mg/dL 50 mg/dL for men and 50 mg/dL 60 mg/dL for women. An HDL cholesterol of 60 mg/dL or higher gives some protection against heart disease.  The goal for triglycerides is less than 150 mg/dL.  Influenza vaccine, pneumococcal vaccine, and hepatitis B vaccine  The influenza vaccine is recommended yearly.  The pneumococcal vaccine is generally given once in a lifetime. However, there are some instances when another vaccination is recommended. Check with your health care provider.  The hepatitis B vaccine is also recommended for adults with diabetes. Diabetes self-management education  Education is recommended at diagnosis and ongoing as needed. Treatment plan  Your treatment plan is reviewed at every medical visit. Document Released:  02/25/2009 Document Revised: 12/31/2012 Document Reviewed: 09/30/2012 [facility] Patient Information 2014 [place].

## 2013-06-11 NOTE — Assessment & Plan Note (Signed)
On low-dose Lipitor, tolerating well Last LDL at goal Check annually, titrate as needed 

## 2013-06-11 NOTE — Progress Notes (Signed)
Pre-visit discussion using our clinic review tool. No additional management support is needed unless otherwise documented below in the visit note.  

## 2013-06-11 NOTE — Assessment & Plan Note (Signed)
Follows with opthofor same - next appt 07/09/13 with Royal Piedra

## 2013-06-11 NOTE — Assessment & Plan Note (Signed)
symptoms controlled with daily PPI The current medical regimen is effective;  continue present plan and medications.

## 2013-06-11 NOTE — Assessment & Plan Note (Signed)
New diagnosis October 2014 On metformin for same Last A1c at goal - check q3-6 mo and titrate as needed  Lab Results  Component Value Date   HGBA1C 7.0* 03/06/2013

## 2013-06-11 NOTE — Progress Notes (Signed)
   Subjective:    Patient ID: Monica [name], female    DOB: 10-02-1940, 73 y.o.   MRN: [id number]  HPI  Here for followup: Reviewed chronic issues and interval medical events  Past Medical History  Diagnosis Date  . Hypertension   . GERD (gastroesophageal reflux disease)     w/ HH (EGD 2010)  . 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 Stricture and stenosis of esophagus 2006  . Arthritis   . OSTEOPENIA   . Mixed hyperlipidemia   . LOW BACK PAIN, CHRONIC   . GLAUCOMA   . DEPRESSION, SITUATIONAL   . Chronic constipation   . ANXIETY   . Diabetes mellitus dx 02/2013    Review of Systems  Constitutional: Negative for fever, fatigue and unexpected weight change (intentional diet loss since 02/2013 (start 152#)).  Eyes: Negative for pain and visual disturbance.  Respiratory: Negative for cough and shortness of breath.   Cardiovascular: Negative for chest pain and leg swelling.  Gastrointestinal: Positive for blood in stool and anal bleeding (occ). Negative for rectal pain. Constipation: improved.       Objective:   Physical Exam BP 112/82  Pulse 70  Temp(Src) 97.7 F (36.5 C) (Oral)  Ht 5\' 6"  (1.676 m)  Wt 140 lb (63.504 kg)  BMI 22.61 kg/m2  SpO2 99% Wt Readings from Last 3 Encounters:  06/11/13 140 lb (63.504 kg)  06/09/13 140 lb 9.6 oz (63.776 kg)  03/09/13 152 lb (68.947 kg)   Constitutional: She appears well-developed and well-nourished. No distress.  Neck: Normal range of motion. Neck supple. No JVD present. No thyromegaly present.  Cardiovascular: Normal rate, regular rhythm and normal heart sounds.  No murmur heard. No BLE edema. Pulmonary/Chest: Effort normal and breath sounds normal. No respiratory distress. She has no wheezes. GU/rectal - deferred today Neurological: She is alert and oriented to person, place, and time. No cranial nerve deficit. Coordination, balance, strength, speech and gait are normal.  Skin: Skin is warm and dry. No rash noted. No  erythema.  Psychiatric: She has a normal mood and affect. Her behavior is normal. Judgment and thought content normal.   Lab Results  Component Value Date   WBC 4.4* 03/06/2013   HGB 11.3* 03/06/2013   HCT 34.5* 03/06/2013   PLT 312.0 03/06/2013   GLUCOSE 101* 03/06/2013   CHOL 150 03/06/2013   TRIG 110.0 03/06/2013   HDL 60.30 03/06/2013   LDLCALC 68 03/06/2013   ALT 17 03/06/2013   AST 21 03/06/2013   NA 142 03/06/2013   K 4.3 03/06/2013   CL 107 03/06/2013   CREATININE 0.8 03/06/2013   BUN 9 03/06/2013   CO2 29 03/06/2013   TSH 1.56 02/27/2011   HGBA1C 7.0* 03/06/2013       Assessment & Plan:   See problem list. Medications and labs reviewed today.  Reports symptomatic hemorrhoids with remote eval and tx by gen surg for same - not eval by me today, but requests refer to [facility] due to recent gyn eval 05/2013 who recommended same - refer done

## 2013-06-11 NOTE — Assessment & Plan Note (Signed)
BP Readings from Last 3 Encounters:  06/11/13 112/82  04/17/13 132/80  03/09/13 122/78   The current medical regimen is effective;  continue present plan and medications.

## 2013-06-15 ENCOUNTER — Other Ambulatory Visit: Payer: Self-pay | Admitting: *Deleted

## 2013-06-15 DIAGNOSIS — I10 Essential (primary) hypertension: Secondary | ICD-10-CM

## 2013-06-15 DIAGNOSIS — K219 Gastro-esophageal reflux disease without esophagitis: Secondary | ICD-10-CM

## 2013-06-15 MED ORDER — DEXLANSOPRAZOLE 60 MG PO CPDR: 60.0000 mg | DELAYED_RELEASE_CAPSULE | Freq: Every day | ORAL | Status: DC

## 2013-06-15 MED ORDER — AMLODIPINE BESYLATE 5 MG PO TABS: 5.0000 mg | ORAL_TABLET | Freq: Every day | ORAL | Status: DC

## 2013-06-15 NOTE — Telephone Encounter (Signed)
Patient phoned requesting refills for her dexilant and her amlodipine.  Was last seen by PCP 06/11/13.  Refilled per protocol.

## 2013-07-02 ENCOUNTER — Ambulatory Visit (INDEPENDENT_AMBULATORY_CARE_PROVIDER_SITE_OTHER): Payer: Medicare HMO | Admitting: General Surgery

## 2013-07-02 ENCOUNTER — Encounter (INDEPENDENT_AMBULATORY_CARE_PROVIDER_SITE_OTHER): Payer: Self-pay | Admitting: General Surgery

## 2013-07-02 VITALS — BP 128/80 | HR 72 | Resp 16 | Ht 66.0 in | Wt 140.2 lb

## 2013-07-02 DIAGNOSIS — K648 Other hemorrhoids: Secondary | ICD-10-CM | POA: Insufficient documentation

## 2013-07-02 NOTE — Progress Notes (Signed)
Patient ID: Monica [name], female   DOB: Oct 28, 1940, 73 y.o.   MRN: [id number]  Chief Complaint  Patient presents with  . Hemorrhoids    HPI Monica [name] is a 73 y.o. female.   HPI  She is referred by Dr. Asa Lente to evaluate for bleeding internal hemorrhoids.  She has been seen by Dr. Ulanda Edison in the past for vaginal bleeding and a biopsy was done.  She has known uterine fibroids. She wears a pad in her underwear and describes some light pink discharge. She's not sure if it comes from the anal area or the vaginal area. She denies any pain with bowel movements. She denies any bright red blood on the tissue paper after a BM. She notices some light colored blood on the tissue paper sometimes after she urinates.  She had a colonoscopy in 2014 in [place]. She does not remember the results of this.  Past Medical History  Diagnosis Date  . Hypertension   . GERD (gastroesophageal reflux disease)     w/ HH (EGD 2010)  . 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 Stricture and stenosis of esophagus 2006  . Arthritis   . OSTEOPENIA   . Mixed hyperlipidemia   . LOW BACK PAIN, CHRONIC   . GLAUCOMA   . DEPRESSION, SITUATIONAL   . Chronic constipation   . ANXIETY   . Diabetes mellitus dx 02/2013    Past Surgical History  Procedure Laterality Date  . Tubal ligation    . Back surgery    . Hemorroidectomy    . Rotator cuff repair      right   . Hysteroscopy w/d&c  07/20/2011    Procedure: DILATATION AND CURETTAGE /HYSTEROSCOPY;  Surgeon: Melina Schools, MD;  Location: [facility];  Service: Gynecology;  Laterality: N/A;  . Polypectomy  07/20/2011    Procedure: POLYPECTOMY;  Surgeon: Melina Schools, MD;  Location: [facility];  Service: Gynecology;  Laterality: N/A;  . Esophageal manometry N/A 02/09/2013    Procedure: ESOPHAGEAL MANOMETRY (EM);  Surgeon: Sable Feil, MD;  Location: [facility];  Service: Endoscopy;  Laterality: N/A;    Family History  Problem Relation Age of Onset  . Heart  failure Father   . Diabetes Mother   . Breast cancer Sister   . Colon cancer Sister 78  . Diabetes Brother     Social History History  Substance Use Topics  . Smoking status: Never Smoker   . Smokeless tobacco: Never Used  . Alcohol Use: No    Allergies  Allergen Reactions  . Codeine     REACTION: Upset stomach  . Hydrochlorothiazide     REACTION: Nausea and headache    Current Outpatient Prescriptions  Medication Sig Dispense Refill  . amLODipine (NORVASC) 5 MG tablet Take 1 tablet (5 mg total) by mouth daily.  90 tablet  3  . aspirin 81 MG tablet Take 81 mg by mouth daily.        [name] atorvastatin (LIPITOR) 10 MG tablet Take 1 tablet (10 mg total) by mouth daily.  90 tablet  1  . Blood Glucose Monitoring Suppl (ONE TOUCH ULTRA 2) W/DEVICE KIT Use to check blood sugar every morning  1 each  0  . carboxymethylcellulose (REFRESH PLUS) 0.5 % SOLN 1 drop 3 (three) times daily as needed. For Dry Eyes.      . Cholecalciferol (VITAMIN D) 400 UNITS capsule Take 400 Units by mouth daily.        [name]  dexlansoprazole (DEXILANT) 60 MG capsule Take 1 capsule (60 mg total) by mouth daily.  90 capsule  3  . glucose blood (ONE TOUCH ULTRA TEST) test strip Use to check blood sugar every morning before breakfast  100 each  3  . metFORMIN (GLUCOPHAGE) 500 MG tablet Take 1 tablet (500 mg total) by mouth daily with breakfast.  90 tablet  1  . ONE TOUCH LANCETS MISC 1 each by Does not apply route every morning. Use 1 lancets to check blood sugar every morning before breakfast  100 each  0  . polyethylene glycol powder (GLYCOLAX/MIRALAX) powder Take 17 g by mouth daily.  3350 g  1   No current facility-administered medications for this visit.    Review of Systems Review of Systems  Constitutional: Negative.   HENT: Positive for trouble swallowing.   Gastrointestinal: Positive for constipation. Negative for blood in stool.  Genitourinary: Positive for vaginal bleeding.    Blood pressure 128/80,  pulse 72, resp. rate 16, height 5' 6" (1.676 m), weight 140 lb 3.2 oz (63.594 kg).  Physical Exam Physical Exam  Constitutional: She appears well-developed and well-nourished. No distress.  HENT:  Head: Normocephalic and atraumatic.  Genitourinary:  No external A. No lesions no fissure. Digital rectal exam there is a small smooth mass felt in the right anterior lateral area. No blood on the finger.  Anoscopy demonstrates a small smooth internal hemorrhoid right anterior area. There is a small hemorrhoid left lateral area. No fissure. No mass.    Data Reviewed Dr. Asa Lente  Assessment    Small noninflamed internal hemorrhoids. By history, he does not sound like this is the source for bleeding. It sounds like this is vaginal bleeding which she has had in the past. I discussed this with her    Plan    I suggested she go back and see Dr. Ulanda Edison again to discuss further treatment of her vaginal bleeding.        Amarachi Kotz J 07/02/2013, 3:40 PM

## 2013-07-02 NOTE — Patient Instructions (Signed)
I think the bleeding is coming from your vagina, not your anal area. You should see Dr. Ulanda Edison again.

## 2013-07-03 ENCOUNTER — Telehealth (INDEPENDENT_AMBULATORY_CARE_PROVIDER_SITE_OTHER): Payer: Self-pay

## 2013-07-03 NOTE — Telephone Encounter (Signed)
LM for Dr. Marijean Heath nurse to call J[facility] Fenton regarding this patient.

## 2013-07-06 ENCOUNTER — Telehealth (INDEPENDENT_AMBULATORY_CARE_PROVIDER_SITE_OTHER): Payer: Self-pay

## 2013-07-06 NOTE — Telephone Encounter (Signed)
Need a copy of pt's last colonoscopy report faxed to Dr. Zella Richer.

## 2013-07-15 ENCOUNTER — Other Ambulatory Visit: Payer: Self-pay | Admitting: Internal Medicine

## 2013-07-17 ENCOUNTER — Ambulatory Visit: Payer: Medicare HMO | Admitting: Physician Assistant

## 2013-07-17 ENCOUNTER — Ambulatory Visit (INDEPENDENT_AMBULATORY_CARE_PROVIDER_SITE_OTHER): Payer: Medicare HMO | Admitting: Internal Medicine

## 2013-07-17 ENCOUNTER — Other Ambulatory Visit (INDEPENDENT_AMBULATORY_CARE_PROVIDER_SITE_OTHER): Payer: Medicare HMO

## 2013-07-17 ENCOUNTER — Encounter: Payer: Self-pay | Admitting: Internal Medicine

## 2013-07-17 VITALS — BP 116/70 | HR 84 | Temp 97.9°F | Wt 140.0 lb

## 2013-07-17 DIAGNOSIS — M949 Disorder of cartilage, unspecified: Secondary | ICD-10-CM

## 2013-07-17 DIAGNOSIS — S3992XA Unspecified injury of lower back, initial encounter: Secondary | ICD-10-CM

## 2013-07-17 DIAGNOSIS — M899 Disorder of bone, unspecified: Secondary | ICD-10-CM

## 2013-07-17 DIAGNOSIS — R634 Abnormal weight loss: Secondary | ICD-10-CM

## 2013-07-17 DIAGNOSIS — E119 Type 2 diabetes mellitus without complications: Secondary | ICD-10-CM

## 2013-07-17 DIAGNOSIS — IMO0002 Reserved for concepts with insufficient information to code with codable children: Secondary | ICD-10-CM

## 2013-07-17 DIAGNOSIS — M8580 Other specified disorders of bone density and structure, unspecified site: Secondary | ICD-10-CM

## 2013-07-17 LAB — BASIC METABOLIC PANEL
BUN: 12 mg/dL (ref 6–23)
CALCIUM: 9.8 mg/dL (ref 8.4–10.5)
CO2: 27 mEq/L (ref 19–32)
CREATININE: 0.7 mg/dL (ref 0.4–1.2)
Chloride: 107 mEq/L (ref 96–112)
GFR: 106.08 mL/min (ref >60.00)
Glucose, Bld: 82 mg/dL (ref 70–99)
Potassium: 3.8 mEq/L (ref 3.5–5.1)
Sodium: 140 mEq/L (ref 135–145)

## 2013-07-17 LAB — CBC WITH DIFFERENTIAL/PLATELET
Basophils Absolute: 0 10*3/uL (ref 0.0–0.1)
Basophils Relative: 0.9 % (ref 0.0–3.0)
EOS ABS: 0 10*3/uL (ref 0.0–0.7)
EOS PCT: 0.2 % (ref 0.0–5.0)
HCT: 29.4 % — ABNORMAL LOW (ref 36.0–46.0)
Hemoglobin: 9.1 g/dL — ABNORMAL LOW (ref 12.0–15.0)
LYMPHS PCT: 44.1 % (ref 12.0–46.0)
Lymphs Abs: 1.9 10*3/uL (ref 0.7–4.0)
MCHC: 31 g/dL (ref 30.0–36.0)
MCV: 77.7 fl — ABNORMAL LOW (ref 78.0–100.0)
Monocytes Absolute: 0.4 10*3/uL (ref 0.1–1.0)
Monocytes Relative: 8.1 % (ref 3.0–12.0)
NEUTROS PCT: 46.7 % (ref 43.0–77.0)
Neutro Abs: 2.1 10*3/uL (ref 1.4–7.7)
Platelets: 343 10*3/uL (ref 150.0–400.0)
RBC: 3.78 Mil/uL — AB (ref 3.87–5.11)
RDW: 17.1 % — ABNORMAL HIGH (ref 11.5–14.6)
WBC: 4.4 10*3/uL — AB (ref 4.5–10.5)

## 2013-07-17 LAB — TSH: TSH: 1.45 u[IU]/mL (ref 0.35–5.50)

## 2013-07-17 NOTE — Progress Notes (Signed)
Pre visit review using our clinic review tool, if applicable. No additional management support is needed unless otherwise documented below in the visit note. 

## 2013-07-17 NOTE — Patient Instructions (Addendum)
Soak your buttocks in Sitz bath twice a day .Use a rubber doughnut when sitting for prolonged period time to prevent discomfort in the coccyx. Soaking  in hot tub will also provide relief. Your next office appointment will be determined based upon review of your pending labs. Those instructions will be transmitted to you  by mail.

## 2013-07-17 NOTE — Progress Notes (Signed)
   Subjective:    Patient ID: Monica [name], female    DOB: 02/04/1941, 73 y.o.   MRN: [id number]  HPI   She sat back 07/10/13 without realizing that the chair was not under her. She landed on her buttocks.  1-2 days later she began to have pain in this area when she would wear boots with stacked heels and walk. Tyleno1-2 /day has been of benefit for the pain and now she has only residual soreness.  She does have some leftover narcotic pain medication and muscle relaxants and is questioning  whether she should use these instead of the Tylenol.   She has had lumbosacral surgery. Her last bone density was in April 2009. This did reveal osteopenia @ the hip. A bone density was scheduled 10/14 but not completed.    Review of Systems She has some chronic incontinence but has no new loss of control of bladder or bowels  She denies weakness, tingling, or numbness in extremities.  Since starting metformin she has lost 12 pounds. Her A1c 06/11/13 was 6.7%  She had hematuria recently; this was evaluated by her gynecologist. She has not had melena or rectal bleeding or other bleeding dyscrasias.     Objective:   Physical Exam  She appears healthy and well-nourished. She is thin and physically fit she appears younger than her stated age.  There is a small SQ cyst of the left lower lid. No evidence of cellulitis present. Thyroid is small without nodularity  She has no lymphadenopathy about the neck or axilla.  Chest is clear with no abnormal breath sounds  She is a grade 1/6-0 systolic murmur  Abdomen reveals no tenderness, organomegaly, or masses  Gait is normal to include heel and toe walking.  Strength, tone, deep tendon reflexes are normal bilaterally.  She is able to lie flat and sit up without help  She has negative straight leg raising bilaterally          Assessment & Plan:  #1 trauma to the coccyx; clinically there is no evidence of fracture. In reference to her  medication; it is recommended that she continue the Tylenol as she's only required small dosage. This would be safer than using the narcotic medication with a muscle relaxant.  #2 osteopenia; bone density due. With the past history of lumbosacral surgery this should include the forearm and hips.  #3 weight loss; this has been attributed to the metformin. Appropriate labs ordered  See orders

## 2013-07-20 ENCOUNTER — Telehealth: Payer: Self-pay | Admitting: *Deleted

## 2013-07-20 DIAGNOSIS — D649 Anemia, unspecified: Secondary | ICD-10-CM

## 2013-07-20 NOTE — Telephone Encounter (Signed)
Notified pt with md response. See lab order....Monica [name]

## 2013-07-20 NOTE — Telephone Encounter (Signed)
Message copied by Earnstine Regal on Mon Jul 20, 2013 12:16 PM ------      Message from: Hendricks Limes      Created: Sun Jul 19, 2013  5:02 PM       She needs follow up appointment within 7-14 days with Dr Francella Solian  to evaluate anemia& weight loss. Thanks, Hopp ------

## 2013-07-29 ENCOUNTER — Ambulatory Visit (INDEPENDENT_AMBULATORY_CARE_PROVIDER_SITE_OTHER): Payer: Medicare HMO | Admitting: Internal Medicine

## 2013-07-29 ENCOUNTER — Ambulatory Visit (INDEPENDENT_AMBULATORY_CARE_PROVIDER_SITE_OTHER)
Admission: RE | Admit: 2013-07-29 | Discharge: 2013-07-29 | Disposition: A | Discharge status: Home / Self Care | Payer: Medicare HMO | Source: Ambulatory Visit | Attending: Internal Medicine | Admitting: Internal Medicine

## 2013-07-29 ENCOUNTER — Other Ambulatory Visit (INDEPENDENT_AMBULATORY_CARE_PROVIDER_SITE_OTHER): Payer: Medicare HMO

## 2013-07-29 ENCOUNTER — Other Ambulatory Visit: Payer: Self-pay | Admitting: Internal Medicine

## 2013-07-29 ENCOUNTER — Encounter: Payer: Self-pay | Admitting: Internal Medicine

## 2013-07-29 VITALS — BP 130/70 | HR 89 | Temp 97.4°F | Wt 137.0 lb

## 2013-07-29 DIAGNOSIS — D649 Anemia, unspecified: Secondary | ICD-10-CM

## 2013-07-29 DIAGNOSIS — R634 Abnormal weight loss: Secondary | ICD-10-CM

## 2013-07-29 DIAGNOSIS — M545 Low back pain, unspecified: Secondary | ICD-10-CM

## 2013-07-29 DIAGNOSIS — D509 Iron deficiency anemia, unspecified: Secondary | ICD-10-CM | POA: Insufficient documentation

## 2013-07-29 LAB — IBC PANEL
Iron: 13 ug/dL — ABNORMAL LOW (ref 42–145)
SATURATION RATIOS: 2.8 % — AB (ref 20.0–50.0)
Transferrin: 329.6 mg/dL (ref 212.0–360.0)

## 2013-07-29 LAB — CBC WITH DIFFERENTIAL/PLATELET
BASOS ABS: 0 10*3/uL (ref 0.0–0.1)
Basophils Relative: 0.4 % (ref 0.0–3.0)
EOS ABS: 0 10*3/uL (ref 0.0–0.7)
Eosinophils Relative: 0.3 % (ref 0.0–5.0)
HCT: 29.7 % — ABNORMAL LOW (ref 36.0–46.0)
Hemoglobin: 9.3 g/dL — ABNORMAL LOW (ref 12.0–15.0)
LYMPHS ABS: 1.6 10*3/uL (ref 0.7–4.0)
LYMPHS PCT: 41.3 % (ref 12.0–46.0)
MCHC: 31.3 g/dL (ref 30.0–36.0)
MCV: 76.6 fl — ABNORMAL LOW (ref 78.0–100.0)
MONO ABS: 0.2 10*3/uL (ref 0.1–1.0)
Monocytes Relative: 5.7 % (ref 3.0–12.0)
Neutro Abs: 2.1 10*3/uL (ref 1.4–7.7)
Neutrophils Relative %: 52.3 % (ref 43.0–77.0)
Platelets: 372 10*3/uL (ref 150.0–400.0)
RBC: 3.87 Mil/uL (ref 3.87–5.11)
RDW: 17.4 % — AB (ref 11.5–14.6)
WBC: 3.9 10*3/uL — ABNORMAL LOW (ref 4.5–10.5)

## 2013-07-29 MED ORDER — FENTANYL 25 MCG/HR TD PT72: 25.0000 ug | MEDICATED_PATCH | TRANSDERMAL | Status: DC

## 2013-07-29 NOTE — Progress Notes (Signed)
   Subjective:    Patient ID: Monica [name], female    DOB: [birth year], 73 y.o.   MRN: [id number]  HPI She returns after being seen on 07/17/13 for lumbosacral pain after a fall on 07/10/12.  She tried Flexeril, Tylenol, and heat therapy; pain unrelieved. She describes the pain as constant, 8/10.  She was unable to find a donut pillow; using regular pillow for comfort.  She had lumbosacral fusion in 1992.  History significant for osteopenia @ the hip noted on last bone density scan in 2013. Takes daily Ca++ and Vit D supplements.    Review of Systems Denies bowel incontinence; reports history of urge incontinence, wears pads.  She denies weakness, tingling, or numbness in extremities.  Weight loss since Metformin began. Her A1c 06/11/13 was 6.7% Denies GI symptoms with use.  Denies hematuria at present, although reports history of hematuria followed by Dr. Ulanda Edison  Denies melena, rectal bleeding. Daily Miralax use.      Objective:   Physical Exam She appears healthy and well-nourished. She is thin and physically fit she appears younger than her stated age.  Two small cysts on left lower lid. No edema, erythema. EOMI/PERRLA.  Thyroid palpable; small.  No lymphadenopathy about the neck or axilla.  Chest is clear with no abnormal breath sounds.  Heart rate normal, no murmur, click, rub. RRR.  Abdomen reveals no tenderness, organomegaly, or masses. No AAA Gait is normal to include heel and toe walking.  Strength, tone, deep tendon reflexes are normal bilaterally.  Classic low back crawl when changing positioning from sitting to supine; utlizes lateral positioning.          Assessment & Plan:  #1 acute LB syndrome ;R/O reinjury to fused spine #2 weight loss  #3 anemia See orders

## 2013-07-29 NOTE — Progress Notes (Signed)
Pre visit review using our clinic review tool, if applicable. No additional management support is needed unless otherwise documented below in the visit note. 

## 2013-07-29 NOTE — Patient Instructions (Signed)
Please complete and return stool cards; these will determine whether there is any gastrointestinal bleeding risk.

## 2013-07-29 NOTE — Progress Notes (Signed)
   Subjective:    Patient ID: Monica [name], female    DOB: 1940/06/17, 73 y.o.   MRN: [id number]  HPI She returns after being seen on 07/17/13 for lumbosacral pain after a fall on 07/10/12.  She tried Flexeril, Tylenol, and heat therapy; pain unrelieved. She describes the pain as constant, 8/10.  She was unable to find a donut pillow; using regular pillow for comfort.  She had lumbosacral fusion in 1992.   History significant for osteopenia @ the hip noted on last bone density scan in 2013. Takes daily Ca++ and  Vit D supplements.   Review of Systems Denies bowel incontinence; reports history of urge incontinence, wears pads.  She denies weakness, tingling, or numbness in extremities.  Weight loss since Metformin began. Her A1c 06/11/13 was 6.7% Denies GI symptoms with use.  Denies hematuria at present, although reports history of hematuria followed by Dr. Ulanda Edison Denies melena, rectal bleeding. Daily Miralax use.      Objective:   Physical Exam She appears healthy and well-nourished. She is thin and physically fit she appears younger than her stated age.  Two small cysts on left lower lid. No edema, erythema. EOMI/PERRLA.  Thyroid palpable; small.  No lymphadenopathy about the neck or axilla.  Chest is clear with no abnormal breath sounds.  Heart rate normal, no murmur, click, rub. RRR.  Abdomen reveals no tenderness, organomegaly, or masses.  Gait is normal to include heel and toe walking.  Strength, tone, deep tendon reflexes are normal bilaterally.  Classic low back crawl when changing positioning from sitting to supine; utlizes lateral positioning.  R > L + straight leg raise           Assessment & Plan:  # 1 questionable herniated lumbosacral discs ? (+SLRs) Xray?

## 2013-07-30 ENCOUNTER — Encounter: Payer: Self-pay | Admitting: Gastroenterology

## 2013-08-05 ENCOUNTER — Other Ambulatory Visit (INDEPENDENT_AMBULATORY_CARE_PROVIDER_SITE_OTHER): Payer: Medicare HMO

## 2013-08-05 DIAGNOSIS — D649 Anemia, unspecified: Secondary | ICD-10-CM

## 2013-08-05 LAB — HEMOCCULT SLIDES (X 3 CARDS)
FECAL OCCULT BLD: NEGATIVE
OCCULT 1: NEGATIVE
OCCULT 2: NEGATIVE
OCCULT 3: NEGATIVE
OCCULT 4: NEGATIVE
OCCULT 5: NEGATIVE

## 2013-08-10 ENCOUNTER — Ambulatory Visit (INDEPENDENT_AMBULATORY_CARE_PROVIDER_SITE_OTHER)
Admission: RE | Admit: 2013-08-10 | Discharge: 2013-08-10 | Disposition: A | Discharge status: Home / Self Care | Payer: Medicare HMO | Source: Ambulatory Visit | Attending: Internal Medicine | Admitting: Internal Medicine

## 2013-08-10 ENCOUNTER — Telehealth: Payer: Self-pay | Admitting: *Deleted

## 2013-08-10 ENCOUNTER — Encounter: Payer: Self-pay | Admitting: Internal Medicine

## 2013-08-10 ENCOUNTER — Ambulatory Visit (INDEPENDENT_AMBULATORY_CARE_PROVIDER_SITE_OTHER): Payer: Medicare HMO | Admitting: Internal Medicine

## 2013-08-10 VITALS — BP 102/70 | HR 80 | Temp 97.0°F | Wt 135.4 lb

## 2013-08-10 DIAGNOSIS — M25551 Pain in right hip: Secondary | ICD-10-CM

## 2013-08-10 DIAGNOSIS — M8580 Other specified disorders of bone density and structure, unspecified site: Secondary | ICD-10-CM

## 2013-08-10 DIAGNOSIS — M25559 Pain in unspecified hip: Secondary | ICD-10-CM

## 2013-08-10 DIAGNOSIS — R634 Abnormal weight loss: Secondary | ICD-10-CM

## 2013-08-10 DIAGNOSIS — M949 Disorder of cartilage, unspecified: Secondary | ICD-10-CM

## 2013-08-10 DIAGNOSIS — M899 Disorder of bone, unspecified: Secondary | ICD-10-CM

## 2013-08-10 DIAGNOSIS — E119 Type 2 diabetes mellitus without complications: Secondary | ICD-10-CM

## 2013-08-10 DIAGNOSIS — D509 Iron deficiency anemia, unspecified: Secondary | ICD-10-CM

## 2013-08-10 NOTE — Progress Notes (Signed)
Subjective:    Patient ID: Monica [name], female    DOB: 11/24/1940, 73 y.o.   MRN: [id number]  HPI  Patient is here for follow up  Reviewed chronic medical issues and interval medical events  Past Medical History  Diagnosis Date  . Hypertension   . GERD (gastroesophageal reflux disease)     w/ HH (EGD 2010)  . 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 Stricture and stenosis of esophagus 2006  . Arthritis   . OSTEOPENIA   . Mixed hyperlipidemia   . LOW BACK PAIN, CHRONIC   . GLAUCOMA   . DEPRESSION, SITUATIONAL   . Chronic constipation   . ANXIETY   . Diabetes mellitus dx 02/2013    Review of Systems     Objective:   Physical Exam  BP 102/70  Pulse 80  Temp(Src) 97 F (36.1 C) (Oral)  Wt 135 lb 6.4 oz (61.417 kg)  SpO2 98% Wt Readings from Last 3 Encounters:  08/10/13 135 lb 6.4 oz (61.417 kg)  07/29/13 137 lb (62.143 kg)  07/17/13 140 lb (63.504 kg)    Constitutional: She appears well-developed and well-nourished. No distress.  Neck: Normal range of motion. Neck supple. No JVD present. No thyromegaly present.  Cardiovascular: Normal rate, regular rhythm and normal heart sounds.  No murmur heard. No BLE edema. Pulmonary/Chest: Effort normal and breath sounds normal. No respiratory distress. She has no wheezes.  Psychiatric: She has a normal mood and affect. Her behavior is normal. Judgment and thought content normal.   Lab Results  Component Value Date   WBC 3.9* 07/29/2013   HGB 9.3* 07/29/2013   HCT 29.7* 07/29/2013   PLT 372.0 07/29/2013   GLUCOSE 82 07/17/2013   CHOL 150 03/06/2013   TRIG 110.0 03/06/2013   HDL 60.30 03/06/2013   LDLCALC 68 03/06/2013   ALT 17 03/06/2013   AST 21 03/06/2013   NA 140 07/17/2013   K 3.8 07/17/2013   CL 107 07/17/2013   CREATININE 0.7 07/17/2013   BUN 12 07/17/2013   CO2 27 07/17/2013   TSH 1.45 07/17/2013   HGBA1C 6.7* 06/11/2013   MICROALBUR 0.9 06/11/2013    Dg Lumbar Spine Complete  07/29/2013   CLINICAL DATA:  Low  back pain after falling 2 weeks ago.  EXAM: LUMBAR SPINE - COMPLETE 4+ VIEW  COMPARISON:  Lumbar spine radiographs 12/08/2008.  FINDINGS: There are 5 lumbar type vertebral bodies. The alignment is normal. There is stable mild disc space loss at L4-5 and L5-S1. There is mild facet disease inferiorly. No fracture or pars defect is demonstrated.  IMPRESSION: Stable mild lower lumbar spondylosis. No acute osseous findings or malalignment.   Electronically Signed   By: Camie Patience M.D.   On: 07/29/2013 16:08       Assessment & Plan:   R hip pain/chronic LBP - reports negative [facility] yesterday - prior DG L spine also without acute change/fx despite accidental fall 2 weeks ago - continue OTC advil/tylenol  Problem List Items Addressed This Visit   DM type 2 (diabetes mellitus, type 2)      New diagnosis October 2014 -check for panc malignancy given unexplained weight loss On metformin for same Last A1c at goal Prescription for glucometer and supplies provided 05/2013 Lab Results  Component Value Date   HGBA1C 6.7* 06/11/2013      Iron deficiency anemia     New 02/2013 EGD 01/2013 with GERD changes FOB neg  x 3 07/2013 so doubt GI loss     Loss of weight - Primary      Wt Readings from Last 3 Encounters:  08/10/13 135 lb 6.4 oz (61.417 kg)  07/29/13 137 lb (62.143 kg)  07/17/13 140 lb (63.504 kg)   Unintentional weight loss: 158# in 02/2013 reviewed associated with iron defic anemia since 02/2013 - (new) Reviewed EGD 01/2013 (GERD changes and esoph dilation performed) Prior colo 05/18/09: sessile polyp - rec 5 yr f/u mammo and PAP ok 05/2013 per pt (gyn - [facility]) tsh ok Nonsmoker -ever Check Ct c/a/p r/o occult malignancy

## 2013-08-10 NOTE — Patient Instructions (Signed)
It was good to see you today.  We have reviewed your prior records including labs and tests today  Refer for CT of chest, abdomen and pelvis because of unexplained weight loss. You'll be contacted regarding appointment time of this test. Your results will be called to you after review, usually within 72hours after test completion. If any changes need to be made, you will be notified at that same time.  Medications reviewed and updated, no changes recommended at this time.  Please schedule followup in 3-4 weeks to continue review, call sooner if problems.

## 2013-08-10 NOTE — Telephone Encounter (Signed)
Left msg on vm md has order abd.pelvis/w contrast & CT chest w/o contrast. Both test ruling out malignancy both should be with contrast. Is this ok...Monica [name]

## 2013-08-10 NOTE — Assessment & Plan Note (Signed)
New diagnosis October 2014 -check for panc malignancy given unexplained weight loss On metformin for same Last A1c at goal Prescription for glucometer and supplies provided 05/2013 Lab Results  Component Value Date   HGBA1C 6.7* 06/11/2013

## 2013-08-10 NOTE — Progress Notes (Signed)
Pre visit review using our clinic review tool, if applicable. No additional management support is needed unless otherwise documented below in the visit note. 

## 2013-08-10 NOTE — Telephone Encounter (Signed)
Called CT dept back spoke with Lattie Haw gave md response...Johny Chess

## 2013-08-10 NOTE — Telephone Encounter (Signed)
Yes - ok to change both to contrast thanks

## 2013-08-10 NOTE — Assessment & Plan Note (Signed)
New 02/2013 EGD 01/2013 with GERD changes FOB neg x 3 07/2013 so doubt GI loss

## 2013-08-10 NOTE — Assessment & Plan Note (Signed)
Wt Readings from Last 3 Encounters:  08/10/13 135 lb 6.4 oz (61.417 kg)  07/29/13 137 lb (62.143 kg)  07/17/13 140 lb (63.504 kg)   Unintentional weight loss: 158# in 02/2013 reviewed associated with iron defic anemia since 02/2013 - (new) Reviewed EGD 01/2013 (GERD changes and esoph dilation performed) Prior colo 05/18/09: sessile polyp - rec 5 yr f/u mammo and PAP ok 05/2013 per pt (gyn - [facility]) tsh ok Nonsmoker -ever Check Ct c/a/p r/o occult malignancy

## 2013-08-17 ENCOUNTER — Other Ambulatory Visit: Payer: Self-pay | Admitting: Ophthalmology

## 2013-09-04 ENCOUNTER — Encounter (INDEPENDENT_AMBULATORY_CARE_PROVIDER_SITE_OTHER): Payer: Self-pay

## 2013-09-07 ENCOUNTER — Other Ambulatory Visit (INDEPENDENT_AMBULATORY_CARE_PROVIDER_SITE_OTHER): Payer: Commercial Managed Care - HMO

## 2013-09-07 ENCOUNTER — Ambulatory Visit (INDEPENDENT_AMBULATORY_CARE_PROVIDER_SITE_OTHER): Payer: Commercial Managed Care - HMO | Admitting: Internal Medicine

## 2013-09-07 ENCOUNTER — Encounter: Payer: Self-pay | Admitting: Internal Medicine

## 2013-09-07 ENCOUNTER — Ambulatory Visit: Payer: Commercial Managed Care - HMO

## 2013-09-07 VITALS — BP 120/72 | HR 80 | Temp 98.2°F | Wt 136.4 lb

## 2013-09-07 DIAGNOSIS — D509 Iron deficiency anemia, unspecified: Secondary | ICD-10-CM

## 2013-09-07 DIAGNOSIS — R634 Abnormal weight loss: Secondary | ICD-10-CM

## 2013-09-07 DIAGNOSIS — E119 Type 2 diabetes mellitus without complications: Secondary | ICD-10-CM

## 2013-09-07 DIAGNOSIS — Z79899 Other long term (current) drug therapy: Secondary | ICD-10-CM

## 2013-09-07 LAB — CBC WITH DIFFERENTIAL/PLATELET
Basophils Absolute: 0 10*3/uL (ref 0.0–0.1)
Basophils Relative: 0.7 % (ref 0.0–3.0)
EOS PCT: 0.3 % (ref 0.0–5.0)
Eosinophils Absolute: 0 10*3/uL (ref 0.0–0.7)
HEMATOCRIT: 28.8 % — AB (ref 36.0–46.0)
Hemoglobin: 9 g/dL — ABNORMAL LOW (ref 12.0–15.0)
Lymphocytes Relative: 41.3 % (ref 12.0–46.0)
Lymphs Abs: 1.5 10*3/uL (ref 0.7–4.0)
MCHC: 31.3 g/dL (ref 30.0–36.0)
MCV: 74.6 fl — ABNORMAL LOW (ref 78.0–100.0)
MONOS PCT: 7.3 % (ref 3.0–12.0)
Monocytes Absolute: 0.3 10*3/uL (ref 0.1–1.0)
NEUTROS ABS: 1.8 10*3/uL (ref 1.4–7.7)
Neutrophils Relative %: 50.4 % (ref 43.0–77.0)
Platelets: 310 10*3/uL (ref 150.0–400.0)
RBC: 3.86 Mil/uL — ABNORMAL LOW (ref 3.87–5.11)
RDW: 17.8 % — ABNORMAL HIGH (ref 11.5–14.6)
WBC: 3.6 10*3/uL — AB (ref 4.5–10.5)

## 2013-09-07 LAB — BUN: BUN: 10 mg/dL (ref 6–23)

## 2013-09-07 LAB — HEMOGLOBIN A1C: Hgb A1c MFr Bld: 6.6 % — ABNORMAL HIGH (ref 4.6–6.5)

## 2013-09-07 LAB — FERRITIN: Ferritin: 4.3 ng/mL — ABNORMAL LOW (ref 10.0–291.0)

## 2013-09-07 LAB — CREATININE, SERUM: CREATININE: 0.7 mg/dL (ref 0.4–1.2)

## 2013-09-07 NOTE — Assessment & Plan Note (Signed)
Wt Readings from Last 3 Encounters:  09/07/13 136 lb 6.4 oz (61.871 kg)  08/10/13 135 lb 6.4 oz (61.417 kg)  07/29/13 137 lb (62.143 kg)   Unintentional weight loss: 158# in 02/2013 reviewed, stabilized in past 30 days associated with iron defic anemia since 02/2013 - (new), but heme neg stool x 3 07/2013 (home cards) Reviewed EGD 01/2013 (GERD changes and esoph dilation performed) Prior colo 05/18/09: sessile polyp - rec 5 yr f/u - to see GI next week mammo and PAP ok 05/2013 per pt (gyn - [facility]) tsh ok Nonsmoker -ever Prev ordered Ct c/a/p r/o occult malignancy, but not scheduled yet (??) - will schedule now

## 2013-09-07 NOTE — Progress Notes (Signed)
Pre visit review using our clinic review tool, if applicable. No additional management support is needed unless otherwise documented below in the visit note. 

## 2013-09-07 NOTE — Assessment & Plan Note (Signed)
New diagnosis October 2014 - Will check for ?panc malignancy given unexplained weight loss On metformin for same Last A1c at goal -check q3-51mo Prescription for glucometer and supplies provided 05/2013 Lab Results  Component Value Date   HGBA1C 6.7* 06/11/2013

## 2013-09-07 NOTE — Progress Notes (Signed)
Subjective:    Patient ID: Monica [name], female    DOB: 24-Nov-1940, 73 y.o.   MRN: [id number]  HPI  Patient is here for follow up -unintentional weight loss trend Also reviewed chronic medical issues and interval medical events  Past Medical History  Diagnosis Date  . Hypertension   . GERD (gastroesophageal reflux disease)     w/ HH (EGD 2010)  . 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 Stricture and stenosis of esophagus 2006  . Arthritis   . OSTEOPENIA   . Mixed hyperlipidemia   . LOW BACK PAIN, CHRONIC   . GLAUCOMA   . DEPRESSION, SITUATIONAL   . Chronic constipation   . ANXIETY   . Diabetes mellitus dx 02/2013    Review of Systems  Constitutional: Positive for fatigue and unexpected weight change (but stable in past 30 days). Negative for fever.  Respiratory: Negative for cough and shortness of breath.   Cardiovascular: Negative for chest pain and leg swelling.  Musculoskeletal: Positive for arthralgias (B thumbs, R>L). Negative for joint swelling and myalgias.       Objective:   Physical Exam  BP 120/72  Pulse 80  Temp(Src) 98.2 F (36.8 C) (Oral)  Wt 136 lb 6.4 oz (61.871 kg)  SpO2 99% Wt Readings from Last 3 Encounters:  09/07/13 136 lb 6.4 oz (61.871 kg)  08/10/13 135 lb 6.4 oz (61.417 kg)  07/29/13 137 lb (62.143 kg)   Constitutional: She is thin, but appears well-developed. No distress.  Neck: Normal range of motion. Neck supple. No JVD present. No thyromegaly present.  Cardiovascular: Normal rate, regular rhythm and normal heart sounds.  No murmur heard. No BLE edema. Pulmonary/Chest: Effort normal and breath sounds normal. No respiratory distress. She has no wheezes.  Psychiatric: She has a normal mood and affect. Her behavior is normal. Judgment and thought content normal.   Lab Results  Component Value Date   WBC 3.9* 07/29/2013   HGB 9.3* 07/29/2013   HCT 29.7* 07/29/2013   PLT 372.0 07/29/2013   GLUCOSE 82 07/17/2013   CHOL 150  03/06/2013   TRIG 110.0 03/06/2013   HDL 60.30 03/06/2013   LDLCALC 68 03/06/2013   ALT 17 03/06/2013   AST 21 03/06/2013   NA 140 07/17/2013   K 3.8 07/17/2013   CL 107 07/17/2013   CREATININE 0.7 07/17/2013   BUN 12 07/17/2013   CO2 27 07/17/2013   TSH 1.45 07/17/2013   HGBA1C 6.7* 06/11/2013   MICROALBUR 0.9 06/11/2013   Iron/TIBC/Ferritin    Component Value Date/Time   IRON 13* 07/29/2013 1215   FERRITIN 19.6 02/27/2011 1021    Dg Lumbar Spine Complete  07/29/2013   CLINICAL DATA:  Low back pain after falling 2 weeks ago.  EXAM: LUMBAR SPINE - COMPLETE 4+ VIEW  COMPARISON:  Lumbar spine radiographs 12/08/2008.  FINDINGS: There are 5 lumbar type vertebral bodies. The alignment is normal. There is stable mild disc space loss at L4-5 and L5-S1. There is mild facet disease inferiorly. No fracture or pars defect is demonstrated.  IMPRESSION: Stable mild lower lumbar spondylosis. No acute osseous findings or malalignment.   Electronically Signed   By: Camie Patience M.D.   On: 07/29/2013 16:08       Assessment & Plan:    Problem List Items Addressed This Visit   DM type 2 (diabetes mellitus, type 2)      New diagnosis October 2014 - Will check for ?panc malignancy  given unexplained weight loss On metformin for same Last A1c at goal -check q3-39mo Prescription for glucometer and supplies provided 05/2013 Lab Results  Component Value Date   HGBA1C 6.7* 06/11/2013      Relevant Orders      Hemoglobin A1c   Iron deficiency anemia     New 02/2013 EGD 01/2013 with GERD changes FOB neg x 3 07/2013 so doubt GI loss Recheck CBC with ferritin now       Relevant Orders      CBC with Differential      Ferritin   Loss of weight - Primary      Wt Readings from Last 3 Encounters:  09/07/13 136 lb 6.4 oz (61.871 kg)  08/10/13 135 lb 6.4 oz (61.417 kg)  07/29/13 137 lb (62.143 kg)   Unintentional weight loss: 158# in 02/2013 reviewed, stabilized in past 30 days associated with iron defic  anemia since 02/2013 - (new), but heme neg stool x 3 07/2013 (home cards) Reviewed EGD 01/2013 (GERD changes and esoph dilation performed) Prior colo 05/18/09: sessile polyp - rec 5 yr f/u - to see GI next week mammo and PAP ok 05/2013 per pt (gyn - [facility]) tsh ok Nonsmoker -ever Prev ordered Ct c/a/p r/o occult malignancy, but not scheduled yet (??) - will schedule now     Relevant Orders      CBC with Differential      Ferritin

## 2013-09-07 NOTE — Patient Instructions (Signed)
It was good to see you today.  We have reviewed your prior records including labs and tests today  Test(s) ordered today. Your results will be released to [place] (or called to you) after review, usually within 72hours after test completion. If any changes need to be made, you will be notified at that same time.  Refer for CT of chest, abdomen and pelvis because of unexplained weight loss. You'll be contacted regarding appointment time of this test. Your results will be called to you after review, usually within 72hours after test completion. If any changes need to be made, you will be notified at that same time.  Medications reviewed and updated -  Use Tylenol twice daily for arthritis, no other changes recommended at this time.  Please schedule followup in 3 months to continue review and check diabetes mellitus, call sooner if problems.

## 2013-09-07 NOTE — Assessment & Plan Note (Signed)
New 02/2013 EGD 01/2013 with GERD changes FOB neg x 3 07/2013 so doubt GI loss Recheck CBC with ferritin now

## 2013-09-10 ENCOUNTER — Ambulatory Visit (INDEPENDENT_AMBULATORY_CARE_PROVIDER_SITE_OTHER)
Admission: RE | Admit: 2013-09-10 | Discharge: 2013-09-10 | Disposition: A | Discharge status: Home / Self Care | Payer: Medicare HMO | Source: Ambulatory Visit | Attending: Internal Medicine | Admitting: Internal Medicine

## 2013-09-10 DIAGNOSIS — D509 Iron deficiency anemia, unspecified: Secondary | ICD-10-CM

## 2013-09-10 DIAGNOSIS — E119 Type 2 diabetes mellitus without complications: Secondary | ICD-10-CM

## 2013-09-10 DIAGNOSIS — R634 Abnormal weight loss: Secondary | ICD-10-CM

## 2013-09-10 MED ORDER — IOHEXOL 300 MG/ML  SOLN
100.0000 mL | Freq: Once | INTRAMUSCULAR | Status: AC | PRN
  Administered 2013-09-10: 100 mL via INTRAVENOUS

## 2013-09-14 ENCOUNTER — Encounter: Payer: Self-pay | Admitting: Gastroenterology

## 2013-09-14 ENCOUNTER — Other Ambulatory Visit (INDEPENDENT_AMBULATORY_CARE_PROVIDER_SITE_OTHER): Payer: Medicare HMO

## 2013-09-14 ENCOUNTER — Ambulatory Visit (INDEPENDENT_AMBULATORY_CARE_PROVIDER_SITE_OTHER): Payer: Commercial Managed Care - HMO | Admitting: Gastroenterology

## 2013-09-14 VITALS — BP 110/70 | HR 88 | Ht 66.0 in | Wt 133.4 lb

## 2013-09-14 DIAGNOSIS — D509 Iron deficiency anemia, unspecified: Secondary | ICD-10-CM | POA: Insufficient documentation

## 2013-09-14 DIAGNOSIS — R634 Abnormal weight loss: Secondary | ICD-10-CM

## 2013-09-14 DIAGNOSIS — D649 Anemia, unspecified: Secondary | ICD-10-CM

## 2013-09-14 LAB — COMPREHENSIVE METABOLIC PANEL
ALT: 9 U/L (ref 0–35)
AST: 19 U/L (ref 0–37)
Albumin: 4.1 g/dL (ref 3.5–5.2)
Alkaline Phosphatase: 48 U/L (ref 39–117)
BUN: 9 mg/dL (ref 6–23)
CALCIUM: 9.5 mg/dL (ref 8.4–10.5)
CHLORIDE: 106 meq/L (ref 96–112)
CO2: 27 meq/L (ref 19–32)
Creatinine, Ser: 0.9 mg/dL (ref 0.4–1.2)
GFR: 84.75 mL/min (ref >60.00)
Glucose, Bld: 76 mg/dL (ref 70–99)
Potassium: 3.7 mEq/L (ref 3.5–5.1)
SODIUM: 140 meq/L (ref 135–145)
Total Bilirubin: 0.6 mg/dL (ref 0.2–1.2)
Total Protein: 7.3 g/dL (ref 6.0–8.3)

## 2013-09-14 LAB — TSH: TSH: 1.74 u[IU]/mL (ref 0.35–5.50)

## 2013-09-14 MED ORDER — NA SULFATE-K SULFATE-MG SULF 17.5-3.13-1.6 GM/177ML PO SOLN: 1.0000 | Freq: Once | ORAL | Status: DC

## 2013-09-14 NOTE — Assessment & Plan Note (Addendum)
She has a new iron deficiency anemia with history of probable limited rectal bleeding but recent Hemoccult negative stool.  She is unsure whether this is vaginal or rectal bleeding.  She has a history of a colonic villous adenoma in 2011.  Chronic GI blood losses obviously a concern.  An occult colonic neoplasm should be ruled out.  Weight loss is also concerning although recent CT did not demonstrate any evidence for an occult malignancy.  She does have a benign-appearing thyroid nodule which at least raises the question of hyperthyroidism.    Recommendations #1 colonoscopy; if negative I will obtain followup Hemoccults

## 2013-09-14 NOTE — Assessment & Plan Note (Signed)
Almost 20 Powell weight loss in 6 months without obvious etiology.  CT is negative for occult malignancy.  She does have a 9-appearing thyroid nodule which at least raises the question of hyperthyroidism.  Recommendations #1 check thyroid function test #2 colonoscopy #3 check LFTs

## 2013-09-14 NOTE — Progress Notes (Signed)
_                                                                                                                History of Present Illness: 73 year old Afro-American female referred for evaluation of weight loss and rectal bleeding.  Over the past 6 months she's lost almost 20 pounds.  This is coincident with her starting metformin.  She suffers from chronic constipation.  In the last couple of months she's noted bright red blood on the toilet tissue with wiping although she is unsure whether this is rectal bleeding or vaginal bleeding. Recent hemoglobin was 9 and MCV 74.6.  In March serum iron was 13 and saturation 2.8%.  In October hemoglobin was 11.3.  In March Hemoccults x5 were negative. In September, 2014 she underwent dilation of an early esophageal stricture.  A tubulovillous adenoma was removed in 2011 at colonoscopy.  Esophageal manometry in September, 2014 was normal.  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  Patient's appetite is fair but this has not changed.  She denies abdominal pain or change in bowel habits.    Past Medical History  Diagnosis Date  . Hypertension   . GERD (gastroesophageal reflux disease)     w/ HH (EGD 2010)  . 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 Stricture and stenosis of esophagus 2006  . Arthritis   . OSTEOPENIA   . Mixed hyperlipidemia   . LOW BACK PAIN, CHRONIC   . GLAUCOMA   . DEPRESSION, SITUATIONAL   . Chronic constipation   . ANXIETY   . Diabetes mellitus dx 02/2013   Past Surgical History  Procedure Laterality Date  . Tubal ligation    . Back surgery    . Hemorroidectomy    . Rotator cuff repair      right   . Hysteroscopy w/d&c  07/20/2011    Procedure: DILATATION AND CURETTAGE /HYSTEROSCOPY;  Surgeon: Melina Schools, MD;  Location: [facility];  Service: Gynecology;  Laterality:  N/A;  . Polypectomy  07/20/2011    Procedure: POLYPECTOMY;  Surgeon: Melina Schools, MD;  Location: [facility];  Service: Gynecology;  Laterality: N/A;  . Esophageal manometry N/A 02/09/2013    Procedure: ESOPHAGEAL MANOMETRY (EM);  Surgeon: Sable Feil, MD;  Location: [facility];  Service: Endoscopy;  Laterality: N/A;   family history includes Breast cancer in her sister; Colon cancer (age of onset: 33) in her sister; Diabetes in her brother and mother; Heart failure in her father. Current Outpatient Prescriptions  Medication Sig Dispense Refill  . amLODipine (NORVASC) 5 MG tablet Take 1 tablet (5 mg total) by mouth daily.  90 tablet  3  . aspirin 81 MG tablet Take 81 mg by mouth daily.        [name] atorvastatin (LIPITOR) 10 MG tablet Take 1 tablet (10 mg total) by mouth daily.  90 tablet  1  . Blood Glucose Monitoring Suppl (ONE TOUCH ULTRA 2) W/DEVICE KIT Use to check blood sugar every morning  1 each  0  . Cholecalciferol (VITAMIN D) 400 UNITS capsule Take 400 Units by mouth daily.        [name] dexlansoprazole (DEXILANT) 60 MG capsule Take 1 capsule (60 mg total) by mouth daily.  90 capsule  3  . glucose blood (ONE TOUCH ULTRA TEST) test strip Use to check blood sugar every morning before breakfast  100 each  3  . metFORMIN (GLUCOPHAGE) 500 MG tablet Take 1 tablet (500 mg total) by mouth daily with breakfast.  90 tablet  1  . ONETOUCH DELICA LANCETS 81X MISC USE ONE LANCET TO CHECK BLOOD SUGAR EVERY MORNING BEFORE BREAKFAST  100 each  5  . polyethylene glycol powder (GLYCOLAX/MIRALAX) powder Take 17 g by mouth daily.  3350 g  1   No current facility-administered medications for this visit.   Allergies as of 09/14/2013 - Review Complete 09/14/2013  Allergen Reaction Noted  . Codeine  05/07/2007  . Hydrochlorothiazide  12/29/2007    reports that she has never smoked. She has never used smokeless tobacco. She reports that she does not drink alcohol or use illicit drugs.     Review of  Systems: Pertinent positive and negative review of systems were noted in the above HPI section. All other review of systems were otherwise negative.  Vital signs were reviewed in today's medical record Physical Exam: General: Well developed , well nourished, no acute distress Skin: anicteric Head: Normocephalic and atraumatic Eyes:  sclerae anicteric, EOMI Ears: Normal auditory acuity Mouth: No deformity or lesions Neck: Supple, no masses or thyromegaly Lungs: Clear throughout to auscultation Heart: Regular rate and rhythm; no murmurs, rubs or bruits Abdomen: Soft, non tender and non distended. No masses, hepatosplenomegaly or hernias noted. Normal Bowel sounds Rectal:deferred Musculoskeletal: Symmetrical with no gross deformities  Skin: No lesions on visible extremities Pulses:  Normal pulses noted Extremities: No clubbing, cyanosis, edema or deformities noted Neurological: Alert oriented x 4, grossly nonfocal Cervical Nodes:  No significant cervical adenopathy Inguinal Nodes: No significant inguinal adenopathy Psychological:  Alert and cooperative. Normal mood and affect  See Assessment and Plan under Problem List

## 2013-09-14 NOTE — Patient Instructions (Signed)
You have been scheduled for a colonoscopy with propofol. Please follow written instructions given to you at your visit today.  Please pick up your prep kit at the pharmacy within the next 1-3 days. If you use inhalers (even only as needed), please bring them with you on the day of your procedure. Your physician has requested that you go to www.startemmi.com and enter the access code given to you at your visit today. This web site gives a general overview about your procedure. However, you should still follow specific instructions given to you by our office regarding your preparation for the procedure.  Go to the basement for labs today 

## 2013-09-15 LAB — T4: T4, Total: 7.8 ug/dL (ref 5.0–12.5)

## 2013-09-15 NOTE — Progress Notes (Signed)
Quick Note:  Please inform the patient that lab work was normal and to continue current plan of action ______ 

## 2013-09-16 ENCOUNTER — Telehealth: Payer: Self-pay

## 2013-09-21 ENCOUNTER — Encounter: Payer: Self-pay | Admitting: Gastroenterology

## 2013-09-21 ENCOUNTER — Ambulatory Visit (AMBULATORY_SURGERY_CENTER): Payer: Commercial Managed Care - HMO | Admitting: Gastroenterology

## 2013-09-21 VITALS — BP 114/71 | HR 59 | Temp 96.9°F | Resp 19 | Ht 66.0 in | Wt 133.0 lb

## 2013-09-21 DIAGNOSIS — K648 Other hemorrhoids: Secondary | ICD-10-CM

## 2013-09-21 DIAGNOSIS — D126 Benign neoplasm of colon, unspecified: Secondary | ICD-10-CM

## 2013-09-21 DIAGNOSIS — D649 Anemia, unspecified: Secondary | ICD-10-CM

## 2013-09-21 HISTORY — PX: COLONOSCOPY: SHX174

## 2013-09-21 MED ORDER — SODIUM CHLORIDE 0.9 % IV SOLN: 500.0000 mL | INTRAVENOUS | Status: DC

## 2013-09-21 MED ORDER — DEXTROSE 5 % IV SOLN: INTRAVENOUS | Status: DC

## 2013-09-21 NOTE — Progress Notes (Signed)
Called to room to assist during endoscopic procedure.  Patient ID and intended procedure confirmed with present staff. Received instructions for my participation in the procedure from the performing physician.  

## 2013-09-21 NOTE — Patient Instructions (Signed)

## 2013-09-21 NOTE — Progress Notes (Signed)
Report to pacu rn, vss, bbs=clear 

## 2013-09-21 NOTE — Op Note (Signed)
[place]  [address] [place], [id number]   COLONOSCOPY PROCEDURE REPORT  PATIENT: Monica [name], Monica [name]  MR#: [id number] BIRTHDATE: September 06, 1940 , 73  yrs. old GENDER: Female ENDOSCOPIST: Inda Castle, MD REFERRED YK:[id number] Asa Lente, M.D. PROCEDURE DATE:  09/21/2013 PROCEDURE:   Colonoscopy with snare polypectomy First Screening Colonoscopy - Avg.  risk and is 50 yrs.  old or older Yes.  Prior Negative Screening - Now for repeat screening. N/A  History of Adenoma - Now for follow-up colonoscopy & has been > or = to 3 yrs.  N/A  Polyps Removed Today? Yes. ASA CLASS:   Class II INDICATIONS:Rectal Bleeding and Iron Deficiency Anemia. MEDICATIONS: MAC sedation, administered by CRNA and propofol (Diprivan) 250mg  IV  DESCRIPTION OF PROCEDURE:   After the risks benefits and alternatives of the procedure were thoroughly explained, informed consent was obtained.  A digital rectal exam revealed no abnormalities of the rectum.   The LB [id number]  endoscope was introduced through the anus and advanced to the cecum, which was identified by both the appendix and ileocecal valve. No adverse events experienced.   The quality of the prep was Suprep good  The instrument was then slowly withdrawn as the colon was fully examined.      COLON FINDINGS: A sessile polyp measuring 3 mm in size was found in the distal transverse colon.  A polypectomy was performed.  The resection was complete and the polyp tissue was completely retrieved.   Internal hemorrhoids were found.   The colon was otherwise normal.  There was no diverticulosis, inflammation, polyps or cancers unless previously stated.  Retroflexed views revealed no abnormalities. The time to cecum=3 minutes 58 seconds. Withdrawal time=9 minutes 44 seconds.  The scope was withdrawn and the procedure completed. COMPLICATIONS: There were no complications.  ENDOSCOPIC IMPRESSION: 1.   Sessile polyp measuring 3 mm  in size was found in the distal transverse colon; polypectomy was performed 2.   Internal hemorrhoids 3.   The colon was otherwise normal  rectal bleeding secondary to internal hemorrhoids  RECOMMENDATIONS: 1.  If the polyp(s) removed today are proven to be adenomatous (pre-cancerous) polyps, you will need a repeat colonoscopy in 5 years.  Otherwise you should continue to follow colorectal cancer screening guidelines for "routine risk" patients with colonoscopy in 10 years.  You will receive a letter within 1-2 weeks with the results of your biopsy as well as final recommendations.  Please call my office if you have not received a letter after 3 weeks. 2.  Followup hemeoccults 7-10 days   eSigned:  Inda Castle, MD 09/21/2013 2:34 PM   cc:   PATIENT NAME:  Monica [name], Monica [name] MR#: [id number]

## 2013-09-22 ENCOUNTER — Telehealth: Payer: Self-pay

## 2013-09-22 NOTE — Telephone Encounter (Signed)
No answer, left vm 

## 2013-09-25 ENCOUNTER — Encounter: Payer: Self-pay | Admitting: Gastroenterology

## 2013-10-20 ENCOUNTER — Encounter: Payer: Self-pay | Admitting: Family Medicine

## 2013-10-20 ENCOUNTER — Ambulatory Visit (INDEPENDENT_AMBULATORY_CARE_PROVIDER_SITE_OTHER): Payer: Commercial Managed Care - HMO | Admitting: Family Medicine

## 2013-10-20 VITALS — BP 130/80 | HR 72 | Ht 66.0 in | Wt 134.0 lb

## 2013-10-20 DIAGNOSIS — M545 Low back pain, unspecified: Secondary | ICD-10-CM | POA: Diagnosis not present

## 2013-10-20 NOTE — Progress Notes (Signed)
  [facility] [address], [place] [address] Phone: [phone] Subjective:    I'm seeing this patient by the request  of:  [name], MD   CC: back pain  [id number] Monica [name] is a 73 y.o. female coming in with complaint of chronic back pain. Patient states that she has had back pain for multiple years and did have a surgery for bulging disc between L4-L5 back in 1996. Patient states though in March she did fall and since then she's had more of an exacerbation of her back. Patient states it is fairly low in mostly on the right side. Denies any significant radiation to the legs or any numbness. Patient though does notice that she walks differently secondary to the pain. Patient does not like to lead with her right foot going up stairs because of pain in her back. Patient denies any nighttime awakening but can have some discomfort which causes falling asleep to be difficult. Patient does not like to use over-the-counter medications or prescription medications. Patient has had weight loss and is being worked up. Labs were reviewed by me today and we showed the patient did have a significant iron deficiency. In addition to this patient did have x-rays previously in March of 2015. These were reviewed by me. Patient does have some mild narrowing and osteoarthritic changes of the L4-L5 levels otherwise unremarkable. Patient rates the severity of 6/10.     Past medical history, social, surgical and family history all reviewed in electronic medical record.   Review of Systems: No headache, visual changes, nausea, vomiting, diarrhea, constipation, dizziness, abdominal pain, skin rash, fevers, chills, night sweats, weight loss, swollen lymph nodes, body aches, joint swelling, muscle aches, chest pain, shortness of breath, mood changes.   Objective Blood pressure 130/80, pulse 72, height 5\' 6"  (1.676 m), weight 134 lb (60.782 kg), SpO2 96.00%.    General: No apparent distress alert and oriented x3 mood and affect normal, dressed appropriately.  HEENT: Pupils equal, extraocular movements intact  Respiratory: Patient's speak in full sentences and does not appear short of breath  Cardiovascular: No lower extremity edema, non tender, no erythema  Skin: Warm dry intact with no signs of infection or rash on extremities or on axial skeleton.  Abdomen: Soft nontender  Neuro: Cranial nerves II through XII are intact, neurovascularly intact in all extremities with 2+ DTRs and 2+ pulses.  Lymph: No lymphadenopathy of posterior or anterior cervical chain or axillae bilaterally.  Gait normal with good balance and coordination.  MSK:  Non tender with full range of motion and good stability and symmetric strength and tone of shoulders, elbows, wrist, hip, knee and ankles bilaterally.  Back Exam:  Inspection: Unremarkable  Motion: Flexion 35 deg, Extension 35 deg, Side Bending to 35 deg bilaterally,  Rotation to 45 deg bilaterally  SLR laying: Negative  XSLR laying: Negative  Palpable tenderness: Pain over the right SI joint. FABER: Positive right. Sensory change: Gross sensation intact to all lumbar and sacral dermatomes.  Reflexes: 2+ at both patellar tendons, 2+ at achilles tendons, Babinski's downgoing.  Strength at foot  Plantar-flexion: 5/5 Dorsi-flexion: 5/5 Eversion: 5/5 Inversion: 5/5  Leg strength  Quad: 5/5 Hamstring: 5/5 Hip flexor: 4/5 on the right compared to full strength on left. Hip abductors: 4/5 laterally Gait unremarkable.     Impression and Recommendations:     This case required medical decision making of moderate complexity.

## 2013-10-20 NOTE — Assessment & Plan Note (Signed)
Patient's low back pain is likely multifactorial. Patient does not have any significant osteoporosis and has more of an osteopenia occurring on her bone scan previously. We discussed the importance of being active in trying to do more muscle strengthening. Patient does have an underlying iron deficiency that can't be causing some discomfort as well so we discussed supplementation. The discussed different medications the patient like to continue with natural supplementation. We discussed over-the-counter medications that could be helpful. Patient was given a handout and we did show patient proper technique today. Patient to try these interventions and come back and see me again in 4 weeks.  [id number]; 15 additional minutes spent for Therapeutic exercises as stated in above notes

## 2013-10-20 NOTE — Patient Instructions (Signed)
Very nice to meet you Talk to Dr. Carlean Jews about the metformin Try exercises 3 times a week.  Boost or ensure 2 times a day can increase your protein.  Take tylenol 650 mg three times a day is the best evidence based medicine we have for arthritis.  Glucosamine sulfate 750mg  twice a day is a supplement that has been shown to help moderate to severe arthritis. Vitamin D 2000 IU daily Fish oil 2 grams daily.  Tumeric 500mg  twice daily.  Iron 325mg  Daily. Capsaicin topically up to four times a day may also help with pain. Shoe inserts with good arch support may be helpful.  [employer] orthotics at [employer] could help.  Water aerobics and cycling with low resistance are the best two types of exercise for arthritis. Come back and see me in 4 weeks.

## 2013-10-23 ENCOUNTER — Other Ambulatory Visit: Payer: Self-pay | Admitting: Internal Medicine

## 2013-10-23 NOTE — Telephone Encounter (Signed)
Last office visit with you was 09/07/13--Rx last filled 06/04/13 #90 with 1 refill--please advise

## 2013-11-17 ENCOUNTER — Encounter: Payer: Self-pay | Admitting: Family Medicine

## 2013-11-17 ENCOUNTER — Ambulatory Visit (INDEPENDENT_AMBULATORY_CARE_PROVIDER_SITE_OTHER): Payer: Commercial Managed Care - HMO | Admitting: Family Medicine

## 2013-11-17 VITALS — BP 124/82 | HR 68 | Ht 66.0 in | Wt 134.0 lb

## 2013-11-17 DIAGNOSIS — M545 Low back pain, unspecified: Secondary | ICD-10-CM

## 2013-11-17 NOTE — Progress Notes (Signed)
  [facility] [address], [place] [address] Phone: [phone] Subjective:     CC: back pain followup to  [id number] Monica [name] is a 73 y.o. female coming in with complaint of chronic back pain. Patient states that she has had back pain for multiple years and did have a surgery for bulging disc between L4-L5 back in 1996. Patient states though in March she did fall and since then she's had more of an exacerbation of her back. Patient states that she is now approximately 70% better after conservative treatment with over-the-counter medications, icing, and home exercise program. Patient denies any radiation to legs and states that she is able to do all activities of daily living. Patient states though she walks for a long amount of time she can have some discomfort. Patient denies any radiation down her legs so any any       Past medical history, social, surgical and family history all reviewed in electronic medical record.   Review of Systems: No headache, visual changes, nausea, vomiting, diarrhea, constipation, dizziness, abdominal pain, skin rash, fevers, chills, night sweats, weight loss, swollen lymph nodes, body aches, joint swelling, muscle aches, chest pain, shortness of breath, mood changes.   Objective Blood pressure 124/82, pulse 68, height 5\' 6"  (1.676 m), weight 134 lb (60.782 kg), SpO2 97.00%.  General: No apparent distress alert and oriented x3 mood and affect normal, dressed appropriately.  HEENT: Pupils equal, extraocular movements intact  Respiratory: Patient's speak in full sentences and does not appear short of breath  Cardiovascular: No lower extremity edema, non tender, no erythema  Skin: Warm dry intact with no signs of infection or rash on extremities or on axial skeleton.  Abdomen: Soft nontender  Neuro: Cranial nerves II through XII are intact, neurovascularly intact in all extremities with 2+ DTRs and 2+ pulses.    Lymph: No lymphadenopathy of posterior or anterior cervical chain or axillae bilaterally.  Gait normal with good balance and coordination.  MSK:  Non tender with full range of motion and good stability and symmetric strength and tone of shoulders, elbows, wrist, hip, knee and ankles bilaterally.  Back Exam:  Inspection: Unremarkable  Motion: Flexion 45 deg, Extension 35 deg, Side Bending to 35 deg bilaterally,  Rotation to 45 deg bilaterally  SLR laying: Negative  XSLR laying: Negative  Palpable tenderness: Minimal tenderness over the right SI joint FABER: Positive right to improve occiput Sensory change: Gross sensation intact to all lumbar and sacral dermatomes.  Reflexes: 2+ at both patellar tendons, 2+ at achilles tendons, Babinski's downgoing.  Strength at foot  Plantar-flexion: 5/5 Dorsi-flexion: 5/5 Eversion: 5/5 Inversion: 5/5  Leg strength  Quad: 5/5 Hamstring: 5/5 Hip flexor: 5/5 . Hip abductors: Mild improvement in strength Gait unremarkable.     Impression and Recommendations:     This case required medical decision making of moderate complexity.

## 2013-11-17 NOTE — Patient Instructions (Signed)
Good to see you Consider physical therapy and call us if you want.  Continue the exercises 3 times a week.  Continue the other medications.  Ice is your best friend Continue good shoes.  See me when you need me.

## 2013-11-17 NOTE — Assessment & Plan Note (Signed)
Patient has made some improvement. We discussed the possibility of formal physical therapy which patient declined. Patient will continue at home exercises, icing protocol, and over-the-counter medicines. His lungs patient continues to improve she will followup on an as-needed basis.

## 2013-12-07 ENCOUNTER — Ambulatory Visit: Payer: Commercial Managed Care - HMO | Admitting: Internal Medicine

## 2013-12-09 ENCOUNTER — Ambulatory Visit (INDEPENDENT_AMBULATORY_CARE_PROVIDER_SITE_OTHER): Payer: Commercial Managed Care - HMO | Admitting: Internal Medicine

## 2013-12-09 ENCOUNTER — Encounter: Payer: Self-pay | Admitting: Internal Medicine

## 2013-12-09 ENCOUNTER — Other Ambulatory Visit (INDEPENDENT_AMBULATORY_CARE_PROVIDER_SITE_OTHER): Payer: Commercial Managed Care - HMO

## 2013-12-09 VITALS — BP 140/88 | HR 68 | Temp 97.7°F | Ht 66.0 in | Wt 135.5 lb

## 2013-12-09 DIAGNOSIS — R634 Abnormal weight loss: Secondary | ICD-10-CM

## 2013-12-09 DIAGNOSIS — E119 Type 2 diabetes mellitus without complications: Secondary | ICD-10-CM

## 2013-12-09 DIAGNOSIS — E782 Mixed hyperlipidemia: Secondary | ICD-10-CM

## 2013-12-09 DIAGNOSIS — E041 Nontoxic single thyroid nodule: Secondary | ICD-10-CM

## 2013-12-09 DIAGNOSIS — D509 Iron deficiency anemia, unspecified: Secondary | ICD-10-CM

## 2013-12-09 LAB — CBC WITH DIFFERENTIAL/PLATELET
BASOS PCT: 0.6 % (ref 0.0–3.0)
Basophils Absolute: 0 10*3/uL (ref 0.0–0.1)
EOS PCT: 0.4 % (ref 0.0–5.0)
Eosinophils Absolute: 0 10*3/uL (ref 0.0–0.7)
HCT: 39.1 % (ref 36.0–46.0)
HEMOGLOBIN: 12.6 g/dL (ref 12.0–15.0)
LYMPHS ABS: 1.6 10*3/uL (ref 0.7–4.0)
Lymphocytes Relative: 48.4 % — ABNORMAL HIGH (ref 12.0–46.0)
MCHC: 32.2 g/dL (ref 30.0–36.0)
MCV: 82.1 fl (ref 78.0–100.0)
Monocytes Absolute: 0.2 10*3/uL (ref 0.1–1.0)
Monocytes Relative: 7.1 % (ref 3.0–12.0)
NEUTROS ABS: 1.4 10*3/uL (ref 1.4–7.7)
Neutrophils Relative %: 43.5 % (ref 43.0–77.0)
PLATELETS: 263 10*3/uL (ref 150.0–400.0)
RBC: 4.76 Mil/uL (ref 3.87–5.11)
RDW: 28.9 % — ABNORMAL HIGH (ref 11.5–15.5)
WBC: 3.3 10*3/uL — AB (ref 4.0–10.5)

## 2013-12-09 LAB — LIPID PANEL
CHOL/HDL RATIO: 2
Cholesterol: 138 mg/dL (ref 0–200)
HDL: 61.8 mg/dL (ref >39.00)
LDL CALC: 67 mg/dL (ref 0–99)
NONHDL: 76.2
Triglycerides: 44 mg/dL (ref 0.0–149.0)
VLDL: 8.8 mg/dL (ref 0.0–40.0)

## 2013-12-09 LAB — FERRITIN: FERRITIN: 12.1 ng/mL (ref 10.0–291.0)

## 2013-12-09 LAB — HEMOGLOBIN A1C: Hgb A1c MFr Bld: 6 % (ref 4.6–6.5)

## 2013-12-09 MED ORDER — FERROUS SULFATE 325 (65 FE) MG PO TABS: 325.0000 mg | ORAL_TABLET | Freq: Every day | ORAL | Status: DC

## 2013-12-09 NOTE — Progress Notes (Signed)
Pre visit review using our clinic review tool, if applicable. No additional management support is needed unless otherwise documented below in the visit note. 

## 2013-12-09 NOTE — Assessment & Plan Note (Signed)
New diagnosis October 2014 - No evidence for panc malignancy on CT 08/2013 (checked for unexplained weight loss) On metformin for same Last A1c at goal -check q3-40mo Prescription for glucometer and supplies provided 05/2013 Lab Results  Component Value Date   HGBA1C 6.6* 09/07/2013

## 2013-12-09 NOTE — Assessment & Plan Note (Signed)
Wt Readings from Last 3 Encounters:  12/09/13 135 lb 8 oz (61.462 kg)  11/17/13 134 lb (60.782 kg)  10/20/13 134 lb (60.782 kg)   Unintentional weight loss: 158# in 02/2013 reviewed stabilized since 07/2013 associated with iron defic anemia since 02/2013 - (new), but heme neg stool x 3 07/2013 (home cards); internal hemorrhoids on 09/21/13 colo - now on oral iron Reviewed EGD 01/2013 (GERD changes and esoph dilation performed) Prior colo 05/18/09: sessile polyp; 09/21/13: adenoma polyp - rec 5 yr f/u  mammo and PAP ok 05/2013 per pt (gyn - [facility]), but +intermittent vaginal bleeding tsh ok - nonsp thyroid nodule R on CT 09/10/13 - order soft tissue US Nonsmoker -ever CT c/a/p 09/10/13 without  occult malignancy

## 2013-12-09 NOTE — Assessment & Plan Note (Signed)
New dx 02/2013 EGD 01/2013 with GERD changes FOB neg x 3 07/2013  Colo 09/21/13: adenoma polyp and internal hemorrhoids  CT C/A/P 09/10/13 without specific concern/explaination for weight loss Recheck CBC with ferritin now - advise oral iron  Refer to heme

## 2013-12-09 NOTE — Patient Instructions (Signed)
It was good to see you today.  We have reviewed your prior records including labs and tests today  Test(s) ordered today. Your results will be released to [facility] (or called to you) after review, usually within 72hours after test completion. If any changes need to be made, you will be notified at that same time.  Medications reviewed and updated, no changes recommended at this time.  we'll make referral to hematologist to talk about low iron/anemia and for ultrasound of your thyroid . Our office will contact you regarding appointment(s) once made.  Please schedule followup in 6 months, call sooner if problems.

## 2013-12-09 NOTE — Progress Notes (Signed)
Subjective:    Patient ID: Monica [name], female    DOB: February 28, 1941, 73 y.o.   MRN: [id number]  HPI  Patient is here for follow up  Reviewed chronic medical issues and interval medical events  Past Medical History  Diagnosis Date  . Hypertension   . GERD (gastroesophageal reflux disease)     w/ HH (EGD 2010)  . 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 Stricture and stenosis of esophagus 2006  . Arthritis   . OSTEOPENIA   . Mixed hyperlipidemia   . LOW BACK PAIN, CHRONIC   . GLAUCOMA   . DEPRESSION, SITUATIONAL   . Chronic constipation   . ANXIETY   . Diabetes mellitus dx 02/2013    Review of Systems  Constitutional: Positive for fatigue and unexpected weight change (loss). Negative for fever, chills and diaphoresis.  Respiratory: Negative for cough and shortness of breath.   Cardiovascular: Negative for chest pain and leg swelling.  Gastrointestinal: Negative for abdominal pain, diarrhea, constipation and blood in stool.       Objective:   Physical Exam  BP 140/88  Pulse 68  Temp(Src) 97.7 F (36.5 C) (Oral)  Ht 5\' 6"  (1.676 m)  Wt 135 lb 8 oz (61.462 kg)  BMI 21.88 kg/m2  SpO2 99% Wt Readings from Last 3 Encounters:  12/09/13 135 lb 8 oz (61.462 kg)  11/17/13 134 lb (60.782 kg)  10/20/13 134 lb (60.782 kg)   Constitutional: She appears well-developed and well-nourished. No distress.  Neck: Normal range of motion. Neck supple. No JVD present. No thyromegaly present.  Cardiovascular: Normal rate, regular rhythm and normal heart sounds.  No murmur heard. No BLE edema. Pulmonary/Chest: Effort normal and breath sounds normal. No respiratory distress. She has no wheezes.  Psychiatric: She has a normal mood and affect. Her behavior is normal. Judgment and thought content normal.   Lab Results  Component Value Date   WBC 3.6* 09/07/2013   HGB 9.0* 09/07/2013   HCT 28.8* 09/07/2013   PLT 310.0 09/07/2013   GLUCOSE 76 09/14/2013   CHOL 150 03/06/2013   TRIG  110.0 03/06/2013   HDL 60.30 03/06/2013   LDLCALC 68 03/06/2013   ALT 9 09/14/2013   AST 19 09/14/2013   NA 140 09/14/2013   K 3.7 09/14/2013   CL 106 09/14/2013   CREATININE 0.9 09/14/2013   BUN 9 09/14/2013   CO2 27 09/14/2013   TSH 1.74 09/14/2013   HGBA1C 6.6* 09/07/2013   MICROALBUR 0.9 06/11/2013    Ct Chest W Contrast  09/10/2013   CLINICAL DATA:  Unexplained weight loss in 73 year old female.  EXAM: CT CHEST, ABDOMEN, AND PELVIS WITH CONTRAST  TECHNIQUE: Multidetector CT imaging of the chest, abdomen and pelvis was performed following the standard protocol during bolus administration of intravenous contrast.  CONTRAST:  167mL OMNIPAQUE IOHEXOL 300 MG/ML  SOLN  COMPARISON:  Report from CT abdomen and pelvis 05/31/2011  FINDINGS: CT CHEST FINDINGS  Imaged portion of the thyroid gland demonstrates a low-density 6.5 mm nodule in the right thyroid lobe. There are no prior chest CTs for comparison. Heart size is mildly enlarged. No visible coronary artery calcification. Thoracic aorta is normal in caliber and demonstrates mild noncalcified plaque in the proximal descending thoracic aorta. Esophagus appears within normal limits. Normal pleural. Negative for pleural effusion. Negative for lymphadenopathy in the supraclavicular, mediastinal, hilar, retrocrural, or axillary stations.  The trachea and mainstem bronchi are patent. The lungs are well expanded and  clear. No pulmonary mass or interstitial abnormality identified.  No acute or suspicious bony abnormality. Thoracic spine vertebral bodies normal in height and alignment.  CT ABDOMEN AND PELVIS FINDINGS  Again noted is a large amount peripherally enhancing lesion in the right hepatic lobe that measures approximately 7.8 x 7.1 x 7.3 cm. The peripheral enhancement is discontinuous and nodular. On delayed images, there is progressive but incomplete filling of the central portion of the mass. Findings are consistent with a benign hemangioma. This was previously  measured to be 8.1 x 7.9 cm transverse dimension on the CT of 05/31/2011. Two additional but much smaller and similar appearing peripherally enhancing masses are noted. One is in the posterior right hepatic lobe measuring approximately 2 cm greatest diameter, an the other areas in the anterior right hepatic lobe measuring 1.8 cm greatest diameter. These are also consistent with benign hemangiomas member present on the prior CT of 2013. There also 2 circumscribed low-density lesions in the right hepatic lobe consistent with benign cysts. No suspicious hepatic lesion. The liver is normal in size. Negative for biliary ductal dilatation.  The pancreas, adrenal glands, spleen, and kidneys are within normal limits. Both ureters are normal in caliber.  Stomach is moderately distended with oral contrast and demonstrates normal wall thickness. Small bowel loops are normal in caliber and wall thickness. The appendix is normal. There is a moderate volume of stool in the colon. Colonic wall appears normal in thickness.  Normal appearance of the urinary bladder. The uterus is retroverted and contains multiple rounded masses consistent with uterine fibroids. Some of these contain internal calcification. No adnexal mass is identified.  Negative for mesenteric or retroperitoneal lymphadenopathy in the abdomen or pelvis. Negative for ascites. Peritoneal cavity and paraspinal soft tissues are unremarkable. Posterior disc bulge noted at L4-L5. Disc space narrowing at L5-S1. Vertebral bodies are normal in height and alignment. No acute or suspicious bony abnormality.  IMPRESSION: 1. No explanation for patient weight loss is identified in the chest, abdomen, or pelvis. Specifically, no suspicious mass lesion, lymphadenopathy, inflammatory changes, or bony lesion. 2. Benign hepatic hemangiomas as described above, without significant change since prior CT of 2013. 3. Multiple uterine fibroids. 4. Degenerative changes of the lower lumbar  spine as described above. 5. 6.5 mm nonspecific right thyroid lobe nodule. 6. Mild cardiomegaly.   Electronically Signed   By: Curlene Dolphin M.D.   On: 09/10/2013 13:29   Ct Abdomen Pelvis W Contrast  09/10/2013   CLINICAL DATA:  Unexplained weight loss in 73 year old female.  EXAM: CT CHEST, ABDOMEN, AND PELVIS WITH CONTRAST  TECHNIQUE: Multidetector CT imaging of the chest, abdomen and pelvis was performed following the standard protocol during bolus administration of intravenous contrast.  CONTRAST:  122mL OMNIPAQUE IOHEXOL 300 MG/ML  SOLN  COMPARISON:  Report from CT abdomen and pelvis 05/31/2011  FINDINGS: CT CHEST FINDINGS  Imaged portion of the thyroid gland demonstrates a low-density 6.5 mm nodule in the right thyroid lobe. There are no prior chest CTs for comparison. Heart size is mildly enlarged. No visible coronary artery calcification. Thoracic aorta is normal in caliber and demonstrates mild noncalcified plaque in the proximal descending thoracic aorta. Esophagus appears within normal limits. Normal pleural. Negative for pleural effusion. Negative for lymphadenopathy in the supraclavicular, mediastinal, hilar, retrocrural, or axillary stations.  The trachea and mainstem bronchi are patent. The lungs are well expanded and clear. No pulmonary mass or interstitial abnormality identified.  No acute or suspicious bony abnormality. Thoracic spine vertebral  bodies normal in height and alignment.  CT ABDOMEN AND PELVIS FINDINGS  Again noted is a large amount peripherally enhancing lesion in the right hepatic lobe that measures approximately 7.8 x 7.1 x 7.3 cm. The peripheral enhancement is discontinuous and nodular. On delayed images, there is progressive but incomplete filling of the central portion of the mass. Findings are consistent with a benign hemangioma. This was previously measured to be 8.1 x 7.9 cm transverse dimension on the CT of 05/31/2011. Two additional but much smaller and similar appearing  peripherally enhancing masses are noted. One is in the posterior right hepatic lobe measuring approximately 2 cm greatest diameter, an the other areas in the anterior right hepatic lobe measuring 1.8 cm greatest diameter. These are also consistent with benign hemangiomas member present on the prior CT of 2013. There also 2 circumscribed low-density lesions in the right hepatic lobe consistent with benign cysts. No suspicious hepatic lesion. The liver is normal in size. Negative for biliary ductal dilatation.  The pancreas, adrenal glands, spleen, and kidneys are within normal limits. Both ureters are normal in caliber.  Stomach is moderately distended with oral contrast and demonstrates normal wall thickness. Small bowel loops are normal in caliber and wall thickness. The appendix is normal. There is a moderate volume of stool in the colon. Colonic wall appears normal in thickness.  Normal appearance of the urinary bladder. The uterus is retroverted and contains multiple rounded masses consistent with uterine fibroids. Some of these contain internal calcification. No adnexal mass is identified.  Negative for mesenteric or retroperitoneal lymphadenopathy in the abdomen or pelvis. Negative for ascites. Peritoneal cavity and paraspinal soft tissues are unremarkable. Posterior disc bulge noted at L4-L5. Disc space narrowing at L5-S1. Vertebral bodies are normal in height and alignment. No acute or suspicious bony abnormality.  IMPRESSION: 1. No explanation for patient weight loss is identified in the chest, abdomen, or pelvis. Specifically, no suspicious mass lesion, lymphadenopathy, inflammatory changes, or bony lesion. 2. Benign hepatic hemangiomas as described above, without significant change since prior CT of 2013. 3. Multiple uterine fibroids. 4. Degenerative changes of the lower lumbar spine as described above. 5. 6.5 mm nonspecific right thyroid lobe nodule. 6. Mild cardiomegaly.   Electronically Signed   By:  Curlene Dolphin M.D.   On: 09/10/2013 13:29       Assessment & Plan:   Problem List Items Addressed This Visit   DM type 2 (diabetes mellitus, type 2)      New diagnosis October 2014 - No evidence for panc malignancy on CT 08/2013 (checked for unexplained weight loss) On metformin for same Last A1c at goal -check q3-73mo Prescription for glucometer and supplies provided 05/2013 Lab Results  Component Value Date   HGBA1C 6.6* 09/07/2013      Relevant Orders      Hemoglobin A1c      Lipid panel   Iron deficiency anemia, unspecified - Primary     New dx 02/2013 EGD 01/2013 with GERD changes FOB neg x 3 07/2013  Colo 09/21/13: adenoma polyp and internal hemorrhoids  CT C/A/P 09/10/13 without specific concern/explaination for weight loss Recheck CBC with ferritin now - advise oral iron  Refer to heme    Relevant Medications      ferrous sulfate tablet 325 mg   Other Relevant Orders      CBC with Differential      Ferritin      Ambulatory referral to Hematology / Oncology   Loss of  weight      Wt Readings from Last 3 Encounters:  12/09/13 135 lb 8 oz (61.462 kg)  11/17/13 134 lb (60.782 kg)  10/20/13 134 lb (60.782 kg)   Unintentional weight loss: 158# in 02/2013 reviewed stabilized since 07/2013 associated with iron defic anemia since 02/2013 - (new), but heme neg stool x 3 07/2013 (home cards); internal hemorrhoids on 09/21/13 colo - now on oral iron Reviewed EGD 01/2013 (GERD changes and esoph dilation performed) Prior colo 05/18/09: sessile polyp; 09/21/13: adenoma polyp - rec 5 yr f/u  mammo and PAP ok 05/2013 per pt (gyn - [facility]), but +intermittent vaginal bleeding tsh ok - nonsp thyroid nodule R on CT 09/10/13 - order soft tissue US Nonsmoker -ever CT c/a/p 09/10/13 without  occult malignancy    MIXED HYPERLIPIDEMIA     On low-dose Lipitor, tolerating well Last LDL at goal Check annually, titrate as needed    Relevant Orders      Lipid panel    Other Visit Diagnoses    Thyroid nodule        Relevant Orders       US Soft Tissue Head/Neck

## 2013-12-09 NOTE — Assessment & Plan Note (Signed)
On low-dose Lipitor, tolerating well Last LDL at goal Check annually, titrate as needed

## 2013-12-14 ENCOUNTER — Ambulatory Visit
Admission: RE | Admit: 2013-12-14 | Discharge: 2013-12-14 | Disposition: A | Discharge status: Home / Self Care | Payer: Commercial Managed Care - HMO | Source: Ambulatory Visit | Attending: Internal Medicine | Admitting: Internal Medicine

## 2013-12-14 DIAGNOSIS — E041 Nontoxic single thyroid nodule: Secondary | ICD-10-CM

## 2013-12-15 ENCOUNTER — Other Ambulatory Visit: Payer: Self-pay | Admitting: Internal Medicine

## 2013-12-17 ENCOUNTER — Encounter (HOSPITAL_COMMUNITY): Payer: Medicare HMO | Attending: Hematology and Oncology

## 2013-12-17 ENCOUNTER — Encounter (HOSPITAL_COMMUNITY): Payer: Self-pay

## 2013-12-17 VITALS — BP 139/75 | HR 74 | Temp 97.7°F | Resp 16 | Ht 66.0 in | Wt 138.2 lb

## 2013-12-17 DIAGNOSIS — K909 Intestinal malabsorption, unspecified: Secondary | ICD-10-CM

## 2013-12-17 DIAGNOSIS — K219 Gastro-esophageal reflux disease without esophagitis: Secondary | ICD-10-CM | POA: Insufficient documentation

## 2013-12-17 DIAGNOSIS — F3289 Other specified depressive episodes: Secondary | ICD-10-CM | POA: Diagnosis not present

## 2013-12-17 DIAGNOSIS — K2100 Gastro-esophageal reflux disease with esophagitis, without bleeding: Secondary | ICD-10-CM

## 2013-12-17 DIAGNOSIS — R5383 Other fatigue: Secondary | ICD-10-CM | POA: Diagnosis present

## 2013-12-17 DIAGNOSIS — D509 Iron deficiency anemia, unspecified: Secondary | ICD-10-CM

## 2013-12-17 DIAGNOSIS — K222 Esophageal obstruction: Secondary | ICD-10-CM | POA: Diagnosis not present

## 2013-12-17 DIAGNOSIS — D126 Benign neoplasm of colon, unspecified: Secondary | ICD-10-CM | POA: Diagnosis not present

## 2013-12-17 DIAGNOSIS — K5900 Constipation, unspecified: Secondary | ICD-10-CM | POA: Insufficient documentation

## 2013-12-17 DIAGNOSIS — M899 Disorder of bone, unspecified: Secondary | ICD-10-CM | POA: Diagnosis not present

## 2013-12-17 DIAGNOSIS — E119 Type 2 diabetes mellitus without complications: Secondary | ICD-10-CM | POA: Diagnosis not present

## 2013-12-17 DIAGNOSIS — R5381 Other malaise: Secondary | ICD-10-CM | POA: Diagnosis present

## 2013-12-17 DIAGNOSIS — M129 Arthropathy, unspecified: Secondary | ICD-10-CM | POA: Diagnosis not present

## 2013-12-17 DIAGNOSIS — R634 Abnormal weight loss: Secondary | ICD-10-CM | POA: Insufficient documentation

## 2013-12-17 DIAGNOSIS — G8929 Other chronic pain: Secondary | ICD-10-CM | POA: Insufficient documentation

## 2013-12-17 DIAGNOSIS — Z7982 Long term (current) use of aspirin: Secondary | ICD-10-CM | POA: Diagnosis not present

## 2013-12-17 DIAGNOSIS — F329 Major depressive disorder, single episode, unspecified: Secondary | ICD-10-CM | POA: Diagnosis not present

## 2013-12-17 DIAGNOSIS — Z888 Allergy status to other drugs, medicaments and biological substances status: Secondary | ICD-10-CM | POA: Diagnosis not present

## 2013-12-17 DIAGNOSIS — E782 Mixed hyperlipidemia: Secondary | ICD-10-CM | POA: Diagnosis not present

## 2013-12-17 DIAGNOSIS — D508 Other iron deficiency anemias: Secondary | ICD-10-CM

## 2013-12-17 DIAGNOSIS — M545 Low back pain, unspecified: Secondary | ICD-10-CM | POA: Insufficient documentation

## 2013-12-17 DIAGNOSIS — M949 Disorder of cartilage, unspecified: Secondary | ICD-10-CM

## 2013-12-17 DIAGNOSIS — D259 Leiomyoma of uterus, unspecified: Secondary | ICD-10-CM | POA: Diagnosis not present

## 2013-12-17 DIAGNOSIS — Z885 Allergy status to narcotic agent status: Secondary | ICD-10-CM | POA: Diagnosis not present

## 2013-12-17 DIAGNOSIS — F411 Generalized anxiety disorder: Secondary | ICD-10-CM | POA: Insufficient documentation

## 2013-12-17 DIAGNOSIS — H409 Unspecified glaucoma: Secondary | ICD-10-CM | POA: Insufficient documentation

## 2013-12-17 DIAGNOSIS — D500 Iron deficiency anemia secondary to blood loss (chronic): Secondary | ICD-10-CM | POA: Insufficient documentation

## 2013-12-17 DIAGNOSIS — I10 Essential (primary) hypertension: Secondary | ICD-10-CM | POA: Insufficient documentation

## 2013-12-17 DIAGNOSIS — Z79899 Other long term (current) drug therapy: Secondary | ICD-10-CM | POA: Diagnosis not present

## 2013-12-17 DIAGNOSIS — K589 Irritable bowel syndrome without diarrhea: Secondary | ICD-10-CM | POA: Diagnosis not present

## 2013-12-17 DIAGNOSIS — K9089 Other intestinal malabsorption: Secondary | ICD-10-CM | POA: Diagnosis not present

## 2013-12-17 LAB — RETICULOCYTES
RBC.: 4.62 MIL/uL (ref 3.87–5.11)
RETIC CT PCT: 0.7 % (ref 0.4–3.1)
Retic Count, Absolute: 32.3 10*3/uL (ref 19.0–186.0)

## 2013-12-17 LAB — COMPREHENSIVE METABOLIC PANEL
ALK PHOS: 55 U/L (ref 39–117)
ALT: 10 U/L (ref 0–35)
AST: 21 U/L (ref 0–37)
Albumin: 4.1 g/dL (ref 3.5–5.2)
Anion gap: 11 (ref 5–15)
BUN: 13 mg/dL (ref 6–23)
CALCIUM: 9.7 mg/dL (ref 8.4–10.5)
CHLORIDE: 102 meq/L (ref 96–112)
CO2: 27 mEq/L (ref 19–32)
Creatinine, Ser: 0.75 mg/dL (ref 0.50–1.10)
GFR calc Af Amer: >90 mL/min (ref >90)
GFR, EST NON AFRICAN AMERICAN: 83 mL/min — AB (ref >90)
GLUCOSE: 93 mg/dL (ref 70–99)
Potassium: 4.5 mEq/L (ref 3.7–5.3)
SODIUM: 140 meq/L (ref 137–147)
Total Bilirubin: 0.4 mg/dL (ref 0.3–1.2)
Total Protein: 7.5 g/dL (ref 6.0–8.3)

## 2013-12-17 LAB — CBC WITH DIFFERENTIAL/PLATELET
BASOS PCT: 1 % (ref 0–1)
Basophils Absolute: 0 10*3/uL (ref 0.0–0.1)
EOS ABS: 0 10*3/uL (ref 0.0–0.7)
Eosinophils Relative: 1 % (ref 0–5)
HEMATOCRIT: 39 % (ref 36.0–46.0)
Hemoglobin: 12.3 g/dL (ref 12.0–15.0)
LYMPHS PCT: 49 % — AB (ref 12–46)
Lymphs Abs: 1.8 10*3/uL (ref 0.7–4.0)
MCH: 26.6 pg (ref 26.0–34.0)
MCHC: 31.5 g/dL (ref 30.0–36.0)
MCV: 84.4 fL (ref 78.0–100.0)
MONO ABS: 0.2 10*3/uL (ref 0.1–1.0)
Monocytes Relative: 5 % (ref 3–12)
NEUTROS ABS: 1.6 10*3/uL — AB (ref 1.7–7.7)
Neutrophils Relative %: 44 % (ref 43–77)
Platelets: 232 10*3/uL (ref 150–400)
RBC: 4.62 MIL/uL (ref 3.87–5.11)
RDW: 24.8 % — ABNORMAL HIGH (ref 11.5–15.5)
WBC: 3.6 10*3/uL — ABNORMAL LOW (ref 4.0–10.5)

## 2013-12-17 NOTE — Progress Notes (Signed)
Monica [name] presented for labwork. Labs per MD order drawn via Peripheral Line 23 gauge needle inserted in right antecubital.  Good blood return present. Procedure without incident.  Needle removed intact. Patient tolerated procedure well.

## 2013-12-17 NOTE — Progress Notes (Signed)
[place] A. Barnet Glasgow, M.D.  NEW PATIENT EVALUATION   Name: Monica [name] Date: 12/17/2013 MRN: [id number] DOB: [birth year]  PCP: [name], MD   REFERRING PHYSICIAN: Rowe Clack, MD  REASON FOR REFERRAL: Iron deficiency     HISTORY OF PRESENT ILLNESS:Monica [name] is a 73 y.o. female who is referred by her family physician for evaluation of iron deficiency. She complains of fatigue and unexpected weight loss. Several members of her family were hospitalized over the previous 6 months and she was only eating 1 meal per day. Since stabilization of their maladies, she is gained back 5 pounds since 10/20/2013. She denies any nausea, vomiting, melena, hematochezia, or hematuria but does have instances of vaginal spotting. She denies any incontinence, lower extremity swelling or redness, PND, orthopnea, palpitations, cough, wheezing, skin rash, headache, or seizures. She does like to suck on ice chips.   PAST MEDICAL HISTORY:  has a past medical history of Hypertension; GERD (gastroesophageal reflux disease); Colon polyp (2011); Stricture and stenosis of esophagus (2006); Arthritis; OSTEOPENIA; Mixed hyperlipidemia; LOW BACK PAIN, CHRONIC; GLAUCOMA; DEPRESSION, SITUATIONAL; Chronic constipation; ANXIETY; and Diabetes mellitus (dx 02/2013).     PAST SURGICAL HISTORY: Past Surgical History  Procedure Laterality Date  . Tubal ligation    . Back surgery    . Hemorroidectomy    . Rotator cuff repair      right   . Hysteroscopy w/d&c  07/20/2011    Procedure: DILATATION AND CURETTAGE /HYSTEROSCOPY;  Surgeon: Melina Schools, MD;  Location: [facility];  Service: Gynecology;  Laterality: N/A;  . Polypectomy  07/20/2011    Procedure: POLYPECTOMY;  Surgeon: Melina Schools, MD;  Location: [facility];  Service: Gynecology;  Laterality: N/A;  . Esophageal manometry N/A 02/09/2013    Procedure: ESOPHAGEAL MANOMETRY (EM);  Surgeon: Sable Feil, MD;  Location: [facility];  Service: Endoscopy;  Laterality: N/A;     CURRENT MEDICATIONS: has a current medication list which includes the following prescription(s): amlodipine, aspirin, atorvastatin, one touch ultra 2, vitamin d3, dexlansoprazole, ferrous sulfate, glucose blood, metformin, onetouch delica lancets 49F, and polyethylene glycol powder.   ALLERGIES: Codeine and Hydrochlorothiazide   SOCIAL HISTORY:  reports that she has never smoked. She has never used smokeless tobacco. She reports that she does not drink alcohol or use illicit drugs.   FAMILY HISTORY: family history includes Breast cancer in her sister; Colon cancer (age of onset: 35) in her sister; Diabetes in her brother and mother; Heart failure in her father.    REVIEW OF SYSTEMS:  Other than that discussed above is noncontributory.    PHYSICAL EXAM:  height is 5\' 6"  (1.676 m) and weight is 138 lb 3.2 oz (62.687 kg). Her oral temperature is 97.7 F (36.5 C). Her blood pressure is 139/75 and her pulse is 74. Her respiration is 16.    GENERAL:alert, no distress and comfortable SKIN: skin color, texture, turgor are normal, no rashes or significant lesions EYES: normal, Conjunctiva are pink and non-injected, sclera clear OROPHARYNX:no exudate, no erythema and lips, buccal mucosa, and tongue normal  NECK: supple, thyroid normal size, non-tender, without nodularity CHEST: Normal AP diameter with no breast masses. LYMPH:  no palpable lymphadenopathy in the cervical, axillary or inguinal LUNGS: clear to auscultation and percussion with normal breathing effort HEART: regular rate & rhythm and no murmurs ABDOMEN:abdomen soft, non-tender and normal bowel sounds MUSCULOSKELETALl:no cyanosis of digits, no clubbing  or edema  NEURO: alert & oriented x 3 with fluent speech, no focal motor/sensory deficits    LABORATORY DATA:  Appointment on 12/09/2013  Component Date Value Ref Range Status  . Hemoglobin A1C  12/09/2013 6.0  4.6 - 6.5 % Final   Glycemic Control Guidelines for People with Diabetes:Non Diabetic:  <6%Goal of Therapy: <7%Additional Action Suggested:  >8%   . WBC 12/09/2013 3.3* 4.0 - 10.5 K/uL Final  . RBC 12/09/2013 4.76  3.87 - 5.11 Mil/uL Final  . Hemoglobin 12/09/2013 12.6  12.0 - 15.0 g/dL Final  . HCT 12/09/2013 39.1  36.0 - 46.0 % Final  . MCV 12/09/2013 82.1  78.0 - 100.0 fl Final  . MCHC 12/09/2013 32.2  30.0 - 36.0 g/dL Final  . RDW 12/09/2013 28.9* 11.5 - 15.5 % Final  . Platelets 12/09/2013 263.0  150.0 - 400.0 K/uL Final  . Neutrophils Relative % 12/09/2013 43.5  43.0 - 77.0 % Final  . Lymphocytes Relative 12/09/2013 48.4* 12.0 - 46.0 % Final  . Monocytes Relative 12/09/2013 7.1  3.0 - 12.0 % Final  . Eosinophils Relative 12/09/2013 0.4  0.0 - 5.0 % Final  . Basophils Relative 12/09/2013 0.6  0.0 - 3.0 % Final  . Neutro Abs 12/09/2013 1.4  1.4 - 7.7 K/uL Final  . Lymphs Abs 12/09/2013 1.6  0.7 - 4.0 K/uL Final  . Monocytes Absolute 12/09/2013 0.2  0.1 - 1.0 K/uL Final  . Eosinophils Absolute 12/09/2013 0.0  0.0 - 0.7 K/uL Final  . Basophils Absolute 12/09/2013 0.0  0.0 - 0.1 K/uL Final  . Ferritin 12/09/2013 12.1  10.0 - 291.0 ng/mL Final  . Cholesterol 12/09/2013 138  0 - 200 mg/dL Final   ATP III Classification       Desirable:  < 200 mg/dL               Borderline High:  200 - 239 mg/dL          High:  > = 240 mg/dL  . Triglycerides 12/09/2013 44.0  0.0 - 149.0 mg/dL Final   Normal:  <150 mg/dLBorderline High:  150 - 199 mg/dL  . HDL 12/09/2013 61.80  >39.00 mg/dL Final  . VLDL 12/09/2013 8.8  0.0 - 40.0 mg/dL Final  . LDL Cholesterol 12/09/2013 67  0 - 99 mg/dL Final  . Total CHOL/HDL Ratio 12/09/2013 2   Final                  Men          Women1/2 Average Risk     3.4          3.3Average Risk          5.0          4.42X Average Risk          9.6          7.13X Average Risk          15.0          11.0                      . NonHDL 12/09/2013 76.20   Final    NOTE:  Non-HDL goal should be 30 mg/dL higher than patient's LDL goal (i.e. LDL goal of < 70 mg/dL, would have non-HDL goal of < 100 mg/dL)    Urinalysis    Component Value Date/Time   COLORURINE YELLOW 07/20/2011 0615   APPEARANCEUR CLEAR 07/20/2011  0615   LABSPEC 1.015 07/20/2011 0615   PHURINE 6.0 07/20/2011 0615   GLUCOSEU NEGATIVE 07/20/2011 0615   HGBUR MODERATE* 07/20/2011 0615   HGBUR large 09/01/2007 0819   BILIRUBINUR NEGATIVE 07/20/2011 0615   KETONESUR NEGATIVE 07/20/2011 0615   PROTEINUR NEGATIVE 07/20/2011 0615   UROBILINOGEN 0.2 07/20/2011 0615   NITRITE NEGATIVE 07/20/2011 0615   LEUKOCYTESUR NEGATIVE 07/20/2011 0615      @RADIOGRAPHY : \ 09/10/2013 CLINICAL DATA: Unexplained weight loss in 73 year old female. EXAM: CT CHEST, ABDOMEN, AND PELVIS WITH CONTRAST TECHNIQUE: Multidetector CT imaging of the chest, abdomen and pelvis was performed following the standard protocol during bolus administration of intravenous contrast. CONTRAST: 179mL OMNIPAQUE IOHEXOL 300 MG/ML SOLN COMPARISON: Report from CT abdomen and pelvis 05/31/2011 FINDINGS: CT CHEST FINDINGS Imaged portion of the thyroid gland demonstrates a low-density 6.5 mm nodule in the right thyroid lobe. There are no prior chest CTs for comparison. Heart size is mildly enlarged. No visible coronary artery calcification. Thoracic aorta is normal in caliber and demonstrates mild noncalcified plaque in the proximal descending thoracic aorta. Esophagus appears within normal limits. Normal pleural. Negative for pleural effusion. Negative for lymphadenopathy in the supraclavicular, mediastinal, hilar, retrocrural, or axillary stations. The trachea and mainstem bronchi are patent. The lungs are well expanded and clear. No pulmonary mass or interstitial abnormality identified. No acute or suspicious bony abnormality. Thoracic spine vertebral bodies normal in height and alignment. CT ABDOMEN AND PELVIS FINDINGS Again noted is a large amount peripherally  enhancing lesion in the right hepatic lobe that measures approximately 7.8 x 7.1 x 7.3 cm. The peripheral enhancement is discontinuous and nodular. On delayed images, there is progressive but incomplete filling of the central portion of the mass. Findings are consistent with a benign hemangioma. This was previously measured to be 8.1 x 7.9 cm transverse dimension on the CT of 05/31/2011. Two additional but much smaller and similar appearing peripherally enhancing masses are noted. One is in the posterior right hepatic lobe measuring approximately 2 cm greatest diameter, an the other areas in the anterior right hepatic lobe measuring 1.8 cm greatest diameter. These are also consistent with benign hemangiomas member present on the prior CT of 2013. There also 2 circumscribed low-density lesions in the right hepatic lobe consistent with benign cysts. No suspicious hepatic lesion. The liver is normal in size. Negative for biliary ductal dilatation. The pancreas, adrenal glands, spleen, and kidneys are within normal limits. Both ureters are normal in caliber. Stomach is moderately distended with oral contrast and demonstrates normal wall thickness. Small bowel loops are normal in caliber and wall thickness. The appendix is normal. There is a moderate volume of stool in the colon. Colonic wall appears normal in thickness. Normal appearance of the urinary bladder. The uterus is retroverted and contains multiple rounded masses consistent with uterine fibroids. Some of these contain internal calcification. No adnexal mass is identified. Negative for mesenteric or retroperitoneal lymphadenopathy in the abdomen or pelvis. Negative for ascites. Peritoneal cavity and paraspinal soft tissues are unremarkable. Posterior disc bulge noted at L4-L5. Disc space narrowing at L5-S1. Vertebral bodies are normal in height and alignment. No acute or suspicious bony abnormality. IMPRESSION: 1. No explanation for patient weight loss is  identified in the chest, abdomen, or pelvis. Specifically, no suspicious mass lesion, lymphadenopathy, inflammatory changes, or bony lesion. 2. Benign hepatic hemangiomas as described above, without significant change since prior CT of 2013. 3. Multiple uterine fibroids. 4. Degenerative changes of the lower lumbar spine as described above.  5. 6.5 mm nonspecific right thyroid lobe nodule. 6. Mild cardiomegaly. Electronically Signed By: Curlene Dolphin M.D. On: 09/10/2013 13:29     US Soft Tissue Head/neck  12/14/2013   CLINICAL DATA:  evaluate thyroid nodule on CT chest 09/10/13  EXAM: THYROID ULTRASOUND  TECHNIQUE: Ultrasound examination of the thyroid gland and adjacent soft tissues was performed.  COMPARISON:  CT 09/10/2013  FINDINGS: Right thyroid lobe  Measurements: 50 x 10 x 15 mm. Several small cysts, largest 11 x 4 x 7 mm is mid lobe.  Left thyroid lobe  Measurements: 43 x 9 x 16 mm. Two small hypoechoic lesions, largest 6 x 4 mm in the upper pole.  Isthmus  Thickness:  9 mm.  No nodules visualized.  Lymphadenopathy  IMPRESSION: 1. Normal-sized thyroid with small bilateral cysts and nodules. Findings do not meet current consensus criteria for biopsy. Follow-up by clinical exam is recommended. If patient has known risk factors for thyroid carcinoma, consider follow-up ultrasound in 12 months. If patient is clinically hyperthyroid, consider nuclear medicine thyroid uptake and scan. This recommendation follows the consensus statement: Management of Thyroid Nodules Detected as [place]: Society of Radiologists in [place]. Radiology 2005; [id number].   Electronically Signed   By: Arne Cleveland M.D.   On: 12/14/2013 10:38    PATHOLOGY: Peripheral smear reveals normocytic normochromic cells with polychromasia.   IMPRESSION:  #1. 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 #2. Gastroesophageal  reflux disease with stricture, normal esophageal manometry, on long-term proton pump inhibitor therapy, status post dilatation. #3. Hypertension, controlled. #4. Irritable bowel syndrome with chronic constipation. #5. Diabetes mellitus, type II, non-insulin requiring. #6. Osteopenia. #7. Uterine fibroids. #8. 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 #9. Chronic back pain.   PLAN:  #1. IV iron on 12/24/2013 and 12/31/2013. #2. Followup in 6 weeks with CBC and ferritin.  I appreciate the opportunity sharing in her care.   Doroteo Bradford, MD 12/17/2013 4:42 PM   DISCLAIMER:  This note was dictated with voice recognition softwre.  Similar sounding words can inadvertently be transcribed inaccurately and may not be corrected upon review.

## 2013-12-17 NOTE — Patient Instructions (Signed)
[facility] Discharge Instructions  RECOMMENDATIONS MADE BY THE CONSULTANT AND ANY TEST RESULTS WILL BE SENT TO YOUR REFERRING PHYSICIAN.  EXAM FINDINGS BY THE PHYSICIAN TODAY AND SIGNS OR SYMPTOMS TO REPORT TO CLINIC OR PRIMARY PHYSICIAN: Exam and findings as discussed by Dr.Formanek.[name]  MEDICATIONS PRESCRIBED:  Schedule for iron infusion next Thursday and again 1 week later.  INSTRUCTIONS/FOLLOW-UP: Lab work today. Iron infusions weekly x 2. Return in 6 weeks for lab work prior to MD appointment.  Thank you for choosing [facility] to provide your oncology and hematology care.  To afford each patient quality time with our providers, please arrive at least 15 minutes before your scheduled appointment time.  With your help, our goal is to use those 15 minutes to complete the necessary work-up to ensure our physicians have the information they need to help with your evaluation and healthcare recommendations.    Effective January 1st, 2014, we ask that you re-schedule your appointment with our physicians should you arrive 10 or more minutes late for your appointment.  We strive to give you quality time with our providers, and arriving late affects you and other patients whose appointments are after yours.    Again, thank you for choosing [facility].  Our hope is that these requests will decrease the amount of time that you wait before being seen by our physicians.       _____________________________________________________________  Should you have questions after your visit to [facility], please contact our office at [phone] between the hours of 8:30 a.m. and 4:30 p.m.  Voicemails left after 4:30 p.m. will not be returned until the following business day.  For prescription refill requests, have your pharmacy contact our office with your prescription refill request.     _______________________________________________________________  We hope that we have given you very good care.  You may receive a patient satisfaction survey in the mail, please complete it and return it as soon as possible.  We value your feedback!  _______________________________________________________________  Have you asked about our STAR program?  STAR stands for Survivorship Training and Rehabilitation, and this is a nationally recognized cancer care program that focuses on survivorship and rehabilitation.  Cancer and cancer treatments may cause problems, such as, pain, making you feel tired and keeping you from doing the things that you need or want to do. Cancer rehabilitation can help. Our goal is to reduce these troubling effects and help you have the best quality of life possible.  You may receive a survey from a nurse that asks questions about your current state of health.  Based on the survey results, all eligible patients will be referred to the [facility] program for an evaluation so we can better serve you!  A frequently asked questions sheet is available upon request.

## 2013-12-19 LAB — INTRINSIC FACTOR ANTIBODIES: Intrinsic Factor: NEGATIVE

## 2013-12-21 LAB — ANTI-PARIETAL ANTIBODY: PARIETAL CELL ANTIBODY-IGG: NEGATIVE

## 2013-12-22 LAB — SOLUBLE TRANSFERRIN RECEPTOR: Transferrin Receptor, Soluble: 2.27 mg/L — ABNORMAL HIGH (ref 0.76–1.76)

## 2013-12-24 ENCOUNTER — Encounter (HOSPITAL_BASED_OUTPATIENT_CLINIC_OR_DEPARTMENT_OTHER): Payer: Medicare HMO

## 2013-12-24 VITALS — BP 107/60 | HR 70 | Temp 98.0°F | Resp 16

## 2013-12-24 DIAGNOSIS — D509 Iron deficiency anemia, unspecified: Secondary | ICD-10-CM

## 2013-12-24 MED ORDER — SODIUM CHLORIDE 0.9 % IV SOLN
Freq: Once | INTRAVENOUS | Status: AC
  Administered 2013-12-24: 11:00:00 via INTRAVENOUS

## 2013-12-24 MED ORDER — SODIUM CHLORIDE 0.9 % IV SOLN
510.0000 mg | Freq: Once | INTRAVENOUS | Status: AC
  Administered 2013-12-24: 510 mg via INTRAVENOUS
  Filled 2013-12-24: qty 17

## 2013-12-24 MED ORDER — HEPARIN SOD (PORK) LOCK FLUSH 100 UNIT/ML IV SOLN: 500.0000 [IU] | Freq: Once | INTRAVENOUS | Status: DC | PRN

## 2013-12-24 MED ORDER — SODIUM CHLORIDE 0.9 % IJ SOLN
10.0000 mL | INTRAMUSCULAR | Status: DC | PRN
  Filled 2013-12-24: qty 10

## 2013-12-24 NOTE — Progress Notes (Signed)
Tolerated well

## 2013-12-30 ENCOUNTER — Encounter (HOSPITAL_BASED_OUTPATIENT_CLINIC_OR_DEPARTMENT_OTHER): Payer: Medicare HMO

## 2013-12-30 VITALS — BP 102/61 | HR 75 | Temp 98.0°F | Resp 18 | Wt 138.6 lb

## 2013-12-30 DIAGNOSIS — D500 Iron deficiency anemia secondary to blood loss (chronic): Secondary | ICD-10-CM

## 2013-12-30 DIAGNOSIS — D509 Iron deficiency anemia, unspecified: Secondary | ICD-10-CM

## 2013-12-30 DIAGNOSIS — K909 Intestinal malabsorption, unspecified: Secondary | ICD-10-CM

## 2013-12-30 MED ORDER — SODIUM CHLORIDE 0.9 % IV SOLN
Freq: Once | INTRAVENOUS | Status: AC
  Administered 2013-12-30: 13:00:00 via INTRAVENOUS

## 2013-12-30 MED ORDER — SODIUM CHLORIDE 0.9 % IV SOLN
510.0000 mg | Freq: Once | INTRAVENOUS | Status: AC
  Administered 2013-12-30: 510 mg via INTRAVENOUS
  Filled 2013-12-30: qty 17

## 2013-12-30 NOTE — Progress Notes (Signed)
Pt tolerated infusion well.

## 2013-12-30 NOTE — Progress Notes (Signed)
The patient asked if she needed to cont. Her iron tablets at home.  Pa kefalas stated that she does not need to cont. Her iron po at time. rn attempted to call the patient.  Message left for her to return call to the clinic.

## 2013-12-31 ENCOUNTER — Encounter (HOSPITAL_COMMUNITY): Payer: Medicare HMO

## 2013-12-31 ENCOUNTER — Encounter (HOSPITAL_COMMUNITY): Payer: Commercial Managed Care - HMO

## 2014-01-01 ENCOUNTER — Ambulatory Visit (HOSPITAL_COMMUNITY): Payer: Commercial Managed Care - HMO

## 2014-01-27 ENCOUNTER — Encounter (HOSPITAL_COMMUNITY): Payer: Medicare HMO | Attending: Hematology and Oncology

## 2014-01-27 DIAGNOSIS — F411 Generalized anxiety disorder: Secondary | ICD-10-CM | POA: Diagnosis not present

## 2014-01-27 DIAGNOSIS — D509 Iron deficiency anemia, unspecified: Secondary | ICD-10-CM

## 2014-01-27 DIAGNOSIS — Z79899 Other long term (current) drug therapy: Secondary | ICD-10-CM | POA: Insufficient documentation

## 2014-01-27 DIAGNOSIS — E119 Type 2 diabetes mellitus without complications: Secondary | ICD-10-CM | POA: Diagnosis not present

## 2014-01-27 DIAGNOSIS — M949 Disorder of cartilage, unspecified: Secondary | ICD-10-CM | POA: Diagnosis not present

## 2014-01-27 DIAGNOSIS — Z888 Allergy status to other drugs, medicaments and biological substances status: Secondary | ICD-10-CM | POA: Insufficient documentation

## 2014-01-27 DIAGNOSIS — D126 Benign neoplasm of colon, unspecified: Secondary | ICD-10-CM | POA: Insufficient documentation

## 2014-01-27 DIAGNOSIS — D500 Iron deficiency anemia secondary to blood loss (chronic): Secondary | ICD-10-CM | POA: Diagnosis present

## 2014-01-27 DIAGNOSIS — R5383 Other fatigue: Secondary | ICD-10-CM

## 2014-01-27 DIAGNOSIS — K219 Gastro-esophageal reflux disease without esophagitis: Secondary | ICD-10-CM | POA: Insufficient documentation

## 2014-01-27 DIAGNOSIS — F329 Major depressive disorder, single episode, unspecified: Secondary | ICD-10-CM | POA: Diagnosis not present

## 2014-01-27 DIAGNOSIS — D259 Leiomyoma of uterus, unspecified: Secondary | ICD-10-CM | POA: Diagnosis not present

## 2014-01-27 DIAGNOSIS — M545 Low back pain, unspecified: Secondary | ICD-10-CM | POA: Diagnosis not present

## 2014-01-27 DIAGNOSIS — M899 Disorder of bone, unspecified: Secondary | ICD-10-CM | POA: Diagnosis not present

## 2014-01-27 DIAGNOSIS — K589 Irritable bowel syndrome without diarrhea: Secondary | ICD-10-CM | POA: Insufficient documentation

## 2014-01-27 DIAGNOSIS — E782 Mixed hyperlipidemia: Secondary | ICD-10-CM | POA: Diagnosis not present

## 2014-01-27 DIAGNOSIS — M129 Arthropathy, unspecified: Secondary | ICD-10-CM | POA: Diagnosis not present

## 2014-01-27 DIAGNOSIS — R634 Abnormal weight loss: Secondary | ICD-10-CM | POA: Insufficient documentation

## 2014-01-27 DIAGNOSIS — Z7982 Long term (current) use of aspirin: Secondary | ICD-10-CM | POA: Insufficient documentation

## 2014-01-27 DIAGNOSIS — Z885 Allergy status to narcotic agent status: Secondary | ICD-10-CM | POA: Diagnosis not present

## 2014-01-27 DIAGNOSIS — I10 Essential (primary) hypertension: Secondary | ICD-10-CM | POA: Diagnosis not present

## 2014-01-27 DIAGNOSIS — K9089 Other intestinal malabsorption: Secondary | ICD-10-CM | POA: Insufficient documentation

## 2014-01-27 DIAGNOSIS — K5900 Constipation, unspecified: Secondary | ICD-10-CM | POA: Insufficient documentation

## 2014-01-27 DIAGNOSIS — G8929 Other chronic pain: Secondary | ICD-10-CM | POA: Insufficient documentation

## 2014-01-27 DIAGNOSIS — R5381 Other malaise: Secondary | ICD-10-CM | POA: Diagnosis present

## 2014-01-27 DIAGNOSIS — F3289 Other specified depressive episodes: Secondary | ICD-10-CM | POA: Diagnosis not present

## 2014-01-27 DIAGNOSIS — H409 Unspecified glaucoma: Secondary | ICD-10-CM | POA: Diagnosis not present

## 2014-01-27 DIAGNOSIS — K222 Esophageal obstruction: Secondary | ICD-10-CM | POA: Diagnosis not present

## 2014-01-27 LAB — CBC WITH DIFFERENTIAL/PLATELET
BASOS PCT: 1 % (ref 0–1)
Basophils Absolute: 0 10*3/uL (ref 0.0–0.1)
EOS PCT: 0 % (ref 0–5)
Eosinophils Absolute: 0 10*3/uL (ref 0.0–0.7)
HCT: 40.2 % (ref 36.0–46.0)
Hemoglobin: 13.1 g/dL (ref 12.0–15.0)
LYMPHS ABS: 1.8 10*3/uL (ref 0.7–4.0)
Lymphocytes Relative: 51 % — ABNORMAL HIGH (ref 12–46)
MCH: 28.4 pg (ref 26.0–34.0)
MCHC: 32.6 g/dL (ref 30.0–36.0)
MCV: 87.2 fL (ref 78.0–100.0)
MONOS PCT: 6 % (ref 3–12)
Monocytes Absolute: 0.2 10*3/uL (ref 0.1–1.0)
Neutro Abs: 1.5 10*3/uL — ABNORMAL LOW (ref 1.7–7.7)
Neutrophils Relative %: 42 % — ABNORMAL LOW (ref 43–77)
Platelets: 246 10*3/uL (ref 150–400)
RBC: 4.61 MIL/uL (ref 3.87–5.11)
RDW: 20 % — ABNORMAL HIGH (ref 11.5–15.5)
WBC: 3.6 10*3/uL — ABNORMAL LOW (ref 4.0–10.5)

## 2014-01-27 NOTE — Progress Notes (Signed)
LABS FOR [facility],FERR  

## 2014-01-28 ENCOUNTER — Encounter (HOSPITAL_COMMUNITY): Payer: Self-pay

## 2014-01-28 ENCOUNTER — Encounter (HOSPITAL_BASED_OUTPATIENT_CLINIC_OR_DEPARTMENT_OTHER): Payer: Medicare HMO

## 2014-01-28 VITALS — BP 123/69 | HR 70 | Temp 98.2°F | Resp 18 | Wt 139.0 lb

## 2014-01-28 DIAGNOSIS — D509 Iron deficiency anemia, unspecified: Secondary | ICD-10-CM

## 2014-01-28 LAB — FERRITIN: Ferritin: 456 ng/mL — ABNORMAL HIGH (ref 10–291)

## 2014-01-28 MED ORDER — ESTROGENS, CONJUGATED 0.625 MG/GM VA CREA: TOPICAL_CREAM | VAGINAL | Status: DC

## 2014-01-28 NOTE — Progress Notes (Signed)
[place]  OFFICE PROGRESS NOTE  Monica Grant, MD [name]. [facility] [address] [place] [address]  DIAGNOSIS: Iron deficiency anemia, unspecified - Plan: CBC with Differential, Ferritin  Chief Complaint  Patient presents with  . Iron deficiency    CURRENT THERAPY: Feraheme 12/24/2013 and 12/31/2013.   INTERVAL HISTORY: Monica [name] 73 y.o. female returns for followup after receiving intravenous Feraheme on 12/24/2013 and 12/31/2013 for iron deficiency.  She does not feel much different after having intravenous iron. She denies any nausea, vomiting, diarrhea, constipation, melena, hematochezia, hematuria, epistaxis, or hemoptysis. She does suffer from vaginal spotting and did undergo D&C in March of 2013 with findings of endometrial polyps. It was suggested she undergo hysterectomy which she refused.  MEDICAL HISTORY: Past Medical History  Diagnosis Date  . Hypertension   . GERD (gastroesophageal reflux disease)     w/ HH (EGD 2010)  . 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 Stricture and stenosis of esophagus 2006  . Arthritis   . OSTEOPENIA   . Mixed hyperlipidemia   . LOW BACK PAIN, CHRONIC   . GLAUCOMA   . DEPRESSION, SITUATIONAL   . Chronic constipation   . ANXIETY   . Diabetes mellitus dx 02/2013    INTERIM HISTORY: has FIBROIDS, UTERUS; MIXED HYPERLIPIDEMIA; ANXIETY; DEPRESSION, SITUATIONAL; GLAUCOMA; ESSENTIAL HYPERTENSION; ESOPHAGEAL STRICTURE; HIATAL HERNIA; IRRITABLE BOWEL SYNDROME; DYSPAREUNIA; ARTHRITIS; LOW BACK PAIN, CHRONIC; OSTEOPENIA; COLONIC POLYPS, ADENOMATOUS, HX OF; Chronic constipation; GERD (gastroesophageal reflux disease); Frozen shoulder; DM type 2 (diabetes mellitus, type 2); Internal hemorrhoids; Loss of weight; and Iron deficiency anemia, unspecified on her problem list.    ALLERGIES:  is allergic to codeine and hydrochlorothiazide.  MEDICATIONS: has a current medication  list which includes the following prescription(s): amlodipine, aspirin, atorvastatin, one touch ultra 2, vitamin d3, dexlansoprazole, glucose blood, metformin, onetouch delica lancets 06C, polyethylene glycol powder, conjugated estrogens, and ferrous sulfate.  SURGICAL HISTORY:  Past Surgical History  Procedure Laterality Date  . Tubal ligation    . Back surgery    . Hemorroidectomy    . Rotator cuff repair      right   . Hysteroscopy w/d&c  07/20/2011    Procedure: DILATATION AND CURETTAGE /HYSTEROSCOPY;  Surgeon: Melina Schools, MD;  Location: [facility];  Service: Gynecology;  Laterality: N/A;  . Polypectomy  07/20/2011    Procedure: POLYPECTOMY;  Surgeon: Melina Schools, MD;  Location: [facility];  Service: Gynecology;  Laterality: N/A;  . Esophageal manometry N/A 02/09/2013    Procedure: ESOPHAGEAL MANOMETRY (EM);  Surgeon: Sable Feil, MD;  Location: [facility];  Service: Endoscopy;  Laterality: N/A;    FAMILY HISTORY: family history includes Breast cancer in her sister; Colon cancer (age of onset: 32) in her sister; Diabetes in her brother and mother; Heart failure in her father.  SOCIAL HISTORY:  reports that she has never smoked. She has never used smokeless tobacco. She reports that she does not drink alcohol or use illicit drugs.  REVIEW OF SYSTEMS:  Other than that discussed above is noncontributory.  PHYSICAL EXAMINATION: ECOG PERFORMANCE STATUS: 1 - Symptomatic but completely ambulatory  Blood pressure 123/69, pulse 70, temperature 98.2 F (36.8 C), temperature source Oral, resp. rate 18, weight 139 lb (63.05 kg), SpO2 99.00%.  GENERAL:alert, no distress and comfortable SKIN: skin color, texture, turgor are normal, no rashes or significant lesions EYES: PERLA; Conjunctiva are pink  and non-injected, sclera clear SINUSES: No redness or tenderness over maxillary or ethmoid sinuses OROPHARYNX:no exudate, no erythema on lips, buccal mucosa, or tongue. NECK: supple, thyroid  normal size, non-tender, without nodularity. No masses CHEST: Normal AP diameter with no breast masses. LYMPH:  no palpable lymphadenopathy in the cervical, axillary or inguinal LUNGS: clear to auscultation and percussion with normal breathing effort HEART: regular rate & rhythm and no murmurs. ABDOMEN:abdomen soft, non-tender and normal bowel sounds MUSCULOSKELETAL:no cyanosis of digits and no clubbing. Range of motion normal.  NEURO: alert & oriented x 3 with fluent speech, no focal motor/sensory deficits   LABORATORY DATA:   Results for Monica [name], Monica [name] (MRN [id number]) as of 01/28/2014 10:32  Ref. Range 12/09/2013 10:21 12/17/2013 14:44 01/27/2014 11:04  Ferritin Latest Range: 10-291 ng/mL 12.1  456 (H)    Lab on 01/27/2014  Component Date Value Ref Range Status  . WBC 01/27/2014 3.6* 4.0 - 10.5 K/uL Final  . RBC 01/27/2014 4.61  3.87 - 5.11 MIL/uL Final  . Hemoglobin 01/27/2014 13.1  12.0 - 15.0 g/dL Final  . HCT 01/27/2014 40.2  36.0 - 46.0 % Final  . MCV 01/27/2014 87.2  78.0 - 100.0 fL Final  . MCH 01/27/2014 28.4  26.0 - 34.0 pg Final  . MCHC 01/27/2014 32.6  30.0 - 36.0 g/dL Final  . RDW 01/27/2014 20.0* 11.5 - 15.5 % Final  . Platelets 01/27/2014 246  150 - 400 K/uL Final  . Neutrophils Relative % 01/27/2014 42* 43 - 77 % Final  . Neutro Abs 01/27/2014 1.5* 1.7 - 7.7 K/uL Final  . Lymphocytes Relative 01/27/2014 51* 12 - 46 % Final  . Lymphs Abs 01/27/2014 1.8  0.7 - 4.0 K/uL Final  . Monocytes Relative 01/27/2014 6  3 - 12 % Final  . Monocytes Absolute 01/27/2014 0.2  0.1 - 1.0 K/uL Final  . Eosinophils Relative 01/27/2014 0  0 - 5 % Final  . Eosinophils Absolute 01/27/2014 0.0  0.0 - 0.7 K/uL Final  . Basophils Relative 01/27/2014 1  0 - 1 % Final  . Basophils Absolute 01/27/2014 0.0  0.0 - 0.1 K/uL Final  . Ferritin 01/27/2014 456* 10 - 291 ng/mL Final   Performed at [facility]: No new pathology for Monica [name], Monica [name] ([id number]) Patient Name:  Monica [name], Monica [name] Accession #: [id number] DOB: Feb 26, 1941 Age: 85 Gender: F Client Name [place] Date: 07/20/2011 Received Date: 07/20/2011 Physician: Newton Pigg Chart #: MRN # : [id number] Physician cc: Race: B Visit #: [id number] REPORT OF SURGICAL PATHOLOGY FINAL DIAGNOSIS Diagnosis 1. Endocervical polyp BENIGN ENDOCERVICAL POLYP 2. Endocervix, curettage SCANT BENIGN STROMAL FRAGMENT. SEE MICROSCOPIC DESCRIPTION 3. Endometrium, curettage BLOOD AND SCANT BENIGN GLANDULAR FRAGMENTS. SEE MICROSCOPIC DESCRIPTION. 4. Endometrial polyp FRAGMENTS OF BENIGN ENDOMETRIAL POLYP. NO HYPERPLASIA OR CARCINOMA. Microscopic Comment 2. The specimen is processed as a cell block and shows a rare minute benign stromal fragment with no endocervical glandular tissue identified. 3. The endometrial curettings specimen consist predominantly of blood with a few microscopic benign glandular fragments consistent with endometrial origin. The amount of endometrial glandular tissue present is limited. (JDP:kh 07-23-11) Claudette Laws MD Pathologist, Electronic Signature (Case signed 07/23/2011) Specimen Gross and Clinical Information Specimen(s) Obtained: 1. Endocervical polyp 2. Endocervix, curettage 3. Endometrium, curettage 4. Endometrial polyp Specimen Clinical Information 4. Fibroids. (lw) Gross 1. Received in formalin is a soft, pink tan, polypoid structure, 1.2 x 0.7 x 0.4 cm. It is sectioned and entirely submitted in  one block. 1 of 2 FINAL for Monica [name], Monica [name] ([id number]) Gross(continued) 2. Received in formalin is a blood stained gauze pad without grossly identifiable tissue. The specimen is sent to Cytology for cell block preparation. One block is submitted. 3. Received in formalin is a 0.5 x 0.3 x 0.2 cm portion of soft, hemorrhagic tissue which is entirely submitted in one block. 4. Received in formalin is a 5.3 x 4.6 x 1.2 cm aggregate of soft to rubbery, pink tan,  edematous tissue. Sectioning the larger fragments reveals cystic pockets up to 0.6 cm in diameter. The specimen is entirely submitted in six blocks. Block Summary: A and B = one piece, sectioned. C and D = larger pieces, entirely submitted. E and F = remaining small fragments. (TB:eps 07/20/11) Report signed out from the following location(s) [place] [place] [place], [place], [place] [address]. CLIA #: [id number], [address], [place], [place] [address]. CLIA #: [id number], Urinalysis    Component Value Date/Time   COLORURINE YELLOW 07/20/2011 0615   APPEARANCEUR CLEAR 07/20/2011 0615   LABSPEC 1.015 07/20/2011 0615   PHURINE 6.0 07/20/2011 0615   GLUCOSEU NEGATIVE 07/20/2011 0615   HGBUR MODERATE* 07/20/2011 0615   HGBUR large 09/01/2007 0819   BILIRUBINUR NEGATIVE 07/20/2011 0615   KETONESUR NEGATIVE 07/20/2011 0615   PROTEINUR NEGATIVE 07/20/2011 0615   UROBILINOGEN 0.2 07/20/2011 0615   NITRITE NEGATIVE 07/20/2011 0615   LEUKOCYTESUR NEGATIVE 07/20/2011 0615    RADIOGRAPHIC STUDIES: No results found.  ASSESSMENT:  #1. 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  #2. Gastroesophageal reflux disease with stricture, normal esophageal manometry, on long-term proton pump inhibitor therapy, status post dilatation.  #3. Hypertension, controlled.  #4. Irritable bowel syndrome with chronic constipation.  #5. Diabetes mellitus, type II, non-insulin requiring.  #6. Osteopenia.  #7. Uterine fibroids.  #8. 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  #9. Chronic back pain. 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 History of endometrial polyps with vaginal spotting now, probably atrophic vaginitis.    PLAN:  #1. Premarin vaginal cream twice a week. #2. Stop oral iron supplements. #3. Followup in 6 months with CBC and ferritin.   All questions were answered. The patient knows to  call the clinic with any problems, questions or concerns. We can certainly see the patient much sooner if necessary.   I spent 25 minutes counseling the patient face to face. The total time spent in the appointment was 30 minutes.    Doroteo Bradford, MD 01/28/2014 11:36 AM  DISCLAIMER:  This note was dictated with voice recognition software.  Similar sounding words can inadvertently be transcribed inaccurately and may not be corrected upon review.

## 2014-01-28 NOTE — Patient Instructions (Signed)
Monica [name] Discharge Instructions  RECOMMENDATIONS MADE BY THE CONSULTANT AND ANY TEST RESULTS WILL BE SENT TO YOUR REFERRING PHYSICIAN.  EXAM FINDINGS BY THE PHYSICIAN TODAY AND SIGNS OR SYMPTOMS TO REPORT TO CLINIC OR PRIMARY PHYSICIAN: Exam and findings as discussed by Dr. Barnet Glasgow.  MEDICATIONS PRESCRIBED:   Premarin cream (apply twice a week).  INSTRUCTIONS/FOLLOW-UP:  Please call the clinic in one month to let us know if the Premarin cream helped resolve your vaginal spotting. Return in six months for an office visit and blood work.  Thank you for choosing [facility] to provide your oncology and hematology care.  To afford each patient quality time with our providers, please arrive at least 15 minutes before your scheduled appointment time.  With your help, our goal is to use those 15 minutes to complete the necessary work-up to ensure our physicians have the information they need to help with your evaluation and healthcare recommendations.    Effective January 1st, 2014, we ask that you re-schedule your appointment with our physicians should you arrive 10 or more minutes late for your appointment.  We strive to give you quality time with our providers, and arriving late affects you and other patients whose appointments are after yours.    Again, thank you for choosing [facility].  Our hope is that these requests will decrease the amount of time that you wait before being seen by our physicians.       _____________________________________________________________  Should you have questions after your visit to [facility], please contact our office at [phone] between the hours of 8:30 a.m. and 4:30 p.m.  Voicemails left after 4:30 p.m. will not be returned until the following business day.  For prescription refill requests, have your pharmacy contact our office with your prescription refill request.     _______________________________________________________________  We hope that we have given you very good care.  You may receive a patient satisfaction survey in the mail, please complete it and return it as soon as possible.  We value your feedback!  _______________________________________________________________  Have you asked about our STAR program?  STAR stands for Survivorship Training and Rehabilitation, and this is a nationally recognized cancer care program that focuses on survivorship and rehabilitation.  Cancer and cancer treatments may cause problems, such as, pain, making you feel tired and keeping you from doing the things that you need or want to do. Cancer rehabilitation can help. Our goal is to reduce these troubling effects and help you have the best quality of life possible.  You may receive a survey from a nurse that asks questions about your current state of health.  Based on the survey results, all eligible patients will be referred to the [facility] program for an evaluation so we can better serve you!  A frequently asked questions sheet is available upon request.

## 2014-02-05 ENCOUNTER — Encounter (HOSPITAL_COMMUNITY): Payer: Self-pay | Admitting: Hematology and Oncology

## 2014-02-05 DIAGNOSIS — D500 Iron deficiency anemia secondary to blood loss (chronic): Secondary | ICD-10-CM | POA: Insufficient documentation

## 2014-02-05 HISTORY — DX: Iron deficiency anemia secondary to blood loss (chronic): D50.0

## 2014-03-11 ENCOUNTER — Telehealth: Payer: Self-pay | Admitting: Internal Medicine

## 2014-03-11 NOTE — Telephone Encounter (Signed)
Patient Information:  Caller Name: Rilda  Phone: [phone]  Patient: Monica [name], Monica [name]  Gender: Female  DOB: 24-Jan-1941  Age: 73 Years  PCP: [name] (Adults only)  Office Follow Up:  Does the office need to follow up with this patient?: Yes  Instructions For The Office: Patient would like to schedule an appt for next week- week of November 2nd.  Please contact for appt scheduling  RN Note:  Patient would like to schedule an appt for next week- week of November 2nd.  Please contact for appt scheduling  Symptoms  Reason For Call & Symptoms: She states when she gets up in the morning she is sore.  Location- "whole body more on Left side. Neck to waist line.  She states under her chin , left side , she feels something.  No pain with swallowing.     After she takes a warm bath it goes away.  Why is she sore?   No injury. No bruising. Onset - 1 week.  Reviewed Health History In EMR: Yes  Reviewed Medications In EMR: Yes  Reviewed Allergies In EMR: Yes  Reviewed Surgeries / Procedures: Yes  Date of Onset of Symptoms: 03/04/2014  Treatments Tried: warm bath  Treatments Tried Worked: Yes  Guideline(s) Used:  Neurologic Deficit  Neck Pain or Stiffness  Disposition Per Guideline:   See Within 2 Weeks in Office  Reason For Disposition Reached:   Age > 16 and no prior history of similar neck pain  Advice Given:  Sleep:  Sleep on your back or side, not on your abdomen.  Apply Cold to the Area Or Heat:   During the first 2 days after a mild injury, apply a cold pack or an ice bag (wrapped in a towel) for 20 minutes four times a day. After 2 days, apply a heating pad or hot water bottle to the most painful area for 20 minutes whenever the pain flares up. Wrap hot water bottles or heating pads in a towel to avoid burns.  Pain Medicines:  For pain relief, you can take either acetaminophen, ibuprofen, or naproxen.  They are over-the-counter (OTC) pain drugs. You can buy them at the  drugstore.  Call Back If:  Numbness or weakness occurs  Bowel or bladder problems occur  Pain lasts for more than 2 weeks  You become worse.  Patient Will Follow Care Advice:  YES

## 2014-03-11 NOTE — Telephone Encounter (Signed)
Forwarding msg to schedulers...Johny Chess

## 2014-03-11 NOTE — Telephone Encounter (Signed)
Got scheduled with Dr. Linna Darner on 11/03 at 8:30am.

## 2014-03-15 ENCOUNTER — Ambulatory Visit (INDEPENDENT_AMBULATORY_CARE_PROVIDER_SITE_OTHER): Payer: Commercial Managed Care - HMO | Admitting: Family

## 2014-03-15 ENCOUNTER — Encounter: Payer: Self-pay | Admitting: Family

## 2014-03-15 VITALS — BP 130/82 | HR 72 | Temp 98.1°F | Resp 16 | Ht 66.0 in | Wt 141.1 lb

## 2014-03-15 DIAGNOSIS — M129 Arthropathy, unspecified: Secondary | ICD-10-CM

## 2014-03-15 NOTE — Patient Instructions (Signed)
Thank you for choosing [facility].  Summary/Instructions:   Your pains appear to be coming from arthritis in both your thumbs and your left shoulder.  Take 1-2 ibuprofen as needed for discomfort.   Aspircream, icy/hot or other the heating cream.  Warm compresses as needed.  Follow up if symptoms worsen or fail to improve  Osteoarthritis Osteoarthritis is a disease that causes soreness and inflammation of a joint. It occurs when the cartilage at the affected joint wears down. Cartilage acts as a cushion, covering the ends of bones where they meet to form a joint. Osteoarthritis is the most common form of arthritis. It often occurs in older people. The joints affected most often by this condition include those in the:  Ends of the fingers.  Thumbs.  Neck.  Lower back.  Knees.  Hips. CAUSES  Over time, the cartilage that covers the ends of bones begins to wear away. This causes bone to rub on bone, producing pain and stiffness in the affected joints.  RISK FACTORS Certain factors can increase your chances of having osteoarthritis, including:  Older age.  Excessive body weight.  Overuse of joints.  Previous joint injury. SIGNS AND SYMPTOMS   Pain, swelling, and stiffness in the joint.  Over time, the joint may lose its normal shape.  Small deposits of bone (osteophytes) may grow on the edges of the joint.  Bits of bone or cartilage can break off and float inside the joint space. This may cause more pain and damage. DIAGNOSIS  Your health care provider will do a physical exam and ask about your symptoms. Various tests may be ordered, such as:  X-rays of the affected joint.  An MRI scan.  Blood tests to rule out other types of arthritis.  Joint fluid tests. This involves using a needle to draw fluid from the joint and examining the fluid under a microscope. TREATMENT  Goals of treatment are to control pain and improve joint function. Treatment plans may  include:  A prescribed exercise program that allows for rest and joint relief.  A weight control plan.  Pain relief techniques, such as:  Properly applied heat and cold.  Electric pulses delivered to nerve endings under the skin (transcutaneous electrical nerve stimulation [TENS]).  Massage.  Certain nutritional supplements.  Medicines to control pain, such as:  Acetaminophen.  Nonsteroidal anti-inflammatory drugs (NSAIDs), such as ibuprofen.  Narcotic or central-acting agents, such as tramadol.  Corticosteroids. These can be given orally or as an injection.  Surgery to reposition the bones and relieve pain (osteotomy) or to remove loose pieces of bone and cartilage. Joint replacement may be needed in advanced states of osteoarthritis. HOME CARE INSTRUCTIONS   Take medicines only as directed by your health care provider.  Maintain a healthy weight. Follow your health care provider's instructions for weight control. This may include dietary instructions.  Exercise as directed. Your health care provider can recommend specific types of exercise. These may include:  Strengthening exercises. These are done to strengthen the muscles that support joints affected by arthritis. They can be performed with weights or with exercise bands to add resistance.  Aerobic activities. These are exercises, such as brisk walking or low-impact aerobics, that get your heart pumping.  Range-of-motion activities. These keep your joints limber.  Balance and agility exercises. These help you maintain daily living skills.  Rest your affected joints as directed by your health care provider.  Keep all follow-up visits as directed by your health care provider. SEEK  MEDICAL CARE IF:   Your skin turns red.  You develop a rash in addition to your joint pain.  You have worsening joint pain.  You have a fever along with joint or muscle aches. SEEK IMMEDIATE MEDICAL CARE IF:  You have a  significant loss of weight or appetite.  You have night sweats. [employer] of Arthritis and Musculoskeletal and Skin Diseases: www.niams.SouthExposed.es  [employer] on Aging: http://kim-miller.com/  American College of Rheumatology: www.rheumatology.org Document Released: 04/30/2005 Document Revised: 09/14/2013 Document Reviewed: 01/05/2013 [facility] Patient Information 2015 [place], [place]. This information is not intended to replace advice given to you by your health care provider. Make sure you discuss any questions you have with your health care provider.

## 2014-03-15 NOTE — Progress Notes (Signed)
Pre visit review using our clinic review tool, if applicable. No additional management support is needed unless otherwise documented below in the visit note. 

## 2014-03-15 NOTE — Assessment & Plan Note (Signed)
Symptoms consistent with osteoarthritis. Start OTC ibuprofen 1-2 tablets as needed for pain and soreness. Continue to apply heat packs/ice packs as needed. May use Tylenol for pain. Continue over the counter arthritis creams as needed. Follow up if symptoms worsen or fail to improve.

## 2014-03-15 NOTE — Progress Notes (Signed)
Subjective:    Patient ID: CHRISTOL THETFORD, female    DOB: 01/09/1941, 73 y.o.   MRN: [id number]  Chief Complaint  Patient presents with  . Soreness    Left side soreness when waking up in the morning, and also hand pain in both hands on and off for a month    HPI:   ZANIAH TITTERINGTON is a 73 y.o. female who presents today for an acute visit.   Acute soreness on the left side mainly in her left shoulder and upper extremity for about a couple of weeks. It is usually in the morning and goes away after she has been moving. Movement is the only thing that makes her shoulder better, it appears as though cold weather makes her shoulder more sore.   Also has pain in her hands which is off and on and states it depends on how she uses them. Has soaked them in "arthritis alcohol" which seems to help some but not a lot. Her hands have increased soreness after using them for the day and when operating the remote control. , The only treatment that seems to help is the "arthritis alcohol."    Did have a fall several months ago, but states this is unrelated to it.   Allergies  Allergen Reactions  . Codeine     REACTION: Upset stomach  . Hydrochlorothiazide     REACTION: Nausea and headache    Current Outpatient Prescriptions on File Prior to Visit  Medication Sig Dispense Refill  . amLODipine (NORVASC) 5 MG tablet Take 1 tablet (5 mg total) by mouth daily. 90 tablet 3  . aspirin 81 MG tablet Take 81 mg by mouth daily.      [name] atorvastatin (LIPITOR) 10 MG tablet TAKE ONE TABLET BY MOUTH ONCE DAILY 90 tablet 1  . Blood Glucose Monitoring Suppl (ONE TOUCH ULTRA 2) W/DEVICE KIT Use to check blood sugar every morning 1 each 0  . Cholecalciferol (VITAMIN D3) 1000 UNITS CAPS Take 1,000 Units by mouth daily.    [name] conjugated estrogens (PREMARIN) vaginal cream One applicator full placed in the vagina twice a week. 42.5 g 12  . dexlansoprazole (DEXILANT) 60 MG capsule Take 1 capsule (60 mg total) by mouth  daily. 90 capsule 3  . ferrous sulfate 325 (65 FE) MG tablet Take 1 tablet (325 mg total) by mouth daily with breakfast. 30 tablet 3  . glucose blood (ONE TOUCH ULTRA TEST) test strip Use to check blood sugar every morning before breakfast 100 each 3  . metFORMIN (GLUCOPHAGE) 500 MG tablet TAKE ONE TABLET BY MOUTH DAILY WITH BREAKFAST 90 tablet 1  . ONETOUCH DELICA LANCETS 25O MISC USE ONE LANCET TO CHECK BLOOD SUGAR EVERY MORNING BEFORE BREAKFAST 100 each 5  . polyethylene glycol powder (GLYCOLAX/MIRALAX) powder Take 17 g by mouth daily. 3350 g 1   No current facility-administered medications on file prior to visit.    Past Medical History  Diagnosis Date  . Hypertension   . GERD (gastroesophageal reflux disease)     w/ HH (EGD 2010)  . 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 Stricture and stenosis of esophagus 2006  . Arthritis   . OSTEOPENIA   . Mixed hyperlipidemia   . LOW BACK PAIN, CHRONIC   . GLAUCOMA   . DEPRESSION, SITUATIONAL   . Chronic constipation   . ANXIETY   . Diabetes mellitus dx 02/2013    Review of Systems  See HPI   Objective:    BP 130/82 mmHg  Pulse 72  Temp(Src) 98.1 F (36.7 C) (Oral)  Resp 16  Ht $R'5\' 6"'WV$  (1.676 m)  Wt 141 lb 1.9 oz (64.012 kg)  BMI 22.79 kg/m2  SpO2 96% Nursing note and vital signs reviewed.  Physical Exam  Constitutional: She is oriented to person, place, and time. She appears well-developed and well-nourished.  Cardiovascular: Normal rate, regular rhythm and normal heart sounds.   Pulmonary/Chest: Effort normal and breath sounds normal.  Musculoskeletal:  No obvious deformity, discoloration or edema of left shoulder. No cervical tenderness. Unable to elicit any tenderness. Full shoulder ROM with 4+ strength throughout.   Bilateral thumbs have enlarged metacarpal phalangeal joints. Mild tenderness of bilateral thumbs noted. Thumb ROM intact and appropriate.   Neurological: She is alert and oriented to person, place,  and time.  Skin: Skin is warm and dry.  Psychiatric: She has a normal mood and affect. Her behavior is normal. Judgment and thought content normal.       Assessment & Plan:

## 2014-03-16 ENCOUNTER — Ambulatory Visit: Payer: Commercial Managed Care - HMO | Admitting: Internal Medicine

## 2014-04-28 ENCOUNTER — Other Ambulatory Visit: Payer: Self-pay | Admitting: Internal Medicine

## 2014-05-10 ENCOUNTER — Encounter (HOSPITAL_COMMUNITY): Payer: Self-pay

## 2014-05-10 ENCOUNTER — Emergency Department (HOSPITAL_COMMUNITY)
Admission: EM | Admit: 2014-05-10 | Discharge: 2014-05-10 | Disposition: A | Discharge status: Home / Self Care | Payer: Commercial Managed Care - HMO | Attending: Emergency Medicine | Admitting: Emergency Medicine

## 2014-05-10 ENCOUNTER — Emergency Department (HOSPITAL_COMMUNITY): Payer: Commercial Managed Care - HMO

## 2014-05-10 DIAGNOSIS — S199XXA Unspecified injury of neck, initial encounter: Secondary | ICD-10-CM | POA: Diagnosis present

## 2014-05-10 DIAGNOSIS — S0990XA Unspecified injury of head, initial encounter: Secondary | ICD-10-CM | POA: Insufficient documentation

## 2014-05-10 DIAGNOSIS — Y92410 Unspecified street and highway as the place of occurrence of the external cause: Secondary | ICD-10-CM | POA: Insufficient documentation

## 2014-05-10 DIAGNOSIS — Z8659 Personal history of other mental and behavioral disorders: Secondary | ICD-10-CM | POA: Diagnosis not present

## 2014-05-10 DIAGNOSIS — M899 Disorder of bone, unspecified: Secondary | ICD-10-CM | POA: Diagnosis not present

## 2014-05-10 DIAGNOSIS — R519 Headache, unspecified: Secondary | ICD-10-CM

## 2014-05-10 DIAGNOSIS — Z79899 Other long term (current) drug therapy: Secondary | ICD-10-CM | POA: Insufficient documentation

## 2014-05-10 DIAGNOSIS — K219 Gastro-esophageal reflux disease without esophagitis: Secondary | ICD-10-CM | POA: Diagnosis not present

## 2014-05-10 DIAGNOSIS — Y9389 Activity, other specified: Secondary | ICD-10-CM | POA: Insufficient documentation

## 2014-05-10 DIAGNOSIS — G8929 Other chronic pain: Secondary | ICD-10-CM | POA: Diagnosis not present

## 2014-05-10 DIAGNOSIS — I10 Essential (primary) hypertension: Secondary | ICD-10-CM | POA: Insufficient documentation

## 2014-05-10 DIAGNOSIS — K5900 Constipation, unspecified: Secondary | ICD-10-CM | POA: Insufficient documentation

## 2014-05-10 DIAGNOSIS — E782 Mixed hyperlipidemia: Secondary | ICD-10-CM | POA: Diagnosis not present

## 2014-05-10 DIAGNOSIS — S3992XA Unspecified injury of lower back, initial encounter: Secondary | ICD-10-CM | POA: Diagnosis not present

## 2014-05-10 DIAGNOSIS — Y998 Other external cause status: Secondary | ICD-10-CM | POA: Diagnosis not present

## 2014-05-10 DIAGNOSIS — M1990 Unspecified osteoarthritis, unspecified site: Secondary | ICD-10-CM | POA: Insufficient documentation

## 2014-05-10 DIAGNOSIS — E119 Type 2 diabetes mellitus without complications: Secondary | ICD-10-CM | POA: Diagnosis not present

## 2014-05-10 DIAGNOSIS — Z7982 Long term (current) use of aspirin: Secondary | ICD-10-CM | POA: Diagnosis not present

## 2014-05-10 DIAGNOSIS — S161XXA Strain of muscle, fascia and tendon at neck level, initial encounter: Secondary | ICD-10-CM

## 2014-05-10 DIAGNOSIS — Z86010 Personal history of colonic polyps: Secondary | ICD-10-CM | POA: Diagnosis not present

## 2014-05-10 DIAGNOSIS — R51 Headache: Secondary | ICD-10-CM

## 2014-05-10 MED ORDER — TRAMADOL HCL 50 MG PO TABS: 50.0000 mg | ORAL_TABLET | Freq: Four times a day (QID) | ORAL | Status: DC | PRN

## 2014-05-10 MED ORDER — LORAZEPAM 0.5 MG PO TABS
0.5000 mg | ORAL_TABLET | Freq: Once | ORAL | Status: AC
  Administered 2014-05-10: 0.5 mg via ORAL
  Filled 2014-05-10: qty 1

## 2014-05-10 MED ORDER — OXYCODONE-ACETAMINOPHEN 5-325 MG PO TABS
1.0000 | ORAL_TABLET | Freq: Once | ORAL | Status: AC
  Administered 2014-05-10: 1 via ORAL
  Filled 2014-05-10: qty 1
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
## 2014-05-10 NOTE — ED Provider Notes (Signed)
10:38 PM Family member calling after DC requesting prescription for pain medication. Was written for tramadol. They report that no script with paperwork. Pt/family in [place]. Discussed that controlled medications cannot be called into pharmacy. Explained that I would be working at [employer] at [employer] and they could come by then or I could call in alternative.  Virgel Manifold, MD 05/10/14 2240

## 2014-05-10 NOTE — ED Notes (Signed)
MD at bedside. 

## 2014-05-10 NOTE — ED Notes (Signed)
Per registration pt now reporting headache,nausea.Pt acuity adjusted to reflect new complaint. nad noted.

## 2014-05-10 NOTE — ED Provider Notes (Signed)
11:11 PM Script for tramadol called to [facility] at [address] in [place] per daughter request.     Virgel Manifold, MD 05/10/14 2312

## 2014-05-15 DIAGNOSIS — Y998 Other external cause status: Secondary | ICD-10-CM | POA: Diagnosis not present

## 2014-05-15 DIAGNOSIS — I10 Essential (primary) hypertension: Secondary | ICD-10-CM | POA: Diagnosis not present

## 2014-05-15 DIAGNOSIS — S161XXA Strain of muscle, fascia and tendon at neck level, initial encounter: Secondary | ICD-10-CM | POA: Diagnosis not present

## 2014-05-15 DIAGNOSIS — M542 Cervicalgia: Secondary | ICD-10-CM | POA: Diagnosis not present

## 2014-05-15 DIAGNOSIS — Z79899 Other long term (current) drug therapy: Secondary | ICD-10-CM | POA: Diagnosis not present

## 2014-05-15 DIAGNOSIS — G44309 Post-traumatic headache, unspecified, not intractable: Secondary | ICD-10-CM | POA: Diagnosis not present

## 2014-05-15 DIAGNOSIS — Z885 Allergy status to narcotic agent status: Secondary | ICD-10-CM | POA: Diagnosis not present

## 2014-05-15 DIAGNOSIS — F0781 Postconcussional syndrome: Secondary | ICD-10-CM | POA: Diagnosis not present

## 2014-05-15 DIAGNOSIS — Z7982 Long term (current) use of aspirin: Secondary | ICD-10-CM | POA: Diagnosis not present

## 2014-05-15 DIAGNOSIS — R51 Headache: Secondary | ICD-10-CM | POA: Diagnosis not present

## 2014-05-15 DIAGNOSIS — Z888 Allergy status to other drugs, medicaments and biological substances status: Secondary | ICD-10-CM | POA: Diagnosis not present

## 2014-05-16 DIAGNOSIS — Z79899 Other long term (current) drug therapy: Secondary | ICD-10-CM | POA: Diagnosis not present

## 2014-05-16 DIAGNOSIS — F0781 Postconcussional syndrome: Secondary | ICD-10-CM | POA: Diagnosis not present

## 2014-05-16 DIAGNOSIS — R51 Headache: Secondary | ICD-10-CM | POA: Diagnosis not present

## 2014-05-16 DIAGNOSIS — Z7982 Long term (current) use of aspirin: Secondary | ICD-10-CM | POA: Diagnosis not present

## 2014-05-16 DIAGNOSIS — Z885 Allergy status to narcotic agent status: Secondary | ICD-10-CM | POA: Diagnosis not present

## 2014-05-16 DIAGNOSIS — S161XXA Strain of muscle, fascia and tendon at neck level, initial encounter: Secondary | ICD-10-CM | POA: Diagnosis not present

## 2014-05-16 DIAGNOSIS — M542 Cervicalgia: Secondary | ICD-10-CM | POA: Diagnosis not present

## 2014-05-16 DIAGNOSIS — Z888 Allergy status to other drugs, medicaments and biological substances status: Secondary | ICD-10-CM | POA: Diagnosis not present

## 2014-05-16 DIAGNOSIS — Y998 Other external cause status: Secondary | ICD-10-CM | POA: Diagnosis not present

## 2014-05-16 DIAGNOSIS — G44309 Post-traumatic headache, unspecified, not intractable: Secondary | ICD-10-CM | POA: Diagnosis not present

## 2014-05-16 DIAGNOSIS — I10 Essential (primary) hypertension: Secondary | ICD-10-CM | POA: Diagnosis not present

## 2014-05-17 DIAGNOSIS — Z1231 Encounter for screening mammogram for malignant neoplasm of breast: Secondary | ICD-10-CM | POA: Diagnosis not present

## 2014-05-17 DIAGNOSIS — Z803 Family history of malignant neoplasm of breast: Secondary | ICD-10-CM | POA: Diagnosis not present

## 2014-05-19 ENCOUNTER — Encounter: Payer: Self-pay | Admitting: Internal Medicine

## 2014-05-19 ENCOUNTER — Ambulatory Visit (INDEPENDENT_AMBULATORY_CARE_PROVIDER_SITE_OTHER): Payer: Commercial Managed Care - HMO | Admitting: Internal Medicine

## 2014-05-19 VITALS — BP 120/74 | HR 84 | Temp 99.4°F | Ht 66.0 in | Wt 138.1 lb

## 2014-05-19 DIAGNOSIS — Z0000 Encounter for general adult medical examination without abnormal findings: Secondary | ICD-10-CM | POA: Diagnosis not present

## 2014-05-19 DIAGNOSIS — M545 Low back pain, unspecified: Secondary | ICD-10-CM

## 2014-05-19 DIAGNOSIS — Z01419 Encounter for gynecological examination (general) (routine) without abnormal findings: Secondary | ICD-10-CM | POA: Diagnosis not present

## 2014-05-19 DIAGNOSIS — Z124 Encounter for screening for malignant neoplasm of cervix: Secondary | ICD-10-CM | POA: Diagnosis not present

## 2014-05-19 DIAGNOSIS — I10 Essential (primary) hypertension: Secondary | ICD-10-CM

## 2014-05-19 DIAGNOSIS — D259 Leiomyoma of uterus, unspecified: Secondary | ICD-10-CM | POA: Diagnosis not present

## 2014-05-19 DIAGNOSIS — N859 Noninflammatory disorder of uterus, unspecified: Secondary | ICD-10-CM | POA: Diagnosis not present

## 2014-05-19 DIAGNOSIS — R42 Dizziness and giddiness: Secondary | ICD-10-CM | POA: Diagnosis not present

## 2014-05-19 DIAGNOSIS — N950 Postmenopausal bleeding: Secondary | ICD-10-CM | POA: Diagnosis not present

## 2014-05-19 DIAGNOSIS — R319 Hematuria, unspecified: Secondary | ICD-10-CM | POA: Diagnosis not present

## 2014-05-19 MED ORDER — HYDROCODONE-ACETAMINOPHEN 5-325 MG PO TABS: 1.0000 | ORAL_TABLET | Freq: Four times a day (QID) | ORAL | Status: DC | PRN

## 2014-05-19 MED ORDER — TIZANIDINE HCL 4 MG PO TABS: 4.0000 mg | ORAL_TABLET | Freq: Four times a day (QID) | ORAL | Status: DC | PRN

## 2014-05-19 NOTE — Patient Instructions (Addendum)
Please take all new medication as prescribed - the pain medication, and the muscle relaxer trial (tizanidine)  Please continue all other medications as before, and refills have been done if requested.  Please have the pharmacy call with any other refills you may need.  Please keep your appointments with your specialists as you may have planned
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
## 2014-05-19 NOTE — Progress Notes (Signed)
Pre visit review using our clinic review tool, if applicable. No additional management support is needed unless otherwise documented below in the visit note. 

## 2014-05-19 NOTE — Assessment & Plan Note (Signed)
stable overall by history and exam, recent data reviewed with pt, and pt to continue medical treatment as before,  to f/u any worsening symptoms or concerns BP Readings from Last 3 Encounters:  05/19/14 120/74  05/10/14 132/88  03/15/14 130/82
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
## 2014-05-21 DIAGNOSIS — R319 Hematuria, unspecified: Secondary | ICD-10-CM | POA: Diagnosis not present

## 2014-05-26 DIAGNOSIS — R311 Benign essential microscopic hematuria: Secondary | ICD-10-CM | POA: Diagnosis not present

## 2014-05-27 DIAGNOSIS — R311 Benign essential microscopic hematuria: Secondary | ICD-10-CM | POA: Diagnosis not present

## 2014-06-02 ENCOUNTER — Encounter: Payer: Self-pay | Admitting: Internal Medicine

## 2014-06-02 ENCOUNTER — Ambulatory Visit (INDEPENDENT_AMBULATORY_CARE_PROVIDER_SITE_OTHER): Payer: Commercial Managed Care - HMO | Admitting: Internal Medicine

## 2014-06-02 VITALS — BP 140/98 | HR 82 | Wt 134.0 lb

## 2014-06-02 DIAGNOSIS — M542 Cervicalgia: Secondary | ICD-10-CM | POA: Diagnosis not present

## 2014-06-02 DIAGNOSIS — M545 Low back pain, unspecified: Secondary | ICD-10-CM

## 2014-06-02 MED ORDER — NAPROXEN 500 MG PO TABS: 500.0000 mg | ORAL_TABLET | Freq: Two times a day (BID) | ORAL | Status: DC

## 2014-06-02 NOTE — Patient Instructions (Signed)
We are going to send in naproxen for you which you will take 1 pill two times per day with food. This will help with the pain and the inflammation in the muscles. We will give you two more weeks to heal. You can keep using the vicodin but stop using the tizanidine (muscle relaxer).   If you are not feeling better in about 2-3 weeks please come back and we will decide if we think physical therapy or sports medicine would help more.   Muscle Strain A muscle strain is an injury that occurs when a muscle is stretched beyond its normal length. Usually a small number of muscle fibers are torn when this happens. Muscle strain is rated in degrees. First-degree strains have the least amount of muscle fiber tearing and pain. Second-degree and third-degree strains have increasingly more tearing and pain.  Usually, recovery from muscle strain takes 1-2 weeks. Complete healing takes 5-6 weeks.  CAUSES  Muscle strain happens when a sudden, violent force placed on a muscle stretches it too far. This may occur with lifting, sports, or a fall.  RISK FACTORS Muscle strain is especially common in athletes.  SIGNS AND SYMPTOMS At the site of the muscle strain, there may be:  Pain.  Bruising.  Swelling.  Difficulty using the muscle due to pain or lack of normal function. DIAGNOSIS  Your health care provider will perform a physical exam and ask about your medical history. TREATMENT  Often, the best treatment for a muscle strain is resting, icing, and applying cold compresses to the injured area.  HOME CARE INSTRUCTIONS   Use the PRICE method of treatment to promote muscle healing during the first 2-3 days after your injury. The PRICE method involves:  Protecting the muscle from being injured again.  Restricting your activity and resting the injured body part.  Icing your injury. To do this, put ice in a plastic bag. Place a towel between your skin and the bag. Then, apply the ice and leave it on from  15-20 minutes each hour. After the third day, switch to moist heat packs.  Apply compression to the injured area with a splint or elastic bandage. Be careful not to wrap it too tightly. This may interfere with blood circulation or increase swelling.  Elevate the injured body part above the level of your heart as often as you can.  Only take over-the-counter or prescription medicines for pain, discomfort, or fever as directed by your health care provider.  Warming up prior to exercise helps to prevent future muscle strains. SEEK MEDICAL CARE IF:   You have increasing pain or swelling in the injured area.  You have numbness, tingling, or a significant loss of strength in the injured area. MAKE SURE YOU:   Understand these instructions.  Will watch your condition.  Will get help right away if you are not doing well or get worse. Document Released: 04/30/2005 Document Revised: 02/18/2013 Document Reviewed: 11/27/2012 [facility] Patient Information 2015 [place], [place]. This information is not intended to replace advice given to you by your health care provider. Make sure you discuss any questions you have with your health care provider.

## 2014-06-02 NOTE — Progress Notes (Signed)
Pre visit review using our clinic review tool, if applicable. No additional management support is needed unless otherwise documented below in the visit note. 

## 2014-06-05 DIAGNOSIS — M542 Cervicalgia: Secondary | ICD-10-CM | POA: Insufficient documentation

## 2014-06-05 NOTE — Assessment & Plan Note (Signed)
Will rx naproxen (kidney function normal) for next 2 weeks to help with inflammation, advised she should use heating pad as well. Vicodin can be used sparingly as well as the tizanidine if needed. If no improvement in pain she will return and talked with her about return parameters sooner.

## 2014-06-05 NOTE — Assessment & Plan Note (Signed)
Patient is not complaining so much of her low back pain today although states it is still there but much milder.
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
## 2014-06-07 ENCOUNTER — Other Ambulatory Visit: Payer: Self-pay | Admitting: Ophthalmology

## 2014-06-07 DIAGNOSIS — D485 Neoplasm of uncertain behavior of skin: Secondary | ICD-10-CM | POA: Diagnosis not present

## 2014-06-07 DIAGNOSIS — H018 Other specified inflammations of eyelid: Secondary | ICD-10-CM | POA: Diagnosis not present

## 2014-06-07 DIAGNOSIS — D2311 Other benign neoplasm of skin of right eyelid, including canthus: Secondary | ICD-10-CM | POA: Diagnosis not present

## 2014-06-14 ENCOUNTER — Ambulatory Visit: Payer: Commercial Managed Care - HMO | Admitting: Internal Medicine

## 2014-06-15 ENCOUNTER — Other Ambulatory Visit (INDEPENDENT_AMBULATORY_CARE_PROVIDER_SITE_OTHER): Payer: Commercial Managed Care - HMO

## 2014-06-15 ENCOUNTER — Ambulatory Visit (INDEPENDENT_AMBULATORY_CARE_PROVIDER_SITE_OTHER): Payer: Commercial Managed Care - HMO | Admitting: Internal Medicine

## 2014-06-15 ENCOUNTER — Encounter: Payer: Self-pay | Admitting: Internal Medicine

## 2014-06-15 VITALS — BP 102/76 | HR 67 | Temp 97.7°F | Resp 12 | Ht 66.0 in | Wt 137.0 lb

## 2014-06-15 DIAGNOSIS — E119 Type 2 diabetes mellitus without complications: Secondary | ICD-10-CM | POA: Diagnosis not present

## 2014-06-15 LAB — HEMOGLOBIN A1C: Hgb A1c MFr Bld: 6.4 % (ref 4.6–6.5)

## 2014-06-15 LAB — MICROALBUMIN / CREATININE URINE RATIO
Creatinine,U: 21 mg/dL
MICROALB/CREAT RATIO: 3.3 mg/g (ref 0.0–30.0)

## 2014-06-15 LAB — URINALYSIS, ROUTINE W REFLEX MICROSCOPIC
Bilirubin Urine: NEGATIVE
Ketones, ur: NEGATIVE
Leukocytes, UA: NEGATIVE
Nitrite: NEGATIVE
Total Protein, Urine: NEGATIVE
UROBILINOGEN UA: 0.2 (ref 0.0–1.0)
Urine Glucose: NEGATIVE
WBC, UA: NONE SEEN (ref 0–?)
pH: 5.5 (ref 5.0–8.0)

## 2014-06-15 LAB — VITAMIN D 25 HYDROXY (VIT D DEFICIENCY, FRACTURES): VITD: 54.04 ng/mL (ref 30.00–100.00)

## 2014-06-15 NOTE — Progress Notes (Signed)
Pre visit review using our clinic review tool, if applicable. No additional management support is needed unless otherwise documented below in the visit note. 

## 2014-06-15 NOTE — Patient Instructions (Signed)
We will check the blood and the urine today for the vitamin D and the diabetes.   I'm glad to see that you are feeling better.   Come back and see Dr. Asa Lente in about 6 months or so for your check up.

## 2014-06-17 NOTE — Progress Notes (Signed)
   Subjective:    Patient ID: Monica [name], female    DOB: 1940-06-08, 74 y.o.   MRN: [id number]  HPI The patient is a 74 YO female who is coming in today to follow up on her diabetes. She has not been having hypoglycemia or hyperglycemia. She has not been exercising more and admits that her diet is not the best. She continues to take her metformin without GI side effects. At last check her diabetes was well controlled. She denies any complications or numbness in her feet. She has not had eye exam in the last year.   Review of Systems  Constitutional: Negative for fever, activity change, appetite change, fatigue and unexpected weight change.  HENT: Negative.   Eyes: Negative.   Respiratory: Negative for cough, chest tightness, shortness of breath and wheezing.   Cardiovascular: Negative for chest pain, palpitations and leg swelling.  Gastrointestinal: Negative.   Musculoskeletal: Positive for arthralgias. Negative for myalgias, gait problem, neck pain and neck stiffness.  Skin: Negative.   Neurological: Negative for dizziness, weakness, light-headedness, numbness and headaches.  Psychiatric/Behavioral: Negative.       Objective:   Physical Exam  Constitutional: She is oriented to person, place, and time. She appears well-developed and well-nourished.  HENT:  Head: Normocephalic and atraumatic.  Right Ear: External ear normal.  Left Ear: External ear normal.  Eyes: EOM are normal.  Neck: Normal range of motion. Neck supple.  Tenderness in the muscles in the neck and shoulder region gone since last exam  Cardiovascular: Normal rate and regular rhythm.   Pulmonary/Chest: Effort normal and breath sounds normal. No respiratory distress. She has no wheezes. She has no rales.  Abdominal: Soft. She exhibits no distension. There is no tenderness.  Musculoskeletal: She exhibits no edema or tenderness.  Neurological: She is alert and oriented to person, place, and time. Coordination normal.    Skin: Skin is warm and dry.   Filed Vitals:   06/15/14 1053  BP: 102/76  Pulse: 67  Temp: 97.7 F (36.5 C)  TempSrc: Oral  Resp: 12  Height: 5\' 6"  (1.676 m)  Weight: 137 lb (62.143 kg)  SpO2: 97%      Assessment & Plan:  Patient declines all injections today.

## 2014-06-17 NOTE — Assessment & Plan Note (Signed)
Advised her that she is at higher risk for pneumonia and flu but she declines those today. Check HgA1c, microalbumin to creatinine ratio. Foot exam up to date. Reminded about eye exam. Currently on metformin and well controlled so do not anticipate change to regimen.

## 2014-06-29 ENCOUNTER — Other Ambulatory Visit: Payer: Self-pay | Admitting: Internal Medicine

## 2014-07-06 ENCOUNTER — Encounter: Payer: Self-pay | Admitting: Internal Medicine

## 2014-07-06 ENCOUNTER — Ambulatory Visit (INDEPENDENT_AMBULATORY_CARE_PROVIDER_SITE_OTHER): Payer: Commercial Managed Care - HMO | Admitting: Internal Medicine

## 2014-07-06 VITALS — BP 116/74 | HR 72 | Temp 98.4°F | Resp 16 | Wt 138.0 lb

## 2014-07-06 DIAGNOSIS — L02212 Cutaneous abscess of back [any part, except buttock]: Secondary | ICD-10-CM

## 2014-07-06 MED ORDER — SULFAMETHOXAZOLE-TRIMETHOPRIM 800-160 MG PO TABS: 1.0000 | ORAL_TABLET | Freq: Two times a day (BID) | ORAL | Status: DC

## 2014-07-06 NOTE — Progress Notes (Signed)
Pre visit review using our clinic review tool, if applicable. No additional management support is needed unless otherwise documented below in the visit note. 

## 2014-07-06 NOTE — Progress Notes (Signed)
   Subjective:    Patient ID: JAASIA VIGLIONE, female    DOB: Oct 23, 1940, 74 y.o.   MRN: [id number]  HPI The patient is a 74 YO female who is coming in for a knot on her back. It has been present for 2 weeks. It has not been growing. It is tender to the touch and when sitting down it hurts when pushed against chairs. She denies associated fevers or chills. It is about 4/10 in pain when it hurts. Nothing has drained out of it.   Review of Systems  Constitutional: Negative.   Respiratory: Negative.   Cardiovascular: Negative.   Gastrointestinal: Negative.   Musculoskeletal: Positive for back pain.  Skin: Positive for wound.      Objective:   Physical Exam  Constitutional: She appears well-developed and well-nourished.  HENT:  Head: Normocephalic and atraumatic.  Eyes: EOM are normal.  Neck: Normal range of motion.  Cardiovascular: Normal rate and regular rhythm.   Abdominal: Soft.  Skin: Skin is warm and dry.  Small pin head, with 1.5 cm hard quarter sized region under the pin head.    Filed Vitals:   07/06/14 0801  BP: 116/74  Pulse: 72  Temp: 98.4 F (36.9 C)  TempSrc: Oral  Resp: 16  Weight: 138 lb (62.596 kg)  SpO2: 99%      Assessment & Plan:

## 2014-07-06 NOTE — Assessment & Plan Note (Signed)
Above her bra line left back. Hard area about the size of a quarter. Not drainable today. Will give 1 week bactrim and if no resolution she may still need I and D.

## 2014-07-06 NOTE — Patient Instructions (Signed)
We have sent in an antibiotic for the spot on your back called bactrim. Take 1 pill twice a day for 1 week. This should help this spot to go away. You can use a warm cloth or warm bath on it to help to area feel better. The naproxen you have at home you can use for pain.   If it is not better in 2 weeks call us and come back as we may have to drain it.   Please call us with any problems or questions, if you have any trouble with the antibiotic please call us and we can change it.

## 2014-07-11 ENCOUNTER — Other Ambulatory Visit: Payer: Self-pay | Admitting: Internal Medicine

## 2014-07-21 ENCOUNTER — Other Ambulatory Visit: Payer: Self-pay | Admitting: Internal Medicine

## 2014-07-23 ENCOUNTER — Other Ambulatory Visit (HOSPITAL_COMMUNITY): Payer: Self-pay

## 2014-07-23 DIAGNOSIS — D649 Anemia, unspecified: Secondary | ICD-10-CM

## 2014-07-29 ENCOUNTER — Ambulatory Visit: Payer: Commercial Managed Care - HMO | Admitting: Internal Medicine

## 2014-07-29 ENCOUNTER — Encounter (HOSPITAL_BASED_OUTPATIENT_CLINIC_OR_DEPARTMENT_OTHER): Payer: Medicare HMO

## 2014-07-29 ENCOUNTER — Encounter (HOSPITAL_COMMUNITY): Payer: Medicare HMO | Attending: Hematology & Oncology | Admitting: Hematology & Oncology

## 2014-07-29 ENCOUNTER — Encounter (HOSPITAL_COMMUNITY): Payer: Self-pay | Admitting: Hematology & Oncology

## 2014-07-29 VITALS — BP 122/71 | HR 68 | Temp 98.6°F | Resp 18 | Wt 138.0 lb

## 2014-07-29 DIAGNOSIS — D508 Other iron deficiency anemias: Secondary | ICD-10-CM | POA: Insufficient documentation

## 2014-07-29 DIAGNOSIS — D500 Iron deficiency anemia secondary to blood loss (chronic): Secondary | ICD-10-CM | POA: Diagnosis not present

## 2014-07-29 DIAGNOSIS — K909 Intestinal malabsorption, unspecified: Secondary | ICD-10-CM | POA: Diagnosis not present

## 2014-07-29 DIAGNOSIS — D649 Anemia, unspecified: Secondary | ICD-10-CM

## 2014-07-29 LAB — CBC WITH DIFFERENTIAL/PLATELET
Basophils Absolute: 0 10*3/uL (ref 0.0–0.1)
Basophils Relative: 0 % (ref 0–1)
EOS ABS: 0 10*3/uL (ref 0.0–0.7)
EOS PCT: 0 % (ref 0–5)
HCT: 39.2 % (ref 36.0–46.0)
Hemoglobin: 12.6 g/dL (ref 12.0–15.0)
LYMPHS ABS: 1.5 10*3/uL (ref 0.7–4.0)
Lymphocytes Relative: 55 % — ABNORMAL HIGH (ref 12–46)
MCH: 30.4 pg (ref 26.0–34.0)
MCHC: 32.1 g/dL (ref 30.0–36.0)
MCV: 94.7 fL (ref 78.0–100.0)
MONO ABS: 0.2 10*3/uL (ref 0.1–1.0)
Monocytes Relative: 8 % (ref 3–12)
Neutro Abs: 1 10*3/uL — ABNORMAL LOW (ref 1.7–7.7)
Neutrophils Relative %: 37 % — ABNORMAL LOW (ref 43–77)
Platelets: 220 10*3/uL (ref 150–400)
RBC: 4.14 MIL/uL (ref 3.87–5.11)
RDW: 13.8 % (ref 11.5–15.5)
WBC: 2.8 10*3/uL — AB (ref 4.0–10.5)

## 2014-07-29 LAB — FERRITIN: Ferritin: 212 ng/mL (ref 10–291)

## 2014-07-29 NOTE — Progress Notes (Signed)
Monica [name] presented for labwork. Labs per MD order drawn via Peripheral Line 25 gauge needle inserted in rt ac.  Good blood return present. Procedure without incident.  Needle removed intact. Patient tolerated procedure well.

## 2014-07-29 NOTE — Addendum Note (Signed)
Addended by: Joie Bimler on: 07/29/2014 11:51 AM   Modules accepted: Orders

## 2014-07-29 NOTE — Progress Notes (Signed)
Monica [name] [address] [facility] [address] 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  Gastroesophageal reflux disease with stricture, normal esophageal manometry, on long-term proton pump inhibitor therapy, status post dilatation.   Serum ferritin of 4.3 ng/ml 09/07/2013  CURRENT THERAPY: IV iron prn  INTERVAL HISTORY: Monica [name] 74 y.o. female returns for follow-up of iron deficiency anemia. Her last CBC showed a normal H&H with normal indices. She states she has had intermittent problems with vaginal bleeding. She has been evaluated Dr. Ulanda Edison. She states she has not had any additional bleeding for several months. She also sees blood on the toilet paper, she tends to run constipated. Her last colonoscopy was in 2015 and showed internal hemorrhoids. She goes to [facility] for a lot of her medical care and sees Dr.Kollar for her primary care needs.  She has no other major complaints today.  MEDICAL HISTORY: Past Medical History  Diagnosis Date  . Hypertension   . GERD (gastroesophageal reflux disease)     w/ HH (EGD 2010)  . 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 Stricture and stenosis of esophagus 2006  . Arthritis   . OSTEOPENIA   . Mixed hyperlipidemia   . LOW BACK PAIN, CHRONIC   . GLAUCOMA   . DEPRESSION, SITUATIONAL   . Chronic constipation   . ANXIETY   . Diabetes mellitus dx 02/2013    has Mixed hyperlipidemia; ANXIETY; DEPRESSION, SITUATIONAL; GLAUCOMA; Essential hypertension; ESOPHAGEAL STRICTURE; HIATAL HERNIA; Irritable bowel syndrome; Dyspareunia; Arthropathy; LOW BACK PAIN, CHRONIC; OSTEOPENIA; Chronic constipation; GERD (gastroesophageal reflux disease); Frozen shoulder; DM type 2 (diabetes mellitus, type 2); Internal hemorrhoids; Loss of weight; Iron deficiency anemia due to chronic blood loss; and Abscess  of back on her problem list.     is allergic to codeine and hydrochlorothiazide.  [name] does not currently have medications on file.  SURGICAL HISTORY: Past Surgical History  Procedure Laterality Date  . Tubal ligation    . Back surgery    . Hemorroidectomy    . Rotator cuff repair      right   . Hysteroscopy w/d&c  07/20/2011    Procedure: DILATATION AND CURETTAGE /HYSTEROSCOPY;  Surgeon: Melina Schools, [name];  Location: [facility];  Service: Gynecology;  Laterality: N/A;  . Polypectomy  07/20/2011    Procedure: POLYPECTOMY;  Surgeon: Melina Schools, [name];  Location: [facility];  Service: Gynecology;  Laterality: N/A;  . Esophageal manometry N/A 02/09/2013    Procedure: ESOPHAGEAL MANOMETRY (EM);  Surgeon: Sable Feil, [name];  Location: [facility];  Service: Endoscopy;  Laterality: N/A;    SOCIAL HISTORY: History   Social History  . Marital Status: Divorced    Spouse Name: N/A  . Number of Children: 5  . Years of Education: N/A   Occupational History  . retired    Social History Main Topics  . Smoking status: Never Smoker   . Smokeless tobacco: Never Used  . Alcohol Use: No  . Drug Use: No  . Sexual Activity: No   Other Topics Concern  . Not on file   Social History Narrative    FAMILY HISTORY: Family History  Problem Relation Age of Onset  . Heart failure Father   . Diabetes Mother   . Breast cancer Sister   . Colon cancer Sister  60  . Diabetes Brother     Review of Systems  Constitutional: Negative for fever, chills, weight loss and malaise/fatigue.  HENT: Negative for congestion, hearing loss, nosebleeds, sore throat and tinnitus.   Eyes: Negative for blurred vision, double vision, pain and discharge.  Respiratory: Negative for cough, hemoptysis, sputum production, shortness of breath and wheezing.   Cardiovascular: Negative for chest pain, palpitations, claudication, leg swelling and PND.  Gastrointestinal: Positive for constipation. Negative for  heartburn, nausea, vomiting, abdominal pain, diarrhea, blood in stool and melena.  Genitourinary: Negative for dysuria, urgency, frequency and hematuria.  Musculoskeletal: Negative for myalgias, joint pain and falls.  Skin: Negative for itching and rash.  Neurological: Negative for dizziness, tingling, tremors, sensory change, speech change, focal weakness, seizures, loss of consciousness, weakness and headaches.  Endo/Heme/Allergies: Does not bruise/bleed easily.  Psychiatric/Behavioral: Negative for depression, suicidal ideas, memory loss and substance abuse. The patient is not nervous/anxious and does not have insomnia.     PHYSICAL EXAMINATION  ECOG PERFORMANCE STATUS: 0 - Asymptomatic  There were no vitals filed for this visit.  Physical Exam  Constitutional: She is oriented to person, place, and time and well-developed, well-nourished, and in no distress.  HENT:  Head: Normocephalic and atraumatic.  Nose: Nose normal.  Mouth/Throat: Oropharynx is clear and moist. No oropharyngeal exudate.  Eyes: Conjunctivae and EOM are normal. Pupils are equal, round, and reactive to light. Right eye exhibits no discharge. Left eye exhibits no discharge. No scleral icterus.  Neck: Normal range of motion. Neck supple. No tracheal deviation present. No thyromegaly present.  Cardiovascular: Normal rate, regular rhythm and normal heart sounds.  Exam reveals no gallop and no friction rub.   No murmur heard. Pulmonary/Chest: Effort normal and breath sounds normal. She has no wheezes. She has no rales.  Abdominal: Soft. Bowel sounds are normal. She exhibits no distension and no mass. There is no tenderness. There is no rebound and no guarding.  Musculoskeletal: Normal range of motion. She exhibits no edema.  Lymphadenopathy:    She has no cervical adenopathy.  Neurological: She is alert and oriented to person, place, and time. She has normal reflexes. No cranial nerve deficit. Gait normal. Coordination  normal.  Skin: Skin is warm and dry. No rash noted.  Psychiatric: Mood, memory, affect and judgment normal.  Nursing note and vitals reviewed.   LABORATORY DATA:  CBC    Component Value Date/Time   WBC 3.6* 01/27/2014 1104   RBC 4.61 01/27/2014 1104   RBC 4.62 12/17/2013 1444   HGB 13.1 01/27/2014 1104   HCT 40.2 01/27/2014 1104   PLT 246 01/27/2014 1104   MCV 87.2 01/27/2014 1104   MCH 28.4 01/27/2014 1104   MCHC 32.6 01/27/2014 1104   RDW 20.0* 01/27/2014 1104   LYMPHSABS 1.8 01/27/2014 1104   MONOABS 0.2 01/27/2014 1104   EOSABS 0.0 01/27/2014 1104   BASOSABS 0.0 01/27/2014 1104   CMP     Component Value Date/Time   NA 140 12/17/2013 1444   K 4.5 12/17/2013 1444   CL 102 12/17/2013 1444   CO2 27 12/17/2013 1444   GLUCOSE 93 12/17/2013 1444   BUN 13 12/17/2013 1444   CREATININE 0.75 12/17/2013 1444   CALCIUM 9.7 12/17/2013 1444   PROT 7.5 12/17/2013 1444   ALBUMIN 4.1 12/17/2013 1444   AST 21 12/17/2013 1444   ALT 10 12/17/2013 1444   ALKPHOS 55 12/17/2013 1444   BILITOT 0.4 12/17/2013 1444   GFRNONAA 83* 12/17/2013 1444  GFRAA >90 12/17/2013 1444       ASSESSMENT and THERAPY PLAN:   Iron deficiency anemia  Blood counts since receiving IV iron have remained normal. She is up-to-date on colonoscopy. She has problems with constipation and internal hemorrhoids. By her report she has had problems with vaginal bleeding but it has been evaluated by her gynecologist, Dr. Ulanda Edison. I do not see a ferritin on her blood work today and we will try to add this. In addition given her recent problems I would like to see her back in 3 months with repeat studies. If she has interim concerns or worsening of her baseline problems I have advised her to contact us.   All questions were answered. The patient knows to call the clinic with any problems, questions or concerns. We can certainly see the patient much sooner if necessary. This note was electronically  signed. Molli Hazard 07/29/2014

## 2014-07-29 NOTE — Patient Instructions (Signed)
[place] at [facility] Discharge Instructions  RECOMMENDATIONS MADE BY THE CONSULTANT AND ANY TEST RESULTS WILL BE SENT TO YOUR REFERRING PHYSICIAN.  Return in 3 months for lab work and office visit.  Thank you for choosing [place] at [facility] to provide your oncology and hematology care.  To afford each patient quality time with our provider, please arrive at least 15 minutes before your scheduled appointment time.    You need to re-schedule your appointment should you arrive 10 or more minutes late.  We strive to give you quality time with our providers, and arriving late affects you and other patients whose appointments are after yours.  Also, if you no show three or more times for appointments you may be dismissed from the clinic at the providers discretion.     Again, thank you for choosing [facility].  Our hope is that these requests will decrease the amount of time that you wait before being seen by our physicians.       _____________________________________________________________  Should you have questions after your visit to [facility], please contact our office at [phone] between the hours of 8:30 a.m. and 4:30 p.m.  Voicemails left after 4:30 p.m. will not be returned until the following business day.  For prescription refill requests, have your pharmacy contact our office.

## 2014-08-03 ENCOUNTER — Ambulatory Visit (INDEPENDENT_AMBULATORY_CARE_PROVIDER_SITE_OTHER): Payer: Commercial Managed Care - HMO | Admitting: Internal Medicine

## 2014-08-03 ENCOUNTER — Encounter: Payer: Self-pay | Admitting: Internal Medicine

## 2014-08-03 VITALS — BP 124/82 | HR 77 | Temp 98.0°F | Resp 12 | Ht 66.0 in | Wt 138.4 lb

## 2014-08-03 DIAGNOSIS — L02212 Cutaneous abscess of back [any part, except buttock]: Secondary | ICD-10-CM | POA: Diagnosis not present

## 2014-08-03 DIAGNOSIS — H547 Unspecified visual loss: Secondary | ICD-10-CM

## 2014-08-03 DIAGNOSIS — Z780 Asymptomatic menopausal state: Secondary | ICD-10-CM | POA: Diagnosis not present

## 2014-08-03 NOTE — Progress Notes (Signed)
Pre visit review using our clinic review tool, if applicable. No additional management support is needed unless otherwise documented below in the visit note. 
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
## 2014-08-05 NOTE — Assessment & Plan Note (Signed)
Resolved and no need for drainage or further antibiotics. Advised that the darker skin will likely fade in the next several months.

## 2014-08-05 NOTE — Progress Notes (Signed)
   Subjective:    Patient ID: Monica [name], female    DOB: August 18, 1940, 74 y.o.   MRN: [id number]  HPI The patient is here to follow up on the abscess on her back. She did complete the antibiotics that she was given at last visit. Seems to be hurting less. Denies pain for the last few days. She thinks it is smaller or gone. She can not see the area. Denies fevers or chills.   Review of Systems  Constitutional: Negative.   Respiratory: Negative.   Cardiovascular: Negative.   Gastrointestinal: Negative.   Skin: Positive for wound.       She isn't sure if it is gone      Objective:   Physical Exam  Constitutional: She appears well-developed and well-nourished.  HENT:  Head: Normocephalic and atraumatic.  Eyes: EOM are normal.  Neck: Normal range of motion.  Cardiovascular: Normal rate and regular rhythm.   Abdominal: Soft.  Skin: Skin is warm and dry.  Area of darker skin but not raised and no signs of infection or fluctuance under the skin. Appears healed.    Filed Vitals:   08/03/14 1037  BP: 124/82  Pulse: 77  Temp: 98 F (36.7 C)  TempSrc: Oral  Resp: 12  Height: 5\' 6"  (1.676 m)  Weight: 138 lb 6.4 oz (62.778 kg)  SpO2: 98%      Assessment & Plan:

## 2014-08-12 ENCOUNTER — Other Ambulatory Visit: Payer: Self-pay | Admitting: Internal Medicine

## 2014-08-16 ENCOUNTER — Other Ambulatory Visit: Payer: Self-pay | Admitting: Internal Medicine

## 2014-10-28 ENCOUNTER — Encounter (HOSPITAL_COMMUNITY): Payer: Commercial Managed Care - HMO | Attending: Hematology & Oncology | Admitting: Hematology & Oncology

## 2014-10-28 ENCOUNTER — Encounter (HOSPITAL_COMMUNITY): Payer: Self-pay | Admitting: Hematology & Oncology

## 2014-10-28 ENCOUNTER — Encounter (HOSPITAL_BASED_OUTPATIENT_CLINIC_OR_DEPARTMENT_OTHER): Payer: Commercial Managed Care - HMO

## 2014-10-28 VITALS — BP 123/64 | HR 71 | Temp 97.6°F | Resp 18 | Wt 135.6 lb

## 2014-10-28 DIAGNOSIS — D509 Iron deficiency anemia, unspecified: Secondary | ICD-10-CM | POA: Diagnosis not present

## 2014-10-28 DIAGNOSIS — D649 Anemia, unspecified: Secondary | ICD-10-CM | POA: Diagnosis not present

## 2014-10-28 DIAGNOSIS — D500 Iron deficiency anemia secondary to blood loss (chronic): Secondary | ICD-10-CM | POA: Diagnosis not present

## 2014-10-28 DIAGNOSIS — D508 Other iron deficiency anemias: Secondary | ICD-10-CM

## 2014-10-28 LAB — COMPREHENSIVE METABOLIC PANEL
ALT: 15 U/L (ref 14–54)
ANION GAP: 7 (ref 5–15)
AST: 20 U/L (ref 15–41)
Albumin: 4.2 g/dL (ref 3.5–5.0)
Alkaline Phosphatase: 47 U/L (ref 38–126)
BILIRUBIN TOTAL: 1 mg/dL (ref 0.3–1.2)
BUN: 14 mg/dL (ref 6–20)
CHLORIDE: 105 mmol/L (ref 101–111)
CO2: 29 mmol/L (ref 22–32)
CREATININE: 0.74 mg/dL (ref 0.44–1.00)
Calcium: 9.5 mg/dL (ref 8.9–10.3)
GFR calc non Af Amer: >60 mL/min (ref >60)
GLUCOSE: 71 mg/dL (ref 65–99)
POTASSIUM: 3.8 mmol/L (ref 3.5–5.1)
Sodium: 141 mmol/L (ref 135–145)
Total Protein: 7.4 g/dL (ref 6.5–8.1)

## 2014-10-28 LAB — CBC WITH DIFFERENTIAL/PLATELET
Basophils Absolute: 0 10*3/uL (ref 0.0–0.1)
Basophils Relative: 1 % (ref 0–1)
EOS PCT: 0 % (ref 0–5)
Eosinophils Absolute: 0 10*3/uL (ref 0.0–0.7)
HEMATOCRIT: 39.3 % (ref 36.0–46.0)
Hemoglobin: 12.5 g/dL (ref 12.0–15.0)
LYMPHS PCT: 47 % — AB (ref 12–46)
Lymphs Abs: 1.5 10*3/uL (ref 0.7–4.0)
MCH: 30.6 pg (ref 26.0–34.0)
MCHC: 31.8 g/dL (ref 30.0–36.0)
MCV: 96.1 fL (ref 78.0–100.0)
MONO ABS: 0.3 10*3/uL (ref 0.1–1.0)
MONOS PCT: 9 % (ref 3–12)
Neutro Abs: 1.4 10*3/uL — ABNORMAL LOW (ref 1.7–7.7)
Neutrophils Relative %: 43 % (ref 43–77)
Platelets: 216 10*3/uL (ref 150–400)
RBC: 4.09 MIL/uL (ref 3.87–5.11)
RDW: 13.9 % (ref 11.5–15.5)
WBC: 3.2 10*3/uL — AB (ref 4.0–10.5)

## 2014-10-28 LAB — IRON AND TIBC
IRON: 78 ug/dL (ref 28–170)
SATURATION RATIOS: 28 % (ref 10.4–31.8)
TIBC: 279 ug/dL (ref 250–450)
UIBC: 201 ug/dL

## 2014-10-28 LAB — FERRITIN: FERRITIN: 145 ng/mL (ref 11–307)

## 2014-10-28 NOTE — Progress Notes (Deleted)
..  Monica [name]'s reason for visit today are for labs as scheduled per MD orders.  Venipuncture performed with a 23 gauge butterfly needle to {Anatomy; iv placement site:12688}.  Monica [name] tolerated venipuncture well and without incident; questions were answered and patient was discharged.

## 2014-10-28 NOTE — Progress Notes (Signed)
[name], MD 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  Gastroesophageal reflux disease with stricture, normal esophageal manometry, on long-term proton pump inhibitor therapy, status post dilatation.   Serum ferritin of 4.3 ng/ml 09/07/2013  CURRENT THERAPY: IV iron prn  INTERVAL HISTORY: DOREA DUFF 74 y.o. female returns for follow-up of iron deficiency anemia. Her last CBC showed a normal H&H with normal indices. She states she has had intermittent problems with vaginal bleeding. She has been evaluated Dr. Ulanda Edison. She states she has not had any additional bleeding for several months. She also sees blood on the toilet paper, she tends to run constipated. Her last colonoscopy was in 2015 and showed internal hemorrhoids. She goes to [facility] for a lot of her medical care and sees Dr.Kollar for her primary care needs.  She has no other major complaints today.  MEDICAL HISTORY: Past Medical History  Diagnosis Date  . Hypertension   . GERD (gastroesophageal reflux disease)     w/ HH (EGD 2010)  . 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 Stricture and stenosis of esophagus 2006  . Arthritis   . OSTEOPENIA   . Mixed hyperlipidemia   . LOW BACK PAIN, CHRONIC   . GLAUCOMA   . DEPRESSION, SITUATIONAL   . Chronic constipation   . ANXIETY   . Diabetes mellitus dx 02/2013    has Mixed hyperlipidemia; ANXIETY; DEPRESSION, SITUATIONAL; GLAUCOMA; Essential hypertension; ESOPHAGEAL STRICTURE; HIATAL HERNIA; Irritable bowel syndrome; Dyspareunia; Arthropathy; LOW BACK PAIN, CHRONIC; OSTEOPENIA; Chronic constipation; GERD (gastroesophageal reflux disease); Frozen shoulder; DM type 2 (diabetes mellitus, type 2); Internal hemorrhoids; Loss of weight; Iron deficiency anemia due to chronic blood loss; and Abscess  of back on her problem list.     is allergic to codeine and hydrochlorothiazide.  Ms. Habel had no medications administered during this visit.  SURGICAL HISTORY: Past Surgical History  Procedure Laterality Date  . Tubal ligation    . Back surgery    . Hemorroidectomy    . Rotator cuff repair      right   . Hysteroscopy w/d&c  07/20/2011    Procedure: DILATATION AND CURETTAGE /HYSTEROSCOPY;  Surgeon: Melina Schools, MD;  Location: [facility];  Service: Gynecology;  Laterality: N/A;  . Polypectomy  07/20/2011    Procedure: POLYPECTOMY;  Surgeon: Melina Schools, MD;  Location: [facility];  Service: Gynecology;  Laterality: N/A;  . Esophageal manometry N/A 02/09/2013    Procedure: ESOPHAGEAL MANOMETRY (EM);  Surgeon: Sable Feil, MD;  Location: [facility];  Service: Endoscopy;  Laterality: N/A;    SOCIAL HISTORY: History   Social History  . Marital Status: Divorced    Spouse Name: N/A  . Number of Children: 5  . Years of Education: N/A   Occupational History  . retired    Social History Main Topics  . Smoking status: Never Smoker   . Smokeless tobacco: Never Used  . Alcohol Use: No  . Drug Use: No  . Sexual Activity: No   Other Topics Concern  . Not on file   Social History Narrative    FAMILY HISTORY: Family History  Problem Relation Age of Onset  . Heart failure Father   . Diabetes Mother   . Breast cancer Sister   . Colon cancer Sister 20  .  Diabetes Brother     Review of Systems  Constitutional: Negative for fever, chills, weight loss and malaise/fatigue.  HENT: Negative for congestion, hearing loss, nosebleeds, sore throat and tinnitus.   Eyes: Negative for blurred vision, double vision, pain and discharge.  Respiratory: Negative for cough, hemoptysis, sputum production, shortness of breath and wheezing.   Cardiovascular: Negative for chest pain, palpitations, claudication, leg swelling and PND.  Gastrointestinal: Positive for constipation. Negative for  heartburn, nausea, vomiting, abdominal pain, diarrhea, blood in stool and melena.  Genitourinary: Negative for dysuria, urgency, frequency and hematuria.  Musculoskeletal: Negative for myalgias, joint pain and falls.  Skin: Negative for itching and rash.  Neurological: Negative for dizziness, tingling, tremors, sensory change, speech change, focal weakness, seizures, loss of consciousness, weakness and headaches.  Endo/Heme/Allergies: Does not bruise/bleed easily.  Psychiatric/Behavioral: Negative for depression, suicidal ideas, memory loss and substance abuse. The patient is not nervous/anxious and does not have insomnia.     PHYSICAL EXAMINATION  ECOG PERFORMANCE STATUS: 0 - Asymptomatic  Filed Vitals:   10/28/14 1046  BP: 123/64  Pulse: 71  Temp: 97.6 F (36.4 C)  Resp: 18    Physical Exam  Constitutional: She is oriented to person, place, and time and well-developed, well-nourished, and in no distress.  HENT:  Head: Normocephalic and atraumatic.  Nose: Nose normal.  Mouth/Throat: Oropharynx is clear and moist. No oropharyngeal exudate.  Eyes: Conjunctivae and EOM are normal. Pupils are equal, round, and reactive to light. Right eye exhibits no discharge. Left eye exhibits no discharge. No scleral icterus.  Neck: Normal range of motion. Neck supple. No tracheal deviation present. No thyromegaly present.  Cardiovascular: Normal rate, regular rhythm and normal heart sounds.  Exam reveals no gallop and no friction rub.   No murmur heard. Pulmonary/Chest: Effort normal and breath sounds normal. She has no wheezes. She has no rales.  Abdominal: Soft. Bowel sounds are normal. She exhibits no distension and no mass. There is no tenderness. There is no rebound and no guarding.  Musculoskeletal: Normal range of motion. She exhibits no edema.  Lymphadenopathy:    She has no cervical adenopathy.  Neurological: She is alert and oriented to person, place, and time. She has normal reflexes.  No cranial nerve deficit. Gait normal. Coordination normal.  Skin: Skin is warm and dry. No rash noted.  Psychiatric: Mood, memory, affect and judgment normal.  Nursing note and vitals reviewed.   LABORATORY DATA:  CBC    Component Value Date/Time   WBC 3.2* 10/28/2014 1023   RBC 4.09 10/28/2014 1023   RBC 4.62 12/17/2013 1444   HGB 12.5 10/28/2014 1023   HCT 39.3 10/28/2014 1023   PLT 216 10/28/2014 1023   MCV 96.1 10/28/2014 1023   MCH 30.6 10/28/2014 1023   MCHC 31.8 10/28/2014 1023   RDW 13.9 10/28/2014 1023   LYMPHSABS 1.5 10/28/2014 1023   MONOABS 0.3 10/28/2014 1023   EOSABS 0.0 10/28/2014 1023   BASOSABS 0.0 10/28/2014 1023   CMP     Component Value Date/Time   NA 141 10/28/2014 1023   K 3.8 10/28/2014 1023   CL 105 10/28/2014 1023   CO2 29 10/28/2014 1023   GLUCOSE 71 10/28/2014 1023   BUN 14 10/28/2014 1023   CREATININE 0.74 10/28/2014 1023   CALCIUM 9.5 10/28/2014 1023   PROT 7.4 10/28/2014 1023   ALBUMIN 4.2 10/28/2014 1023   AST 20 10/28/2014 1023   ALT 15 10/28/2014 1023   ALKPHOS 47 10/28/2014 1023  BILITOT 1.0 10/28/2014 1023   GFRNONAA >60 10/28/2014 1023   GFRAA >60 10/28/2014 1023       ASSESSMENT and THERAPY PLAN:   Iron deficiency anemia  Blood counts since receiving IV iron have remained normal. She is up-to-date on colonoscopy. She has problems with constipation and internal hemorrhoids. By her report she has had problems with vaginal bleeding but it has been evaluated by her gynecologist, Dr. Ulanda Edison. I do not see a ferritin on her blood work today and we will try to add this. In addition given her recent problems I would like to see her back in 3 months with repeat studies. If she has interim concerns or worsening of her baseline problems I have advised her to contact us.   All questions were answered. The patient knows to call the clinic with any problems, questions or concerns. We can certainly see the patient much sooner if  necessary. This note was electronically signed. 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  Gastroesophageal reflux disease with stricture, normal esophageal manometry, on long-term proton pump inhibitor therapy, status post dilatation.   Serum ferritin of 4.3 ng/ml 09/07/2013  CURRENT THERAPY: IV iron prn  INTERVAL HISTORY: KATRECE ROEDIGER 74 y.o. female returns for follow-up of iron deficiency anemia. Her last CBC showed a normal H&H with normal indices. She states she has had intermittent problems with vaginal bleeding. She has been evaluated Dr. Ulanda Edison. She states she has not had any additional bleeding for several months. She also sees blood on the toilet paper, she tends to run constipated. Her last colonoscopy was in 2015 and showed internal hemorrhoids. She goes to [facility] for a lot of her medical care and sees Dr.Kollar for her primary care needs.  She has no other major complaints today.  MEDICAL HISTORY: Past Medical History  Diagnosis Date  . Hypertension   . GERD (gastroesophageal reflux disease)     w/ HH (EGD 2010)  . 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 Stricture and stenosis of esophagus 2006  . Arthritis   . OSTEOPENIA   . Mixed hyperlipidemia   . LOW BACK PAIN, CHRONIC   . GLAUCOMA   . DEPRESSION, SITUATIONAL   . Chronic constipation   . ANXIETY   . Diabetes mellitus dx 02/2013    has Mixed hyperlipidemia; ANXIETY; DEPRESSION, SITUATIONAL; GLAUCOMA; Essential hypertension; ESOPHAGEAL STRICTURE; HIATAL HERNIA; Irritable bowel syndrome; Dyspareunia; Arthropathy; LOW BACK PAIN, CHRONIC; OSTEOPENIA; Chronic constipation; GERD (gastroesophageal reflux disease); Frozen shoulder; DM type 2 (diabetes mellitus, type 2); Internal  hemorrhoids; Loss of weight; Iron deficiency anemia due to chronic blood loss; and Abscess of back on her problem list.     is allergic to codeine and hydrochlorothiazide.  Ms. Freimark had no medications administered during this visit.  SURGICAL HISTORY: Past Surgical History  Procedure Laterality Date  . Tubal ligation    . Back surgery    . Hemorroidectomy    . Rotator cuff repair      right   . Hysteroscopy w/d&c  07/20/2011    Procedure: DILATATION AND CURETTAGE /HYSTEROSCOPY;  Surgeon: Melina Schools, MD;  Location: [facility];  Service: Gynecology;  Laterality: N/A;  . Polypectomy  07/20/2011    Procedure:  POLYPECTOMY;  Surgeon: Melina Schools, MD;  Location: [facility];  Service: Gynecology;  Laterality: N/A;  . Esophageal manometry N/A 02/09/2013    Procedure: ESOPHAGEAL MANOMETRY (EM);  Surgeon: Sable Feil, MD;  Location: [facility];  Service: Endoscopy;  Laterality: N/A;    SOCIAL HISTORY: History   Social History  . Marital Status: Divorced    Spouse Name: N/A  . Number of Children: 5  . Years of Education: N/A   Occupational History  . retired    Social History Main Topics  . Smoking status: Never Smoker   . Smokeless tobacco: Never Used  . Alcohol Use: No  . Drug Use: No  . Sexual Activity: No   Other Topics Concern  . Not on file   Social History Narrative    FAMILY HISTORY: Family History  Problem Relation Age of Onset  . Heart failure Father   . Diabetes Mother   . Breast cancer Sister   . Colon cancer Sister 40  . Diabetes Brother     Review of Systems  Constitutional: Negative for fever, chills, weight loss and malaise/fatigue.  HENT: Negative for congestion, hearing loss, nosebleeds, sore throat and tinnitus.   Eyes: Negative for blurred vision, double vision, pain and discharge.  Respiratory: Negative for cough, hemoptysis, sputum production, shortness of breath and wheezing.   Cardiovascular: Negative for chest pain, palpitations,  claudication, leg swelling and PND.  Gastrointestinal: Positive for constipation. Negative for heartburn, nausea, vomiting, abdominal pain, diarrhea, blood in stool and melena.  Genitourinary: Negative for dysuria, urgency, frequency and hematuria.  Musculoskeletal: Negative for myalgias, joint pain and falls.  Skin: Negative for itching and rash.  Neurological: Negative for dizziness, tingling, tremors, sensory change, speech change, focal weakness, seizures, loss of consciousness, weakness and headaches.  Endo/Heme/Allergies: Does not bruise/bleed easily.  Psychiatric/Behavioral: Negative for depression, suicidal ideas, memory loss and substance abuse. The patient is not nervous/anxious and does not have insomnia.     PHYSICAL EXAMINATION  ECOG PERFORMANCE STATUS: 0 - Asymptomatic  Filed Vitals:   10/28/14 1046  BP: 123/64  Pulse: 71  Temp: 97.6 F (36.4 C)  Resp: 18    Physical Exam  Constitutional: She is oriented to person, place, and time and well-developed, well-nourished, and in no distress.  HENT:  Head: Normocephalic and atraumatic.  Nose: Nose normal.  Mouth/Throat: Oropharynx is clear and moist. No oropharyngeal exudate.  Eyes: Conjunctivae and EOM are normal. Pupils are equal, round, and reactive to light. Right eye exhibits no discharge. Left eye exhibits no discharge. No scleral icterus.  Neck: Normal range of motion. Neck supple. No tracheal deviation present. No thyromegaly present.  Cardiovascular: Normal rate, regular rhythm and normal heart sounds.  Exam reveals no gallop and no friction rub.   No murmur heard. Pulmonary/Chest: Effort normal and breath sounds normal. She has no wheezes. She has no rales.  Abdominal: Soft. Bowel sounds are normal. She exhibits no distension and no mass. There is no tenderness. There is no rebound and no guarding.  Musculoskeletal: Normal range of motion. She exhibits no edema.  Lymphadenopathy:    She has no cervical  adenopathy.  Neurological: She is alert and oriented to person, place, and time. She has normal reflexes. No cranial nerve deficit. Gait normal. Coordination normal.  Skin: Skin is warm and dry. No rash noted.  Psychiatric: Mood, memory, affect and judgment normal.  Nursing note and vitals reviewed.   LABORATORY DATA:  CBC    Component Value  Date/Time   WBC 3.2* 10/28/2014 1023   RBC 4.09 10/28/2014 1023   RBC 4.62 12/17/2013 1444   HGB 12.5 10/28/2014 1023   HCT 39.3 10/28/2014 1023   PLT 216 10/28/2014 1023   MCV 96.1 10/28/2014 1023   MCH 30.6 10/28/2014 1023   MCHC 31.8 10/28/2014 1023   RDW 13.9 10/28/2014 1023   LYMPHSABS 1.5 10/28/2014 1023   MONOABS 0.3 10/28/2014 1023   EOSABS 0.0 10/28/2014 1023   BASOSABS 0.0 10/28/2014 1023   CMP     Component Value Date/Time   NA 141 10/28/2014 1023   K 3.8 10/28/2014 1023   CL 105 10/28/2014 1023   CO2 29 10/28/2014 1023   GLUCOSE 71 10/28/2014 1023   BUN 14 10/28/2014 1023   CREATININE 0.74 10/28/2014 1023   CALCIUM 9.5 10/28/2014 1023   PROT 7.4 10/28/2014 1023   ALBUMIN 4.2 10/28/2014 1023   AST 20 10/28/2014 1023   ALT 15 10/28/2014 1023   ALKPHOS 47 10/28/2014 1023   BILITOT 1.0 10/28/2014 1023   GFRNONAA >60 10/28/2014 1023   GFRAA >60 10/28/2014 1023       ASSESSMENT and THERAPY PLAN:   Iron deficiency anemia  Blood counts since receiving IV iron have remained normal. She is up-to-date on colonoscopy. She has problems with constipation and internal hemorrhoids. By her report she has had problems with vaginal bleeding but it has been evaluated by her gynecologist, Dr. Ulanda Edison. I do not see a ferritin on her blood work today and we will try to add this. In addition given her recent problems I would like to see her back in 3 months with repeat studies. If she has interim concerns or worsening of her baseline problems I have advised her to contact us.   All questions were answered. The patient knows to call  the clinic with any problems, questions or concerns. We can certainly see the patient much sooner if necessary. This note was electronically signed. 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  Gastroesophageal reflux disease with stricture, normal esophageal manometry, on long-term proton pump inhibitor therapy, status post dilatation.   Serum ferritin of 4.3 ng/ml 09/07/2013  CURRENT THERAPY: IV iron prn  INTERVAL HISTORY: BETHANNY TOELLE 74 y.o. female returns for follow-up of iron deficiency anemia. Her last CBC showed a normal H&H with normal indices. She has no other major complaints today.   She is present alone today, she has been having family problems. She notes that her current issues all revolve around family.   Last Mammogram, 12/15, [place]  She complains of being sore in her right lower back in the morning time, alleviated with a hot shower. She believes she has mild arthritis.   She denies pagophagia.  MEDICAL HISTORY: Past Medical History  Diagnosis Date  . Hypertension   . GERD (gastroesophageal reflux disease)     w/ HH (EGD 2010)  . 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 Stricture and stenosis of esophagus 2006  . Arthritis   . OSTEOPENIA   . Mixed hyperlipidemia   . LOW BACK PAIN, CHRONIC   . GLAUCOMA   . DEPRESSION, SITUATIONAL   . Chronic constipation   . ANXIETY   . Diabetes  mellitus dx 02/2013    has Mixed hyperlipidemia; ANXIETY; DEPRESSION, SITUATIONAL; GLAUCOMA; Essential hypertension; ESOPHAGEAL STRICTURE; HIATAL HERNIA; Irritable bowel syndrome; Dyspareunia; Arthropathy; LOW BACK PAIN, CHRONIC; OSTEOPENIA; Chronic constipation; GERD (gastroesophageal reflux disease); Frozen shoulder; DM type 2 (diabetes  mellitus, type 2); Internal hemorrhoids; Loss of weight; Iron deficiency anemia due to chronic blood loss; and Abscess of back on her problem list.     is allergic to codeine and hydrochlorothiazide.  Ms. Tuccillo had no medications administered during this visit.  SURGICAL HISTORY: Past Surgical History  Procedure Laterality Date  . Tubal ligation    . Back surgery    . Hemorroidectomy    . Rotator cuff repair      right   . Hysteroscopy w/d&c  07/20/2011    Procedure: DILATATION AND CURETTAGE /HYSTEROSCOPY;  Surgeon: Melina Schools, MD;  Location: [facility];  Service: Gynecology;  Laterality: N/A;  . Polypectomy  07/20/2011    Procedure: POLYPECTOMY;  Surgeon: Melina Schools, MD;  Location: [facility];  Service: Gynecology;  Laterality: N/A;  . Esophageal manometry N/A 02/09/2013    Procedure: ESOPHAGEAL MANOMETRY (EM);  Surgeon: Sable Feil, MD;  [facility];  Service: Endoscopy;  Laterality: N/A;    SOCIAL HISTORY: History   Social History  . Marital Status: Divorced    Spouse Name: N/A  . Number of Children: 5  . Years of Education: N/A   Occupational History  . retired    Social History Main Topics  . Smoking status: Never Smoker   . Smokeless tobacco: Never Used  . Alcohol Use: No  . Drug Use: No  . Sexual Activity: No   Other Topics Concern  . Not on file   Social History Narrative    FAMILY HISTORY: Family History  Problem Relation Age of Onset  . Heart failure Father   . Diabetes Mother   . Breast cancer Sister   . Colon cancer Sister 76  . Diabetes Brother     Review of Systems  Constitutional: Negative for fever, chills, weight loss and malaise/fatigue.  HENT: Negative for congestion, hearing loss, nosebleeds, sore throat and tinnitus.   Eyes: Negative for blurred vision, double vision, pain and discharge.  Respiratory: Negative for cough, hemoptysis, sputum production, shortness of breath and wheezing.   Cardiovascular: Negative for  chest pain, palpitations, claudication, leg swelling and PND.  Gastrointestinal: Negative for constipation. Negative for heartburn, nausea, vomiting, abdominal pain, diarrhea, blood in stool and melena.  Genitourinary: Negative for dysuria, urgency, frequency and hematuria.  Musculoskeletal: Negative for myalgias, joint pain and falls.  Skin: Negative for itching and rash.  Neurological: Negative for dizziness, tingling, tremors, sensory change, speech change, focal weakness, seizures, loss of consciousness, weakness and headaches.  Endo/Heme/Allergies: Does not bruise/bleed easily.  Psychiatric/Behavioral: Negative for depression, suicidal ideas, memory loss and substance abuse. The patient is not nervous/anxious and does not have insomnia.     PHYSICAL EXAMINATION  ECOG PERFORMANCE STATUS: 0 - Asymptomatic  Filed Vitals:   10/28/14 1046  BP: 123/64  Pulse: 71  Temp: 97.6 F (36.4 C)  Resp: 18    Physical Exam  Constitutional: She is oriented to person, place, and time and well-developed, well-nourished, and in no distress.  HENT:  Head: Normocephalic and atraumatic.  Nose: Nose normal.  Mouth/Throat: Oropharynx is clear and moist. No oropharyngeal exudate.  Eyes: Conjunctivae and EOM are normal. Pupils are equal, round, and reactive to light. Right eye exhibits no discharge. Left eye exhibits  no discharge. No scleral icterus.  Neck: Normal range of motion. Neck supple. No tracheal deviation present. No thyromegaly present.  Cardiovascular: Normal rate, regular rhythm and normal heart sounds.  Exam reveals no gallop and no friction rub.   No murmur heard. Pulmonary/Chest: Effort normal and breath sounds normal. She has no wheezes. She has no rales.  Abdominal: Soft. Bowel sounds are normal. She exhibits no distension and no mass. There is no tenderness. There is no rebound and no guarding.  Musculoskeletal: Normal range of motion. She exhibits no edema.  Lymphadenopathy:    She  has no cervical adenopathy.  Neurological: She is alert and oriented to person, place, and time. She has normal reflexes. No cranial nerve deficit. Gait normal. Coordination normal.  Skin: Skin is warm and dry. No rash noted.  Psychiatric: Mood, memory, affect and judgment normal.  Nursing note and vitals reviewed.   LABORATORY DATA:  CBC    Component Value Date/Time   WBC 3.2* 10/28/2014 1023   RBC 4.09 10/28/2014 1023   RBC 4.62 12/17/2013 1444   HGB 12.5 10/28/2014 1023   HCT 39.3 10/28/2014 1023   PLT 216 10/28/2014 1023   MCV 96.1 10/28/2014 1023   MCH 30.6 10/28/2014 1023   MCHC 31.8 10/28/2014 1023   RDW 13.9 10/28/2014 1023   LYMPHSABS 1.5 10/28/2014 1023   MONOABS 0.3 10/28/2014 1023   EOSABS 0.0 10/28/2014 1023   BASOSABS 0.0 10/28/2014 1023   CMP     Component Value Date/Time   NA 141 10/28/2014 1023   K 3.8 10/28/2014 1023   CL 105 10/28/2014 1023   CO2 29 10/28/2014 1023   GLUCOSE 71 10/28/2014 1023   BUN 14 10/28/2014 1023   CREATININE 0.74 10/28/2014 1023   CALCIUM 9.5 10/28/2014 1023   PROT 7.4 10/28/2014 1023   ALBUMIN 4.2 10/28/2014 1023   AST 20 10/28/2014 1023   ALT 15 10/28/2014 1023   ALKPHOS 47 10/28/2014 1023   BILITOT 1.0 10/28/2014 1023   GFRNONAA >60 10/28/2014 1023   GFRAA >60 10/28/2014 1023     ASSESSMENT and THERAPY PLAN:   Iron deficiency anemia  Blood counts since receiving IV iron have remained normal. She is up-to-date on colonoscopy. She has problems with constipation and internal hemorrhoids. By her report she has had problems with vaginal bleeding but it has been evaluated by her gynecologist, Dr. Ulanda Edison.  Ferritin today is pending. We will notify her of the results. If there is an indication for IV iron we will make her an appointment.  I will plan on seeing her back in 6 months with repeat labs and physical exam.  All questions were answered. The patient knows to call the clinic with any problems, questions or  concerns. We can certainly see the patient much sooner if necessary.   This note was electronically signed.  This document serves as a record of services personally performed by Ancil Linsey, MD. It was created on her behalf by Janace Hoard, a trained medical scribe. The creation of this record is based on the scribe's personal observations and the provider's statements to them. This document has been checked and approved by the attending provider.  I have reviewed the above documentation for accuracy and completeness, and I agree with the above.  Kelby Fam. Whitney Muse, MD

## 2014-10-28 NOTE — Patient Instructions (Signed)
..  [facility] at [facility] Discharge Instructions  RECOMMENDATIONS MADE BY THE CONSULTANT AND ANY TEST RESULTS WILL BE SENT TO YOUR REFERRING PHYSICIAN.  Exam per Dr. Penland Return in 6 months  Thank you for choosing  [facility] to provide your oncology and hematology care.  To afford each patient quality time with our provider, please arrive at least 15 minutes before your scheduled appointment time.    You need to re-schedule your appointment should you arrive 10 or more minutes late.  We strive to give you quality time with our providers, and arriving late affects you and other patients whose appointments are after yours.  Also, if you no show three or more times for appointments you may be dismissed from the clinic at the providers discretion.     Again, thank you for choosing [facility].  Our hope is that these requests will decrease the amount of time that you wait before being seen by our physicians.       _____________________________________________________________  Should you have questions after your visit to [facility], please contact our office at [phone] between the hours of 8:30 a.m. and 4:30 p.m.  Voicemails left after 4:30 p.m. will not be returned until the following business day.  For prescription refill requests, have your pharmacy contact our office.    

## 2014-10-29 NOTE — Progress Notes (Signed)
Lab draw

## 2014-11-22 ENCOUNTER — Other Ambulatory Visit: Payer: Self-pay | Admitting: Internal Medicine

## 2014-12-14 ENCOUNTER — Ambulatory Visit (INDEPENDENT_AMBULATORY_CARE_PROVIDER_SITE_OTHER): Payer: Commercial Managed Care - HMO | Admitting: Internal Medicine

## 2014-12-14 ENCOUNTER — Other Ambulatory Visit (INDEPENDENT_AMBULATORY_CARE_PROVIDER_SITE_OTHER): Payer: PRIVATE HEALTH INSURANCE

## 2014-12-14 ENCOUNTER — Encounter: Payer: Self-pay | Admitting: Internal Medicine

## 2014-12-14 VITALS — BP 130/78 | HR 65 | Temp 98.1°F | Resp 14 | Ht 66.0 in | Wt 133.1 lb

## 2014-12-14 DIAGNOSIS — R634 Abnormal weight loss: Secondary | ICD-10-CM

## 2014-12-14 DIAGNOSIS — E119 Type 2 diabetes mellitus without complications: Secondary | ICD-10-CM

## 2014-12-14 LAB — HEMOGLOBIN A1C: Hgb A1c MFr Bld: 6.3 % (ref 4.6–6.5)

## 2014-12-14 NOTE — Progress Notes (Signed)
Pre visit review using our clinic review tool, if applicable. No additional management support is needed unless otherwise documented below in the visit note. 

## 2014-12-14 NOTE — Progress Notes (Signed)
   Subjective:    Patient ID: Monica [name], female    DOB: 1940-07-21, 74 y.o.   MRN: [id number]  HPI The patient is a 74 YO female coming in for follow up of her blood pressure and her diabetes. She wants to have a blood pressure monitor at home in case she feels bad she can check it. She has had headaches in the last week but BP normal today. Still taking her medicines. No dizziness or lightheadedness. No chest pains. She is doing well with her sugars but is concerned that metformin could be causing her to lose weight. She is down about 3 pounds from last visit and does not want to continue losing weight. She does eat okay. She has had extra stress in her life recently and is estranged from her daughter in law and granddaughter.   Review of Systems  Constitutional: Negative.   Respiratory: Negative.   Cardiovascular: Negative.   Gastrointestinal: Negative.   Skin: Negative.   Neurological: Positive for headaches. Negative for dizziness, weakness and light-headedness.  Psychiatric/Behavioral: Positive for dysphoric mood.      Objective:   Physical Exam  Constitutional: She is oriented to person, place, and time. She appears well-developed and well-nourished.  HENT:  Head: Normocephalic and atraumatic.  Right Ear: External ear normal.  Left Ear: External ear normal.  Eyes: EOM are normal.  Neck: Normal range of motion.  Cardiovascular: Normal rate and regular rhythm.   Pulmonary/Chest: Effort normal and breath sounds normal.  Abdominal: Soft.  Neurological: She is alert and oriented to person, place, and time.  Skin: Skin is warm and dry.   Filed Vitals:   12/14/14 0934  BP: 130/78  Pulse: 65  Temp: 98.1 F (36.7 C)  TempSrc: Oral  Resp: 14  Height: 5\' 6"  (1.676 m)  Weight: 133 lb 1.9 oz (60.383 kg)  SpO2: 92%      Assessment & Plan:

## 2014-12-14 NOTE — Assessment & Plan Note (Signed)
She was concerned about not having enough groceries. Given info about food pantries and given coupon to try boost. Will continue to monitor. No signs or symptoms of source. Up to date on recommended cancer screening.

## 2014-12-14 NOTE — Patient Instructions (Signed)
We will check the blood work today and call you back about the results.   If you need food there are many food pantries in town including the [facility] where you can get food 4 times per year. You do need to bring ID with you. It is located at [place], [place],  [address] Phone: [phone]  We have also given you some coupons to try some boost.   If the blood work comes back good we will stop the metformin.

## 2014-12-14 NOTE — Assessment & Plan Note (Signed)
Checking HgA1c but if still <6.5 will stop her metformin. Not on ACE-I or ARB at this time and will not add today.

## 2014-12-16 ENCOUNTER — Telehealth: Payer: Self-pay | Admitting: Internal Medicine

## 2014-12-16 NOTE — Telephone Encounter (Signed)
Patient is returning your call.  

## 2015-01-08 ENCOUNTER — Other Ambulatory Visit: Payer: Self-pay | Admitting: Internal Medicine

## 2015-02-15 DIAGNOSIS — H5210 Myopia, unspecified eye: Secondary | ICD-10-CM | POA: Diagnosis not present

## 2015-02-15 DIAGNOSIS — H40009 Preglaucoma, unspecified, unspecified eye: Secondary | ICD-10-CM | POA: Diagnosis not present

## 2015-02-15 DIAGNOSIS — H5200 Hypermetropia, unspecified eye: Secondary | ICD-10-CM | POA: Diagnosis not present

## 2015-02-15 DIAGNOSIS — E109 Type 1 diabetes mellitus without complications: Secondary | ICD-10-CM | POA: Diagnosis not present

## 2015-02-15 DIAGNOSIS — I10 Essential (primary) hypertension: Secondary | ICD-10-CM | POA: Diagnosis not present

## 2015-02-28 DIAGNOSIS — Z0100 Encounter for examination of eyes and vision without abnormal findings: Secondary | ICD-10-CM | POA: Diagnosis not present

## 2015-04-08 ENCOUNTER — Other Ambulatory Visit: Payer: Self-pay | Admitting: Internal Medicine

## 2015-04-11 ENCOUNTER — Ambulatory Visit (INDEPENDENT_AMBULATORY_CARE_PROVIDER_SITE_OTHER): Payer: Commercial Managed Care - HMO | Admitting: Internal Medicine

## 2015-04-11 ENCOUNTER — Encounter: Payer: Self-pay | Admitting: Internal Medicine

## 2015-04-11 VITALS — BP 122/76 | HR 64 | Temp 98.2°F | Resp 20 | Ht 66.0 in | Wt 140.0 lb

## 2015-04-11 DIAGNOSIS — M545 Low back pain, unspecified: Secondary | ICD-10-CM

## 2015-04-11 NOTE — Patient Instructions (Signed)
Start doing back exercises and regular exercise as well.   Do not take any more ibuprofen.  Try tylenol arthritis for your pain - take according to the bottle.    Return if there is no improvement or any worsening.

## 2015-04-11 NOTE — Progress Notes (Signed)
Pre visit review using our clinic review tool, if applicable. No additional management support is needed unless otherwise documented below in the visit note. 

## 2015-04-11 NOTE — Progress Notes (Signed)
  Subjective:    Patient ID: Monica [name], female    DOB: 02/24/1941, 74 y.o.   MRN: [id number]  HPI She is here for an acute visit for back pain.  She has had back pain in the past.  She had surgery years ago.  Months ago she was sewing and stood up and then went to sit down and missed the chair.  This was a while ago.  She was seen at that time and given pain medications.  Her pain improved.    Recently she has had increased difficulty getting out of bed.  She thinks she has arthritis in her back and with the colder weather the back pain and stiffness has gotten worse.  She is very stiff in the morning and has difficulty getting up out of bed.  She takes 400 mg of ibuprofen at night and it helps a little.  Once she gets up and gets moving and has the warm water from the shower she feels good.   If she lays down or sits down for a period of time the back stiffens up again.  She denies radiation of the pain or numbness/tingling.   She is not exercising regualarly.    Xray lumbar spine 07/29/13: IMPRESSION:  Stable mild lower lumbar spondylosis. No acute osseous findings or malalignment.  Medications and allergies reviewed with patient and updated if appropriate.  Patient Active Problem List   Diagnosis Date Noted  . Iron deficiency anemia due to chronic blood loss 02/05/2014  . Loss of weight 07/29/2013  . Internal hemorrhoids 07/02/2013  . DM type 2 (diabetes mellitus, type 2) (HCC) 03/09/2013  . 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 Chronic constipation 02/27/2011  . GERD (gastroesophageal reflux disease) 02/27/2011  . Mixed hyperlipidemia 01/14/2009  . ANXIETY 12/15/2008  . DEPRESSION, SITUATIONAL 12/03/2008  . ESOPHAGEAL STRICTURE 11/11/2008  . HIATAL HERNIA 11/11/2008  . Dyspareunia 12/29/2007  . Essential hypertension 12/01/2007  . OSTEOPENIA 09/12/2007  . GLAUCOMA 05/07/2007  . Arthropathy 05/07/2007    Current Outpatient Prescriptions on File Prior to Visit  Medication Sig  Dispense Refill  . amLODipine (NORVASC) 5 MG tablet TAKE ONE TABLET BY MOUTH ONCE DAILY 90 tablet 3  . aspirin 81 MG tablet Take 81 mg by mouth daily.      . atorvastatin (LIPITOR) 10 MG tablet TAKE ONE TABLET BY MOUTH ONCE DAILY 90 tablet 3  . Blood Glucose Monitoring Suppl (ONE TOUCH ULTRA 2) W/DEVICE KIT Use to check blood sugar every morning 1 each 0  . Cholecalciferol (VITAMIN D3) 1000 UNITS CAPS Take 1,000 Units by mouth daily.    . conjugated estrogens (PREMARIN) vaginal cream One applicator full placed in the vagina twice a week. 42.5 g 12  . dexlansoprazole (DEXILANT) 60 MG capsule Take 1 capsule (60 mg total) by mouth daily. 90 capsule 0  . glucose blood (ONE TOUCH ULTRA TEST) test strip 1 each by Other route daily. 100 each 5  . metFORMIN (GLUCOPHAGE) 500 MG tablet TAKE ONE TABLET BY MOUTH ONCE DAILY WITH BREAKFAST 90 tablet 3  . naproxen (NAPROSYN) 500 MG tablet Take 1 tablet (500 mg total) by mouth 2 (two) times daily with a meal. 40 tablet 0  . ONETOUCH DELICA LANCETS 33G MISC Use one lancet to check blood sugar in the morning before breakfast. 100 each 3  . polyethylene glycol powder (GLYCOLAX/MIRALAX) powder Take 17 g by mouth daily. 3350 g 1   No current facility-administered medications on file prior to   visit.    Past Medical History  Diagnosis Date  . Hypertension   . GERD (gastroesophageal reflux disease)     w/ HH (EGD 2010)  . 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 Stricture and stenosis of esophagus 2006  . Arthritis   . OSTEOPENIA   . Mixed hyperlipidemia   . LOW BACK PAIN, CHRONIC   . GLAUCOMA   . DEPRESSION, SITUATIONAL   . Chronic constipation   . ANXIETY   . Diabetes mellitus ([place]) dx 02/2013    Past Surgical History  Procedure Laterality Date  . Tubal ligation    . Back surgery    . Hemorroidectomy    . Rotator cuff repair      right   . Hysteroscopy w/d&c  07/20/2011    Procedure: DILATATION AND CURETTAGE /HYSTEROSCOPY;  Surgeon: Melina Schools, MD;  Location: [facility];  Service: Gynecology;  Laterality: N/A;  . Polypectomy  07/20/2011    Procedure: POLYPECTOMY;  Surgeon: Melina Schools, MD;  Location: [facility];  Service: Gynecology;  Laterality: N/A;  . Esophageal manometry N/A 02/09/2013    Procedure: ESOPHAGEAL MANOMETRY (EM);  Surgeon: Sable Feil, MD;  Location: [facility];  Service: Endoscopy;  Laterality: N/A;    Social History   Social History  . Marital Status: Divorced    Spouse Name: N/A  . Number of Children: 5  . Years of Education: N/A   Occupational History  . retired    Social History Main Topics  . Smoking status: Never Smoker   . Smokeless tobacco: Never Used  . Alcohol Use: No  . Drug Use: No  . Sexual Activity: No   Other Topics Concern  . None   Social History Narrative    Review of Systems  Constitutional: Negative for fever and chills.  Genitourinary:       No change in urination/bowel movements - incontinence  Musculoskeletal: Positive for back pain. Negative for myalgias and gait problem.  Neurological: Negative for weakness and numbness.       Objective:   Filed Vitals:   04/11/15 0838  BP: 122/76  Pulse: 64  Temp: 98.2 F (36.8 C)  Resp: 20   Filed Weights   04/11/15 0838  Weight: 140 lb (63.504 kg)   Body mass index is 22.61 kg/(m^2).   Physical Exam  Constitutional: She appears well-developed and well-nourished. No distress.  Musculoskeletal: She exhibits no edema.  Area of pain is across lower back-nontender to palpation  Neurological: She exhibits normal muscle tone. Coordination normal.  Normal sensation and strength bilateral lower extremities  Skin:  Healed lumbar surgical scar          Assessment & Plan:   Lower back pain, without sciatica Stiffness and mild lower back pain She had an x-ray just over a year ago at this point and do not think repeat imaging is necessary She can continue the ibuprofen, but I advised her not to increase it  further Start Tylenol arthritis-take according to all instructions She is back exercises at home that she has done in the past and I encouraged her to start doing them as well as regular exercise If no improvement call or return

## 2015-04-18 ENCOUNTER — Encounter: Payer: Self-pay | Admitting: Gastroenterology

## 2015-04-28 ENCOUNTER — Encounter (HOSPITAL_COMMUNITY): Payer: Medicare HMO | Attending: Hematology & Oncology | Admitting: Hematology & Oncology

## 2015-04-28 ENCOUNTER — Encounter (HOSPITAL_BASED_OUTPATIENT_CLINIC_OR_DEPARTMENT_OTHER): Payer: Medicare HMO

## 2015-04-28 ENCOUNTER — Encounter (HOSPITAL_COMMUNITY): Payer: Self-pay | Admitting: Hematology & Oncology

## 2015-04-28 VITALS — BP 144/74 | HR 62 | Temp 97.8°F | Resp 20 | Wt 139.4 lb

## 2015-04-28 DIAGNOSIS — D500 Iron deficiency anemia secondary to blood loss (chronic): Secondary | ICD-10-CM | POA: Diagnosis not present

## 2015-04-28 DIAGNOSIS — R3915 Urgency of urination: Secondary | ICD-10-CM | POA: Diagnosis not present

## 2015-04-28 DIAGNOSIS — D509 Iron deficiency anemia, unspecified: Secondary | ICD-10-CM | POA: Diagnosis not present

## 2015-04-28 LAB — CBC WITH DIFFERENTIAL/PLATELET
BASOS ABS: 0 10*3/uL (ref 0.0–0.1)
Basophils Relative: 1 %
Eosinophils Absolute: 0 10*3/uL (ref 0.0–0.7)
Eosinophils Relative: 0 %
HEMATOCRIT: 39.4 % (ref 36.0–46.0)
HEMOGLOBIN: 12.8 g/dL (ref 12.0–15.0)
LYMPHS PCT: 51 %
Lymphs Abs: 1.6 10*3/uL (ref 0.7–4.0)
MCH: 30.6 pg (ref 26.0–34.0)
MCHC: 32.5 g/dL (ref 30.0–36.0)
MCV: 94.3 fL (ref 78.0–100.0)
Monocytes Absolute: 0.2 10*3/uL (ref 0.1–1.0)
Monocytes Relative: 6 %
NEUTROS ABS: 1.3 10*3/uL — AB (ref 1.7–7.7)
NEUTROS PCT: 42 %
Platelets: 219 10*3/uL (ref 150–400)
RBC: 4.18 MIL/uL (ref 3.87–5.11)
RDW: 14.1 % (ref 11.5–15.5)
WBC: 3.1 10*3/uL — AB (ref 4.0–10.5)

## 2015-04-28 LAB — IRON AND TIBC
IRON: 65 ug/dL (ref 28–170)
SATURATION RATIOS: 23 % (ref 10.4–31.8)
TIBC: 286 ug/dL (ref 250–450)
UIBC: 221 ug/dL

## 2015-04-28 LAB — FERRITIN: Ferritin: 128 ng/mL (ref 11–307)

## 2015-04-28 NOTE — Progress Notes (Signed)
LABS DRAWN

## 2015-04-28 NOTE — Patient Instructions (Addendum)
[place] at [facility] Discharge Instructions  RECOMMENDATIONS MADE BY THE CONSULTANT AND ANY TEST RESULTS WILL BE SENT TO YOUR REFERRING PHYSICIAN.  Exam completed by Dr Whitney Muse today Iron levels are still pending we will call you with these results  Return to see the doctor in 6 months with lab work Please call the clinic if you have any questions or concerns   Thank you for choosing [facility] at [facility] to provide your oncology and hematology care.  To afford each patient quality time with our provider, please arrive at least 15 minutes before your scheduled appointment time.    You need to re-schedule your appointment should you arrive 10 or more minutes late.  We strive to give you quality time with our providers, and arriving late affects you and other patients whose appointments are after yours.  Also, if you no show three or more times for appointments you may be dismissed from the clinic at the providers discretion.     Again, thank you for choosing [facility].  Our hope is that these requests will decrease the amount of time that you wait before being seen by our physicians.       _____________________________________________________________  Should you have questions after your visit to [facility], please contact our office at [phone] between the hours of 8:30 a.m. and 4:30 p.m.  Voicemails left after 4:30 p.m. will not be returned until the following business day.  For prescription refill requests, have your pharmacy contact our office.

## 2015-04-28 NOTE — Progress Notes (Signed)
Monica Grant, MD [address] [facility] [address] [place] [address]   DIAGNOSIS: Iron deficiency anemia secondary to chronic GI related blood loss Serum ferritin of 4.3 ng/ml 09/07/2013 Gastroesophageal reflux disease with stricture, normal esophageal manometry, on long-term proton pump inhibitor therapy, status post dilatation. Leukopenia  CURRENT THERAPY: IV iron prn  INTERVAL HISTORY: Monica [name] 74 y.o. female returns for follow-up of iron deficiency anemia. Her last CBC showed a normal H&H with normal indices. She has no other major complaints today.   She is present alone today, she has been having family problems. She notes that her current issues all revolve around family.   She is due for a mammogram.   Denies blood in stool. Notes that her appetite is good. Energy is baseline.  She has problems with urinary urgency and notes that she fell recently while running to get to the bathroom. She states this is a chronic issue.   No pagophagia.  MEDICAL HISTORY: Past Medical History  Diagnosis Date  . Hypertension   . GERD (gastroesophageal reflux disease)     w/ HH (EGD 2010)  . 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 Stricture and stenosis of esophagus 2006  . Arthritis   . OSTEOPENIA   . Mixed hyperlipidemia   . LOW BACK PAIN, CHRONIC   . GLAUCOMA   . DEPRESSION, SITUATIONAL   . Chronic constipation   . ANXIETY   . Diabetes mellitus ([place]) dx 02/2013    has Mixed hyperlipidemia; ANXIETY; DEPRESSION, SITUATIONAL; GLAUCOMA; Essential hypertension; ESOPHAGEAL STRICTURE; HIATAL HERNIA; Dyspareunia; Arthropathy; OSTEOPENIA; Chronic constipation; GERD (gastroesophageal reflux disease); Frozen shoulder; DM type 2 (diabetes mellitus, type 2) ([place]); Internal hemorrhoids; Loss of weight; and Iron deficiency anemia due to chronic blood loss on her problem list.     is allergic to codeine and hydrochlorothiazide.  Ms. Granville had no medications  administered during this visit.  SURGICAL HISTORY: Past Surgical History  Procedure Laterality Date  . Tubal ligation    . Back surgery    . Hemorroidectomy    . Rotator cuff repair      right   . Hysteroscopy w/d&c  07/20/2011    Procedure: DILATATION AND CURETTAGE /HYSTEROSCOPY;  Surgeon: Melina Schools, MD;  Location: [facility];  Service: Gynecology;  Laterality: N/A;  . Polypectomy  07/20/2011    Procedure: POLYPECTOMY;  Surgeon: Melina Schools, MD;  Location: [facility];  Service: Gynecology;  Laterality: N/A;  . Esophageal manometry N/A 02/09/2013    Procedure: ESOPHAGEAL MANOMETRY (EM);  Surgeon: Sable Feil, MD;  Location: [facility];  Service: Endoscopy;  Laterality: N/A;    SOCIAL HISTORY: Social History   Social History  . Marital Status: Divorced    Spouse Name: N/A  . Number of Children: 5  . Years of Education: N/A   Occupational History  . retired    Social History Main Topics  . Smoking status: Never Smoker   . Smokeless tobacco: Never Used  . Alcohol Use: No  . Drug Use: No  . Sexual Activity: No   Other Topics Concern  . Not on file   Social History Narrative    FAMILY HISTORY: Family History  Problem Relation Age of Onset  . Heart failure Father   . Diabetes Mother   . Breast cancer Sister   . Colon cancer Sister 52  . Diabetes Brother     Review of Systems  Constitutional:  Negative for fever, chills, weight loss and malaise/fatigue.  HENT: Negative for congestion, hearing loss, nosebleeds, sore throat and tinnitus.   Eyes: Negative for blurred vision, double vision, pain and discharge.  Respiratory: Negative for cough, hemoptysis, sputum production, shortness of breath and wheezing.   Cardiovascular: Negative for chest pain, palpitations, claudication, leg swelling and PND.  Gastrointestinal: Negative for constipation. Negative for heartburn, nausea, vomiting, abdominal pain, diarrhea, blood in stool and melena.  Genitourinary: Positive  for urgency. Negative for dysuria, frequency and hematuria.      Urinary urgency associated with aging. Musculoskeletal: Negative for myalgias, joint pain and falls.  Skin: Negative for itching and rash.  Neurological: Negative for dizziness, tingling, tremors, sensory change, speech change, focal weakness, seizures, loss of consciousness, weakness and headaches.  Endo/Heme/Allergies: Does not bruise/bleed easily.  Psychiatric/Behavioral: Negative for depression, suicidal ideas, memory loss and substance abuse. The patient is not nervous/anxious and does not have insomnia.     PHYSICAL EXAMINATION  ECOG PERFORMANCE STATUS: 0 - Asymptomatic  Filed Vitals:   04/28/15 1113  BP: 144/74  Pulse: 62  Temp: 97.8 F (36.6 C)  Resp: 20    Physical Exam  Constitutional: She is oriented to person, place, and time and well-developed, well-nourished, and in no distress.  HENT:  Head: Normocephalic and atraumatic.  Nose: Nose normal.  Mouth/Throat: Oropharynx is clear and moist. No oropharyngeal exudate.  Eyes: Conjunctivae and EOM are normal. Pupils are equal, round, and reactive to light. Right eye exhibits no discharge. Left eye exhibits no discharge. No scleral icterus.  Neck: Normal range of motion. Neck supple. No tracheal deviation present. No thyromegaly present.  Cardiovascular: Normal rate, regular rhythm and normal heart sounds.  Exam reveals no gallop and no friction rub.   No murmur heard. Pulmonary/Chest: Effort normal and breath sounds normal. She has no wheezes. She has no rales.  Abdominal: Soft. Bowel sounds are normal. She exhibits no distension and no mass. There is no tenderness. There is no rebound and no guarding.  Musculoskeletal: Normal range of motion. She exhibits no edema.  Lymphadenopathy:    She has no cervical adenopathy.  Neurological: She is alert and oriented to person, place, and time. She has normal reflexes. No cranial nerve deficit. Gait normal.  Coordination normal.  Skin: Skin is warm and dry. No rash noted.  Psychiatric: Mood, memory, affect and judgment normal.  Nursing note and vitals reviewed.   LABORATORY DATA: I have reviewed the data as listed. CBC    Component Value Date/Time   WBC 3.1* 04/28/2015 1024   RBC 4.18 04/28/2015 1024   RBC 4.62 12/17/2013 1444   HGB 12.8 04/28/2015 1024   HCT 39.4 04/28/2015 1024   PLT 219 04/28/2015 1024   MCV 94.3 04/28/2015 1024   MCH 30.6 04/28/2015 1024   MCHC 32.5 04/28/2015 1024   RDW 14.1 04/28/2015 1024   LYMPHSABS 1.6 04/28/2015 1024   MONOABS 0.2 04/28/2015 1024   EOSABS 0.0 04/28/2015 1024   BASOSABS 0.0 04/28/2015 1024   CMP     Component Value Date/Time   NA 141 10/28/2014 1023   K 3.8 10/28/2014 1023   CL 105 10/28/2014 1023   CO2 29 10/28/2014 1023   GLUCOSE 71 10/28/2014 1023   BUN 14 10/28/2014 1023   CREATININE 0.74 10/28/2014 1023   CALCIUM 9.5 10/28/2014 1023   PROT 7.4 10/28/2014 1023   ALBUMIN 4.2 10/28/2014 1023   AST 20 10/28/2014 1023   ALT 15 10/28/2014 1023  ALKPHOS 47 10/28/2014 1023   BILITOT 1.0 10/28/2014 1023   GFRNONAA >60 10/28/2014 1023   GFRAA >60 10/28/2014 1023     ASSESSMENT and THERAPY PLAN:   Iron deficiency anemia  Blood counts since receiving IV iron have remained normal. She is up-to-date on colonoscopy.  Ferritin today is pending. We will notify her of the results. If there is an indication for IV iron we will make her an appointment. Goal is to keep serum ferritin greater than 100.  I will plan on seeing her back in 6 months with repeat labs and physical exam.  All questions were answered. The patient knows to call the clinic with any problems, questions or concerns. We can certainly see the patient much sooner if necessary.   This note was electronically signed.  This document serves as a record of services personally performed by Ancil Linsey, MD. It was created on her behalf by Arlyce Harman, a trained  medical scribe. The creation of this record is based on the scribe's personal observations and the provider's statements to them. This document has been checked and approved by the attending provider.  I have reviewed the above documentation for accuracy and completeness, and I agree with the above.  Kelby Fam. Whitney Muse, MD

## 2015-05-25 DIAGNOSIS — Z130 Encounter for screening for diseases of the blood and blood-forming organs and certain disorders involving the immune mechanism: Secondary | ICD-10-CM | POA: Diagnosis not present

## 2015-05-25 DIAGNOSIS — Z1389 Encounter for screening for other disorder: Secondary | ICD-10-CM | POA: Diagnosis not present

## 2015-05-25 DIAGNOSIS — Z1231 Encounter for screening mammogram for malignant neoplasm of breast: Secondary | ICD-10-CM | POA: Diagnosis not present

## 2015-05-25 DIAGNOSIS — Z01419 Encounter for gynecological examination (general) (routine) without abnormal findings: Secondary | ICD-10-CM | POA: Diagnosis not present

## 2015-05-25 DIAGNOSIS — D259 Leiomyoma of uterus, unspecified: Secondary | ICD-10-CM | POA: Diagnosis not present

## 2015-06-08 ENCOUNTER — Encounter: Payer: Self-pay | Admitting: Nurse Practitioner

## 2015-06-08 ENCOUNTER — Ambulatory Visit (INDEPENDENT_AMBULATORY_CARE_PROVIDER_SITE_OTHER): Payer: Commercial Managed Care - HMO | Admitting: Nurse Practitioner

## 2015-06-08 VITALS — BP 138/88 | HR 87 | Temp 98.0°F | Resp 16 | Ht 66.0 in | Wt 137.0 lb

## 2015-06-08 DIAGNOSIS — J029 Acute pharyngitis, unspecified: Secondary | ICD-10-CM | POA: Diagnosis not present

## 2015-06-08 NOTE — Assessment & Plan Note (Signed)
New onset Pt was advised it is not likely viral and antibiotics are not needed today. She was agreeable. Will try OTC measures for sore throat and let us know if symptoms worsen or fever of 101.  FU prn worsening/failure to improve.

## 2015-06-08 NOTE — Progress Notes (Signed)
Pre visit review using our clinic review tool, if applicable. No additional management support is needed unless otherwise documented below in the visit note. 

## 2015-06-08 NOTE — Patient Instructions (Signed)
You can try menthylated cough drops  Chloraseptic spray or lozenges over the counter   Pharyngitis Pharyngitis is redness, pain, and swelling (inflammation) of your pharynx.  CAUSES  Pharyngitis is usually caused by infection. Most of the time, these infections are from viruses (viral) and are part of a cold. However, sometimes pharyngitis is caused by bacteria (bacterial). Pharyngitis can also be caused by allergies. Viral pharyngitis may be spread from person to person by coughing, sneezing, and personal items or utensils (cups, forks, spoons, toothbrushes). Bacterial pharyngitis may be spread from person to person by more intimate contact, such as kissing.  SIGNS AND SYMPTOMS  Symptoms of pharyngitis include:   Sore throat.   Tiredness (fatigue).   Low-grade fever.   Headache.  Joint pain and muscle aches.  Skin rashes.  Swollen lymph nodes.  Plaque-like film on throat or tonsils (often seen with bacterial pharyngitis). DIAGNOSIS  Your health care provider will ask you questions about your illness and your symptoms. Your medical history, along with a physical exam, is often all that is needed to diagnose pharyngitis. Sometimes, a rapid strep test is done. Other lab tests may also be done, depending on the suspected cause.  TREATMENT  Viral pharyngitis will usually get better in 3-4 days without the use of medicine. Bacterial pharyngitis is treated with medicines that kill germs (antibiotics).  HOME CARE INSTRUCTIONS   Drink enough water and fluids to keep your urine clear or pale yellow.   Only take over-the-counter or prescription medicines as directed by your health care provider:   If you are prescribed antibiotics, make sure you finish them even if you start to feel better.   Do not take aspirin.   Get lots of rest.   Gargle with 8 oz of salt water ( tsp of salt per 1 qt of water) as often as every 1-2 hours to soothe your throat.   Throat lozenges (if you  are not at risk for choking) or sprays may be used to soothe your throat. SEEK MEDICAL CARE IF:   You have large, tender lumps in your neck.  You have a rash.  You cough up green, yellow-brown, or bloody spit. SEEK IMMEDIATE MEDICAL CARE IF:   Your neck becomes stiff.  You drool or are unable to swallow liquids.  You vomit or are unable to keep medicines or liquids down.  You have severe pain that does not go away with the use of recommended medicines.  You have trouble breathing (not caused by a stuffy nose). MAKE SURE YOU:   Understand these instructions.  Will watch your condition.  Will get help right away if you are not doing well or get worse.   This information is not intended to replace advice given to you by your health care provider. Make sure you discuss any questions you have with your health care provider.   Document Released: 04/30/2005 Document Revised: 02/18/2013 Document Reviewed: 01/05/2013 Elsevier Interactive Patient Education [employer].

## 2015-06-08 NOTE — Progress Notes (Signed)
Patient ID: Monica [name], female    DOB: Aug 15, 1940  Age: 75 y.o. MRN: [id number]  CC: Sore Throat   HPI Monica [name] presents for CC of ST x 4 days.   1) Chills, ST, coug- dry, burning sensation in throat and  Burning with eating   Sick contacts- [employer] members  Treatment to date: Gargling with salt water and peroxide  Dexilant - yesterday   Cereal with warm milk   History Monica [name] has a past medical history of Hypertension; GERD (gastroesophageal reflux disease); Colon polyp (2011); Stricture and stenosis of esophagus (2006); Arthritis; OSTEOPENIA; Mixed hyperlipidemia; LOW BACK PAIN, CHRONIC; GLAUCOMA; DEPRESSION, SITUATIONAL; Chronic constipation; ANXIETY; and Diabetes mellitus ([place]) (dx 02/2013).   She has past surgical history that includes Tubal ligation; Back surgery; Hemorroidectomy; Rotator cuff repair; Hysteroscopy w/D&C (07/20/2011); polypectomy (07/20/2011); and Esophageal manometry (N/A, 02/09/2013).   Her family history includes Breast cancer in her sister; Colon cancer (age of onset: 54) in her sister; Diabetes in her brother and mother; Heart failure in her father.She reports that she has never smoked. She has never used smokeless tobacco. She reports that she does not drink alcohol or use illicit drugs.  Outpatient Prescriptions Prior to Visit  Medication Sig Dispense Refill  . amLODipine (NORVASC) 5 MG tablet TAKE ONE TABLET BY MOUTH ONCE DAILY 90 tablet 3  . aspirin 81 MG tablet Take 81 mg by mouth daily.      [name] atorvastatin (LIPITOR) 10 MG tablet TAKE ONE TABLET BY MOUTH ONCE DAILY 90 tablet 3  . Blood Glucose Monitoring Suppl (ONE TOUCH ULTRA 2) W/DEVICE KIT Use to check blood sugar every morning 1 each 0  . Cholecalciferol (VITAMIN D3) 1000 UNITS CAPS Take 1,000 Units by mouth daily.    [name] dexlansoprazole (DEXILANT) 60 MG capsule Take 1 capsule (60 mg total) by mouth daily. Must establish with NEW PCP for additional refills. 90 capsule 1  . glucose blood (ONE TOUCH  ULTRA TEST) test strip 1 each by Other route daily. 100 each 5  . ONETOUCH DELICA LANCETS 97C MISC Use one lancet to check blood sugar in the morning before breakfast. 100 each 3  . polyethylene glycol powder (GLYCOLAX/MIRALAX) powder Take 17 g by mouth daily. 3350 g 1  . conjugated estrogens (PREMARIN) vaginal cream One applicator full placed in the vagina twice a week. 42.5 g 12   No facility-administered medications prior to visit.    ROS Review of Systems  Constitutional: Negative for fever, chills, diaphoresis and fatigue.  Respiratory: Negative for chest tightness, shortness of breath and wheezing.   Cardiovascular: Negative for chest pain, palpitations and leg swelling.  Gastrointestinal: Negative for nausea, vomiting and diarrhea.  Skin: Negative for rash.  Neurological: Negative for dizziness and headaches.    Objective:  BP 138/88 mmHg  Pulse 87  Temp(Src) 98 F (36.7 C) (Oral)  Resp 16  Ht 5' 6" (1.676 m)  Wt 137 lb (62.143 kg)  BMI 22.12 kg/m2  SpO2 97%  Physical Exam  Constitutional: She is oriented to person, place, and time. She appears well-developed and well-nourished. No distress.  HENT:  Head: Normocephalic and atraumatic.  Right Ear: External ear normal.  Left Ear: External ear normal.  Mouth/Throat: Oropharynx is clear and moist. No oropharyngeal exudate.  TM's blocked by cerumen  Tonsils 2-3+    Neck: Normal range of motion. Neck supple.  Cardiovascular: Normal rate, regular rhythm and normal heart sounds.  Exam reveals no gallop and no friction rub.  No murmur heard. Pulmonary/Chest: Effort normal and breath sounds normal. No respiratory distress. She has no wheezes. She has no rales. She exhibits no tenderness.  Lymphadenopathy:    She has no cervical adenopathy.  Neurological: She is alert and oriented to person, place, and time. No cranial nerve deficit. She exhibits normal muscle tone. Coordination normal.  Skin: Skin is warm and dry. No rash  noted. She is not diaphoretic.  Psychiatric: She has a normal mood and affect. Her behavior is normal. Judgment and thought content normal.   Assessment & Plan:   Monica [name] was seen today for sore throat.  Diagnoses and all orders for this visit:  Viral pharyngitis   I have discontinued Monica [name]'s conjugated estrogens. I am also having her maintain her aspirin, ONE TOUCH ULTRA 2, polyethylene glycol powder, Vitamin D3, glucose blood, amLODipine, atorvastatin, ONETOUCH DELICA LANCETS 11X, and dexlansoprazole.  No orders of the defined types were placed in this encounter.     Follow-up: Return if symptoms worsen or fail to improve.

## 2015-06-16 ENCOUNTER — Encounter: Payer: Self-pay | Admitting: Internal Medicine

## 2015-06-16 ENCOUNTER — Other Ambulatory Visit (INDEPENDENT_AMBULATORY_CARE_PROVIDER_SITE_OTHER): Payer: Commercial Managed Care - HMO

## 2015-06-16 ENCOUNTER — Ambulatory Visit (INDEPENDENT_AMBULATORY_CARE_PROVIDER_SITE_OTHER): Payer: Commercial Managed Care - HMO | Admitting: Internal Medicine

## 2015-06-16 VITALS — BP 150/78 | HR 69 | Temp 98.4°F | Resp 16 | Ht 66.0 in | Wt 139.4 lb

## 2015-06-16 DIAGNOSIS — J029 Acute pharyngitis, unspecified: Secondary | ICD-10-CM | POA: Diagnosis not present

## 2015-06-16 DIAGNOSIS — R32 Unspecified urinary incontinence: Secondary | ICD-10-CM | POA: Diagnosis not present

## 2015-06-16 DIAGNOSIS — E119 Type 2 diabetes mellitus without complications: Secondary | ICD-10-CM

## 2015-06-16 LAB — COMPREHENSIVE METABOLIC PANEL
ALBUMIN: 4.4 g/dL (ref 3.5–5.2)
ALK PHOS: 56 U/L (ref 39–117)
ALT: 13 U/L (ref 0–35)
AST: 16 U/L (ref 0–37)
BILIRUBIN TOTAL: 0.5 mg/dL (ref 0.2–1.2)
BUN: 16 mg/dL (ref 6–23)
CALCIUM: 10 mg/dL (ref 8.4–10.5)
CO2: 29 mEq/L (ref 19–32)
CREATININE: 0.8 mg/dL (ref 0.40–1.20)
Chloride: 104 mEq/L (ref 96–112)
GFR: 90.44 mL/min (ref >60.00)
Glucose, Bld: 106 mg/dL — ABNORMAL HIGH (ref 70–99)
Potassium: 4.1 mEq/L (ref 3.5–5.1)
SODIUM: 142 meq/L (ref 135–145)
TOTAL PROTEIN: 7.9 g/dL (ref 6.0–8.3)

## 2015-06-16 LAB — LIPID PANEL
CHOLESTEROL: 133 mg/dL (ref 0–200)
HDL: 54.1 mg/dL (ref >39.00)
LDL CALC: 69 mg/dL (ref 0–99)
NonHDL: 79.37
Total CHOL/HDL Ratio: 2
Triglycerides: 53 mg/dL (ref 0.0–149.0)
VLDL: 10.6 mg/dL (ref 0.0–40.0)

## 2015-06-16 LAB — HEMOGLOBIN A1C: Hgb A1c MFr Bld: 6.7 % — ABNORMAL HIGH (ref 4.6–6.5)

## 2015-06-16 NOTE — Patient Instructions (Addendum)
We will check the blood work today and call you back with the results.  The throat will keep getting better on its own.   We will see you back in about 6 months.  Kegel Exercises The goal of Kegel exercises is to isolate and exercise your pelvic floor muscles. These muscles act as a hammock that supports the rectum, vagina, small intestine, and uterus. As the muscles weaken, the hammock sags and these organs are displaced from their normal positions. Kegel exercises can strengthen your pelvic floor muscles and help you to improve bladder and bowel control, improve sexual response, and help reduce many problems and some discomfort during pregnancy. Kegel exercises can be done anywhere and at any time. HOW TO PERFORM KEGEL EXERCISES 1. Locate your pelvic floor muscles. To do this, squeeze (contract) the muscles that you use when you try to stop the flow of urine. You will feel a tightness in the vaginal area (women) and a tight lift in the rectal area (men and women). 2. When you begin, contract your pelvic muscles tight for 2-5 seconds, then relax them for 2-5 seconds. This is one set. Do 4-5 sets with a short pause in between. 3. Contract your pelvic muscles for 8-10 seconds, then relax them for 8-10 seconds. Do 4-5 sets. If you cannot contract your pelvic muscles for 8-10 seconds, try 5-7 seconds and work your way up to 8-10 seconds. Your goal is 4-5 sets of 10 contractions each day. Keep your stomach, buttocks, and legs relaxed during the exercises. Perform sets of both short and long contractions. Vary your positions. Perform these contractions 3-4 times per day. Perform sets while you are:   Lying in bed in the morning.  Standing at lunch.  Sitting in the late afternoon.  Lying in bed at night. You should do 40-50 contractions per day. Do not perform more Kegel exercises per day than recommended. Overexercising can cause muscle fatigue. Continue these exercises for for at least 15-20 weeks  or as directed by your caregiver.   This information is not intended to replace advice given to you by your health care provider. Make sure you discuss any questions you have with your health care provider.   Document Released: 04/16/2012 Document Revised: 05/21/2014 Document Reviewed: 04/16/2012 Elsevier Interactive Patient Education [employer].

## 2015-06-16 NOTE — Progress Notes (Signed)
Pre visit review using our clinic review tool, if applicable. No additional management support is needed unless otherwise documented below in the visit note. 

## 2015-06-16 NOTE — Progress Notes (Signed)
   Subjective:    Patient ID: Monica [name], female    DOB: 1941-04-07, 75 y.o.   MRN: [id number]  HPI The patient is a 75 YO female coming in for follow up on her sore throat from last week. It is improving with conservative measures. She also has new problem of urinary incontinence. She does have to go often and is not always able to make it to the bathroom in time. She gets leaking of urine and has to wear a pad. Has never sought care for it before but has struggled with it for some time. She has also stopped taking the metformin and feels better overall. Wants to get her sugar checked.   Review of Systems  Constitutional: Negative.   Respiratory: Negative.   Cardiovascular: Negative.   Gastrointestinal: Negative.   Genitourinary:       Incontinence with leaking.   Skin: Negative.   Neurological: Negative for dizziness, weakness, light-headedness and headaches.  Psychiatric/Behavioral: Negative for dysphoric mood.      Objective:   Physical Exam  Constitutional: She is oriented to person, place, and time. She appears well-developed and well-nourished.  HENT:  Head: Normocephalic and atraumatic.  Right Ear: External ear normal.  Left Ear: External ear normal.  Eyes: EOM are normal.  Neck: Normal range of motion.  Cardiovascular: Normal rate and regular rhythm.   Pulmonary/Chest: Effort normal and breath sounds normal.  Abdominal: Soft. She exhibits no distension. There is no tenderness.  Neurological: She is alert and oriented to person, place, and time.  Skin: Skin is warm and dry.   Filed Vitals:   06/16/15 0953  BP: 150/78  Pulse: 69  Temp: 98.4 F (36.9 C)  TempSrc: Oral  Resp: 16  Height: 5\' 6"  (1.676 m)  Weight: 139 lb 6.4 oz (63.231 kg)  SpO2: 98%      Assessment & Plan:

## 2015-06-19 DIAGNOSIS — R32 Unspecified urinary incontinence: Secondary | ICD-10-CM | POA: Insufficient documentation

## 2015-06-19 NOTE — Assessment & Plan Note (Signed)
She is improving and will continue with conservative measures. Reassured and reminded about the typical course of symptoms.

## 2015-06-19 NOTE — Assessment & Plan Note (Signed)
She wishes to try non-medicine therapy first. Instructed on proper usage of kegel exercises. She will call back if not working.

## 2015-06-27 ENCOUNTER — Other Ambulatory Visit: Payer: Self-pay | Admitting: Internal Medicine

## 2015-08-03 ENCOUNTER — Ambulatory Visit (INDEPENDENT_AMBULATORY_CARE_PROVIDER_SITE_OTHER): Payer: Commercial Managed Care - HMO | Admitting: Nurse Practitioner

## 2015-08-03 VITALS — BP 138/82 | HR 71 | Temp 97.9°F | Ht 66.0 in | Wt 143.5 lb

## 2015-08-03 DIAGNOSIS — M545 Low back pain, unspecified: Secondary | ICD-10-CM

## 2015-08-03 DIAGNOSIS — G8929 Other chronic pain: Secondary | ICD-10-CM

## 2015-08-03 MED ORDER — MELOXICAM 7.5 MG PO TABS: 7.5000 mg | ORAL_TABLET | Freq: Every day | ORAL | Status: DC

## 2015-08-03 NOTE — Progress Notes (Signed)
Pre visit review using our clinic review tool, if applicable. No additional management support is needed unless otherwise documented below in the visit note. 

## 2015-08-03 NOTE — Progress Notes (Signed)
Patient ID: Monica [name], female    DOB: 05-Dec-1940  Age: 75 y.o. MRN: [id number]  CC: Back Pain   HPI Monica [name] presents for CC of back pain x 3 weeks.   1) Back pain x 3 weeks. This has been discussed in past records found in the chart- most recent 04/11/15.   Lumbar x-ray from 07/29/13 shows mild lumbar spondylosis  This is not a new problem. Pt has been seen by sports medicine, Dr. Jenny Reichmann, and Dr. Quay Burow in the past as well as her PCP. Tizanidine in the past   Onset- has had back surgery "years ago"  Location- lower back on right  Duration- 1-2 hours Characteristics- "hurts", cannot find a word to describe it  Aggravating factors- going from lying to sitting in the morning  Relieving factors- showering with warm water  Severity- moderate-severe   Treatment to date:  Patches (icy hot) and tylenol- somewhat helpful   History Monica [name] has a past medical history of Hypertension; GERD (gastroesophageal reflux disease); Colon polyp (2011); Stricture and stenosis of esophagus (2006); Arthritis; OSTEOPENIA; Mixed hyperlipidemia; LOW BACK PAIN, CHRONIC; GLAUCOMA; DEPRESSION, SITUATIONAL; Chronic constipation; ANXIETY; and Diabetes mellitus ([place]) (dx 02/2013).   She has past surgical history that includes Tubal ligation; Back surgery; Hemorroidectomy; Rotator cuff repair; Hysteroscopy w/D&C (07/20/2011); polypectomy (07/20/2011); and Esophageal manometry (N/A, 02/09/2013).   Her family history includes Breast cancer in her sister; Colon cancer (age of onset: 64) in her sister; Diabetes in her brother and mother; Heart failure in her father.She reports that she has never smoked. She has never used smokeless tobacco. She reports that she does not drink alcohol or use illicit drugs.  Outpatient Prescriptions Prior to Visit  Medication Sig Dispense Refill  . amLODipine (NORVASC) 5 MG tablet TAKE ONE TABLET BY MOUTH ONCE DAILY 90 tablet 0  . aspirin 81 MG tablet Take 81 mg by mouth daily.      .  Cholecalciferol (VITAMIN D3) 1000 UNITS CAPS Take 1,000 Units by mouth daily.    [name] dexlansoprazole (DEXILANT) 60 MG capsule Take 1 capsule (60 mg total) by mouth daily. Must establish with NEW PCP for additional refills. 90 capsule 1  . atorvastatin (LIPITOR) 10 MG tablet TAKE ONE TABLET BY MOUTH ONCE DAILY 90 tablet 3  . Blood Glucose Monitoring Suppl (ONE TOUCH ULTRA 2) W/DEVICE KIT Use to check blood sugar every morning (Patient not taking: Reported on 06/16/2015) 1 each 0  . glucose blood (ONE TOUCH ULTRA TEST) test strip 1 each by Other route daily. (Patient not taking: Reported on 06/16/2015) 100 each 5  . ONETOUCH DELICA LANCETS 96V MISC Use one lancet to check blood sugar in the morning before breakfast. (Patient not taking: Reported on 06/16/2015) 100 each 3  . polyethylene glycol powder (GLYCOLAX/MIRALAX) powder Take 17 g by mouth daily. (Patient not taking: Reported on 08/03/2015) 3350 g 1   No facility-administered medications prior to visit.    ROS Review of Systems  Constitutional: Negative for fever, chills, diaphoresis and fatigue.  Respiratory: Negative for chest tightness, shortness of breath and wheezing.   Cardiovascular: Negative for chest pain, palpitations and leg swelling.  Gastrointestinal: Negative for nausea, vomiting and diarrhea.  Musculoskeletal: Positive for myalgias and back pain.  Skin: Negative for rash.  Neurological: Negative for dizziness and headaches.    Objective:  BP 138/82 mmHg  Pulse 71  Temp(Src) 97.9 F (36.6 C) (Oral)  Ht '5\' 6"'$  (1.676 m)  Wt 143 lb 8 oz (65.091  kg)  BMI 23.17 kg/m2  SpO2 97%  Physical Exam  Constitutional: She is oriented to person, place, and time. She appears well-developed and well-nourished. No distress.  HENT:  Head: Normocephalic and atraumatic.  Right Ear: External ear normal.  Left Ear: External ear normal.  Cardiovascular: Normal rate, regular rhythm and normal heart sounds.  Exam reveals no gallop and no friction  rub.   No murmur heard. Pulmonary/Chest: Effort normal and breath sounds normal. No respiratory distress. She has no wheezes. She has no rales. She exhibits no tenderness.  Musculoskeletal: Normal range of motion. She exhibits tenderness. She exhibits no edema.  Right lumbar paraspinal tenderness  Neurological: She is alert and oriented to person, place, and time. No cranial nerve deficit. She exhibits normal muscle tone. Coordination normal.  Iliopsoas 5/5 Bilateral, Tib anterior 5/5 bilateral, EHL 5/5 bilateral, no ankle clonus, intact sequential walking, sensation intact upper and lower extremities. Straight leg raise negative bilaterally.   Skin: Skin is warm and dry. No rash noted. She is not diaphoretic.  Psychiatric: She has a normal mood and affect. Her behavior is normal. Judgment and thought content normal.   Assessment & Plan:   Monica [name] was seen today for back pain.  Diagnoses and all orders for this visit:  Chronic right-sided low back pain without sciatica  Other orders -     meloxicam (MOBIC) 7.5 MG tablet; Take 1 tablet (7.5 mg total) by mouth daily.   I have discontinued Ms. Claggett's ONE TOUCH ULTRA 2, polyethylene glycol powder, glucose blood, and ONETOUCH DELICA LANCETS 59F. I am also having her start on meloxicam. Additionally, I am having her maintain her aspirin, Vitamin D3, dexlansoprazole, and amLODipine.  Meds ordered this encounter  Medications  . meloxicam (MOBIC) 7.5 MG tablet    Sig: Take 1 tablet (7.5 mg total) by mouth daily.    Dispense:  10 tablet    Refill:  0    Order Specific Question:  Supervising Provider    Answer:  Crecencio Mc [[id number]]     Follow-up: Return if symptoms worsen or fail to improve.

## 2015-08-03 NOTE — Patient Instructions (Signed)

## 2015-08-04 ENCOUNTER — Other Ambulatory Visit: Payer: Self-pay | Admitting: Internal Medicine

## 2015-08-07 ENCOUNTER — Encounter: Payer: Self-pay | Admitting: Nurse Practitioner

## 2015-08-07 DIAGNOSIS — M545 Low back pain, unspecified: Secondary | ICD-10-CM | POA: Insufficient documentation

## 2015-08-07 NOTE — Assessment & Plan Note (Signed)
Established problem; worsening again Mobic sent to pharmacy used 7.5 mg due to age- take with food and no other NSAIDs use shortest period of time.  Stretches demonstrated FU with PCP or Sports Medicine if no improvement or worsening

## 2015-08-09 ENCOUNTER — Telehealth: Payer: Self-pay

## 2015-08-09 ENCOUNTER — Other Ambulatory Visit: Payer: Self-pay

## 2015-08-09 MED ORDER — AMLODIPINE BESYLATE 5 MG PO TABS: 5.0000 mg | ORAL_TABLET | Freq: Every day | ORAL | Status: DC

## 2015-08-09 MED ORDER — ATORVASTATIN CALCIUM 10 MG PO TABS: 10.0000 mg | ORAL_TABLET | Freq: Every day | ORAL | Status: DC

## 2015-08-09 NOTE — Telephone Encounter (Signed)
Last OV i am seeing that hypertension was addressed was with dr Asa Lente in jan/2015----are you ok with refilling amlodipine 5mg  tab and atorvastatin 10mg  tab (recd faxed rx request from [facility])----please advise, thanks

## 2015-08-09 NOTE — Telephone Encounter (Signed)
Pt called stated she need this to be send to [facility]. She does not want to use [employer]. Please check

## 2015-08-09 NOTE — Telephone Encounter (Signed)
Yes, she was just seen in Feb

## 2015-08-09 NOTE — Telephone Encounter (Signed)
Sent to [facility]-----faxed copy to [employer] to cancel

## 2015-09-10 ENCOUNTER — Encounter: Payer: Self-pay | Admitting: Family Medicine

## 2015-09-10 ENCOUNTER — Ambulatory Visit (INDEPENDENT_AMBULATORY_CARE_PROVIDER_SITE_OTHER): Payer: Commercial Managed Care - HMO | Admitting: Family Medicine

## 2015-09-10 VITALS — BP 122/80 | HR 79 | Temp 97.9°F | Ht 66.0 in | Wt 139.0 lb

## 2015-09-10 DIAGNOSIS — G44219 Episodic tension-type headache, not intractable: Secondary | ICD-10-CM | POA: Diagnosis not present

## 2015-09-10 DIAGNOSIS — F411 Generalized anxiety disorder: Secondary | ICD-10-CM | POA: Diagnosis not present

## 2015-09-10 NOTE — Progress Notes (Signed)
Pre visit review using our clinic review tool, if applicable. No additional management support is needed unless otherwise documented below in the visit note. 

## 2015-09-10 NOTE — Progress Notes (Signed)
OFFICE VISIT  09/10/2015   CC:  Chief Complaint  Patient presents with  . Headache    x 4 days mild dizziness     HPI:    Patient is a 75 y.o. African-American female who presents to Saturday clinic for headache. Describes frontal headache onset about 4d/a, moderate intensity, gets some intermittent lightheaded feeling with this.  No vertigo.  She describes lots of family stresses lately. No signif vision c/o with these HA's.  Glucose yest morning was 106.   Has not taken anything for her HA.   Past Medical History  Diagnosis Date  . Hypertension   . GERD (gastroesophageal reflux disease)     w/ HH (EGD 2010)  . 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 Stricture and stenosis of esophagus 2006  . Arthritis   . OSTEOPENIA   . Mixed hyperlipidemia   . LOW BACK PAIN, CHRONIC   . GLAUCOMA   . DEPRESSION, SITUATIONAL   . Chronic constipation   . ANXIETY   . Diabetes mellitus ([place]) dx 02/2013    Past Surgical History  Procedure Laterality Date  . Tubal ligation    . Back surgery    . Hemorroidectomy    . Rotator cuff repair      right   . Hysteroscopy w/d&c  07/20/2011    Procedure: DILATATION AND CURETTAGE /HYSTEROSCOPY;  Surgeon: Melina Schools, MD;  Location: [facility];  Service: Gynecology;  Laterality: N/A;  . Polypectomy  07/20/2011    Procedure: POLYPECTOMY;  Surgeon: Melina Schools, MD;  Location: [facility];  Service: Gynecology;  Laterality: N/A;  . Esophageal manometry N/A 02/09/2013    Procedure: ESOPHAGEAL MANOMETRY (EM);  Surgeon: Sable Feil, MD;  Location: [facility];  Service: Endoscopy;  Laterality: N/A;    Outpatient Prescriptions Prior to Visit  Medication Sig Dispense Refill  . amLODipine (NORVASC) 5 MG tablet Take 1 tablet (5 mg total) by mouth daily. 90 tablet 2  . aspirin 81 MG tablet Take 81 mg by mouth daily.      [name] atorvastatin (LIPITOR) 10 MG tablet Take 1 tablet (10 mg total) by mouth daily. 90 tablet 2  . Cholecalciferol (VITAMIN D3)  1000 UNITS CAPS Take 1,000 Units by mouth daily.    [name] dexlansoprazole (DEXILANT) 60 MG capsule Take 1 capsule (60 mg total) by mouth daily. Must establish with NEW PCP for additional refills. 90 capsule 1  . meloxicam (MOBIC) 7.5 MG tablet Take 1 tablet (7.5 mg total) by mouth daily. 10 tablet 0   No facility-administered medications prior to visit.    Allergies  Allergen Reactions  . Codeine     REACTION: Upset stomach  . Hydrochlorothiazide     REACTION: Nausea and headache    ROS As per HPI  PE: Blood pressure 122/80, pulse 79, temperature 97.9 F (36.6 C), temperature source Oral, height 5\' 6"  (1.676 m), weight 139 lb (63.05 kg), SpO2 98 %. Gen: Alert, well appearing.  Patient is oriented to person, place, time, and situation. ENT: Ears: EACs clear, normal epithelium.  TMs with good light reflex and landmarks bilaterally.  Eyes: no injection, icteris, swelling, or exudate.  EOMI, PERRLA. Nose: no drainage or turbinate edema/swelling.  No injection or focal lesion.  Mouth: lips without lesion/swelling.  Oral mucosa pink and moist.  Dentition intact and without obvious caries or gingival swelling.  Oropharynx without erythema, exudate, or swelling.  Neck - No masses or thyromegaly or limitation in range  of motion CV: RRR, no m/r/g.   LUNGS: CTA bilat, nonlabored resps, good aeration in all lung fields. Neuro: CN 2-12 intact bilaterally, strength 5/5 in proximal and distal upper extremities and lower extremities bilaterally.  No sensory deficits.  No tremor.  No disdiadochokinesis.  No ataxia.  Upper extremity and lower extremity DTRs symmetric.  No pronator drift.   LABS:    Chemistry      Component Value Date/Time   NA 142 06/16/2015 1024   K 4.1 06/16/2015 1024   CL 104 06/16/2015 1024   CO2 29 06/16/2015 1024   BUN 16 06/16/2015 1024   CREATININE 0.80 06/16/2015 1024      Component Value Date/Time   CALCIUM 10.0 06/16/2015 1024   ALKPHOS 56 06/16/2015 1024   AST 16  06/16/2015 1024   ALT 13 06/16/2015 1024   BILITOT 0.5 06/16/2015 1024       IMPRESSION AND PLAN:  Tension type headache, made worse + some disequilibrium sensation intermittently that is likely anxiety-related.  She talked for about 5 minutes about her family problems and seemed to feel better. Instructions: Take 1-2 of your extra strength tylenol every 6 hours as needed for headache. Drink plenty of clear fluids.  Rest.  An After Visit Summary was printed and given to the patient.  FOLLOW UP: Return if symptoms worsen or fail to improve.

## 2015-09-10 NOTE — Patient Instructions (Signed)
Take 1-2 of your extra strength tylenol every 6 hours as needed for headache. Drink plenty of clear fluids.  Rest.

## 2015-09-27 DIAGNOSIS — H40009 Preglaucoma, unspecified, unspecified eye: Secondary | ICD-10-CM | POA: Diagnosis not present

## 2015-09-27 DIAGNOSIS — E109 Type 1 diabetes mellitus without complications: Secondary | ICD-10-CM | POA: Diagnosis not present

## 2015-09-27 DIAGNOSIS — H5200 Hypermetropia, unspecified eye: Secondary | ICD-10-CM | POA: Diagnosis not present

## 2015-09-27 DIAGNOSIS — I10 Essential (primary) hypertension: Secondary | ICD-10-CM | POA: Diagnosis not present

## 2015-09-27 DIAGNOSIS — H5210 Myopia, unspecified eye: Secondary | ICD-10-CM | POA: Diagnosis not present

## 2015-10-05 DIAGNOSIS — Z0100 Encounter for examination of eyes and vision without abnormal findings: Secondary | ICD-10-CM | POA: Diagnosis not present

## 2015-10-13 ENCOUNTER — Other Ambulatory Visit: Payer: Self-pay | Admitting: Internal Medicine

## 2015-10-28 ENCOUNTER — Encounter (HOSPITAL_COMMUNITY): Payer: Self-pay | Admitting: Oncology

## 2015-10-28 ENCOUNTER — Encounter (HOSPITAL_BASED_OUTPATIENT_CLINIC_OR_DEPARTMENT_OTHER): Payer: Commercial Managed Care - HMO

## 2015-10-28 ENCOUNTER — Encounter (HOSPITAL_COMMUNITY): Payer: Commercial Managed Care - HMO | Attending: Oncology | Admitting: Oncology

## 2015-10-28 ENCOUNTER — Ambulatory Visit (HOSPITAL_COMMUNITY): Payer: PRIVATE HEALTH INSURANCE | Admitting: Oncology

## 2015-10-28 ENCOUNTER — Other Ambulatory Visit (HOSPITAL_COMMUNITY): Payer: PRIVATE HEALTH INSURANCE

## 2015-10-28 VITALS — BP 121/74 | HR 74 | Temp 97.9°F | Resp 20 | Wt 142.4 lb

## 2015-10-28 DIAGNOSIS — Z9889 Other specified postprocedural states: Secondary | ICD-10-CM | POA: Insufficient documentation

## 2015-10-28 DIAGNOSIS — D509 Iron deficiency anemia, unspecified: Secondary | ICD-10-CM | POA: Diagnosis not present

## 2015-10-28 DIAGNOSIS — Z803 Family history of malignant neoplasm of breast: Secondary | ICD-10-CM | POA: Insufficient documentation

## 2015-10-28 DIAGNOSIS — Z808 Family history of malignant neoplasm of other organs or systems: Secondary | ICD-10-CM | POA: Diagnosis not present

## 2015-10-28 DIAGNOSIS — I10 Essential (primary) hypertension: Secondary | ICD-10-CM | POA: Diagnosis not present

## 2015-10-28 DIAGNOSIS — D500 Iron deficiency anemia secondary to blood loss (chronic): Secondary | ICD-10-CM | POA: Diagnosis not present

## 2015-10-28 DIAGNOSIS — K219 Gastro-esophageal reflux disease without esophagitis: Secondary | ICD-10-CM | POA: Diagnosis not present

## 2015-10-28 DIAGNOSIS — Z0000 Encounter for general adult medical examination without abnormal findings: Secondary | ICD-10-CM

## 2015-10-28 DIAGNOSIS — Z9851 Tubal ligation status: Secondary | ICD-10-CM | POA: Diagnosis not present

## 2015-10-28 DIAGNOSIS — Z833 Family history of diabetes mellitus: Secondary | ICD-10-CM | POA: Insufficient documentation

## 2015-10-28 LAB — CBC WITH DIFFERENTIAL/PLATELET
BASOS PCT: 0 %
Basophils Absolute: 0 10*3/uL (ref 0.0–0.1)
EOS ABS: 0 10*3/uL (ref 0.0–0.7)
EOS PCT: 1 %
HCT: 38.8 % (ref 36.0–46.0)
HEMOGLOBIN: 12.8 g/dL (ref 12.0–15.0)
LYMPHS ABS: 1.6 10*3/uL (ref 0.7–4.0)
Lymphocytes Relative: 41 %
MCH: 30.5 pg (ref 26.0–34.0)
MCHC: 33 g/dL (ref 30.0–36.0)
MCV: 92.4 fL (ref 78.0–100.0)
Monocytes Absolute: 0.3 10*3/uL (ref 0.1–1.0)
Monocytes Relative: 8 %
NEUTROS PCT: 50 %
Neutro Abs: 2 10*3/uL (ref 1.7–7.7)
PLATELETS: 211 10*3/uL (ref 150–400)
RBC: 4.2 MIL/uL (ref 3.87–5.11)
RDW: 14.4 % (ref 11.5–15.5)
WBC: 4 10*3/uL (ref 4.0–10.5)

## 2015-10-28 LAB — IRON AND TIBC
IRON: 71 ug/dL (ref 28–170)
SATURATION RATIOS: 25 % (ref 10.4–31.8)
TIBC: 280 ug/dL (ref 250–450)
UIBC: 209 ug/dL

## 2015-10-28 LAB — FERRITIN: FERRITIN: 138 ng/mL (ref 11–307)

## 2015-10-28 NOTE — Patient Instructions (Signed)
[facility] Discharge Instructions  RECOMMENDATIONS MADE BY THE CONSULTANT AND ANY TEST RESULTS WILL BE SENT TO YOUR REFERRING PHYSICIAN.  Exam done and seen today by [name] in 6 months Return to see the Doctor in 6 months Call the clinic for any concerns or questions.  Thank you for choosing [facility] to provide your oncology and hematology care.  To afford each patient quality time with our provider, please arrive at least 15 minutes before your scheduled appointment time.   Beginning January 23rd 2017 lab work for the [facility] will be done in the  Main lab at [facility] on 1st floor. If you have a lab appointment with the [facility] please come in thru the  Main Entrance and check in at the main information desk  You need to re-schedule your appointment should you arrive 10 or more minutes late.  We strive to give you quality time with our providers, and arriving late affects you and other patients whose appointments are after yours.  Also, if you no show three or more times for appointments you may be dismissed from the clinic at the providers discretion.     Again, thank you for choosing [facility].  Our hope is that these requests will decrease the amount of time that you wait before being seen by our physicians.       _____________________________________________________________  Should you have questions after your visit to [facility], please contact our office at [phone] between the hours of 8:30 a.m. and 4:30 p.m.  Voicemails left after 4:30 p.m. will not be returned until the following business day.  For prescription refill requests, have your pharmacy contact our office.         Resources For Cancer Patients and their Caregivers ? American Cancer Society: Can assist with transportation, wigs, general needs, runs Look Good Feel Better.         651 861 4720 ? Cancer Care: Provides financial assistance, online support groups, medication/co-pay assistance.  1-800-813-HOPE 574-283-8500) ? [facility] Assists [place] cancer patients and their families through emotional , educational and financial support.  (325)232-9756 ? [place] DSS Where to apply for food stamps, Medicaid and utility assistance. 9252487349 ? [facility]: Transportation to medical appointments. 8733198246 ? Social Security Administration: May apply for disability if have a Stage IV cancer. 336-646-0971 (807)483-7396 ? [employer], Disability and Transit Services: Assists with nutrition, care and transit needs. [place] Support Programs: @10RELATIVEDAYS @ > Cancer Support Group  2nd Tuesday of the month 1pm-2pm, Journey Room  > Creative Journey  3rd Tuesday of the month 1130am-1pm, Journey Room  > Look Good Feel Better  1st Wednesday of the month 10am-12 noon, Journey Room (Call [facility] to register [phone])

## 2015-10-28 NOTE — Progress Notes (Signed)
Monica Koch, MD [place] [address]  Iron deficiency anemia due to chronic blood loss - Plan: CBC with Differential, Iron and TIBC, Ferritin, Basic metabolic panel  Preventative health care - Plan: CANCELED: MM SCREENING BREAST TOMO BILATERAL  CURRENT THERAPY: IV iron replacement as indicated.  INTERVAL HISTORY: Monica [name] 75 y.o. female returns for followup of iron deficiency anemia secondary to chronic GI blood loss in the setting of gastroesophageal reflux disease with stricture, normal esophageal manometry, on long-term proton pump inhibitor therapy, status post dilatation.  Chart reviewed.  Status post colonoscopy on 09/21/2013 by Dr. [name] showing internal hemorrhoids and small sessile polyp measuring 3 mm in the distal transverse colon resulting in polypectomy; otherwise colon is normal.  This is my first time meeting the patient today and she is very pleasant.  She denies any complaints.  She is doing very well.  She denies any blood in her stool or dark stools.  She denies any epistaxis, hemoptysis, hematuria.   She notes that she started a probiotic and her bowel movements are now daily.  She has not needed IV iron replacement in 2 years, last given in August 2015.  Review of Systems  Constitutional: Negative.   HENT: Negative.   Eyes: Negative.   Respiratory: Negative.   Cardiovascular: Negative.   Gastrointestinal: Negative.  Negative for blood in stool and melena.  Genitourinary: Negative.   Musculoskeletal: Negative.   Skin: Negative.   Neurological: Negative.   Endo/Heme/Allergies: Negative.   Psychiatric/Behavioral: Negative.     Past Medical History  Diagnosis Date  . Hypertension   . GERD (gastroesophageal reflux disease)     w/ HH (EGD 2010)  . 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 Stricture and stenosis of esophagus 2006  . Arthritis   . OSTEOPENIA   . Mixed hyperlipidemia   . LOW BACK PAIN,  CHRONIC   . GLAUCOMA   . DEPRESSION, SITUATIONAL   . Chronic constipation   . ANXIETY   . Diabetes mellitus ([place]) dx 02/2013  . Iron deficiency anemia due to chronic blood loss 02/05/2014    Past Surgical History  Procedure Laterality Date  . Tubal ligation    . Back surgery    . Hemorroidectomy    . Rotator cuff repair      right   . Hysteroscopy w/d&c  07/20/2011    Procedure: DILATATION AND CURETTAGE /HYSTEROSCOPY;  Surgeon: Melina Schools, MD;  Location: [facility];  Service: Gynecology;  Laterality: N/A;  . Polypectomy  07/20/2011    Procedure: POLYPECTOMY;  Surgeon: Melina Schools, MD;  Location: [facility];  Service: Gynecology;  Laterality: N/A;  . Esophageal manometry N/A 02/09/2013    Procedure: ESOPHAGEAL MANOMETRY (EM);  Surgeon: Sable Feil, MD;  Location: [facility];  Service: Endoscopy;  Laterality: N/A;    Family History  Problem Relation Age of Onset  . Heart failure Father   . Diabetes Mother   . Breast cancer Sister   . Colon cancer Sister 45  . Diabetes Brother     Social History   Social History  . Marital Status: Divorced    Spouse Name: N/A  . Number of Children: 5  . Years of Education: N/A   Occupational History  . retired    Social History Main Topics  . Smoking status: Never Smoker   . Smokeless tobacco: Never Used  . Alcohol Use: No  .  Drug Use: No  . Sexual Activity: No   Other Topics Concern  . Not on file   Social History Narrative     PHYSICAL EXAMINATION  ECOG PERFORMANCE STATUS: 0 - Asymptomatic  Filed Vitals:   10/28/15 1013  BP: 121/74  Pulse: 74  Temp: 97.9 F (36.6 C)  Resp: 20    GENERAL:alert, healthy, no distress, well nourished, well developed, comfortable, cooperative, smiling and unaccompanied, very pleasant SKIN: skin color, texture, turgor are normal, no rashes or significant lesions HEAD: Normocephalic, No masses, lesions, tenderness or abnormalities EYES: normal, EOMI EARS: External ears  normal OROPHARYNX:lips, buccal mucosa, and tongue normal and mucous membranes are moist  NECK: supple, trachea midline LYMPH:  not examined BREAST:not examined LUNGS: clear to auscultation and percussion HEART: regular rate & rhythm, no murmurs, no gallops, S1 normal and S2 normal ABDOMEN:abdomen soft, non-tender, normal bowel sounds and no masses or organomegaly BACK: Back symmetric, no curvature., No CVA tenderness EXTREMITIES:less then 2 second capillary refill, no joint deformities, effusion, or inflammation, no edema, no skin discoloration, no clubbing, no cyanosis  NEURO: alert & oriented x 3 with fluent speech, no focal motor/sensory deficits, gait normal   LABORATORY DATA: CBC    Component Value Date/Time   WBC 4.0 10/28/2015 0926   RBC 4.20 10/28/2015 0926   RBC 4.62 12/17/2013 1444   HGB 12.8 10/28/2015 0926   HCT 38.8 10/28/2015 0926   PLT 211 10/28/2015 0926   MCV 92.4 10/28/2015 0926   MCH 30.5 10/28/2015 0926   MCHC 33.0 10/28/2015 0926   RDW 14.4 10/28/2015 0926   LYMPHSABS 1.6 10/28/2015 0926   MONOABS 0.3 10/28/2015 0926   EOSABS 0.0 10/28/2015 0926   BASOSABS 0.0 10/28/2015 0926      Chemistry      Component Value Date/Time   NA 142 06/16/2015 1024   K 4.1 06/16/2015 1024   CL 104 06/16/2015 1024   CO2 29 06/16/2015 1024   BUN 16 06/16/2015 1024   CREATININE 0.80 06/16/2015 1024      Component Value Date/Time   CALCIUM 10.0 06/16/2015 1024   ALKPHOS 56 06/16/2015 1024   AST 16 06/16/2015 1024   ALT 13 06/16/2015 1024   BILITOT 0.5 06/16/2015 1024      Lab Results  Component Value Date   IRON 65 04/28/2015   TIBC 286 04/28/2015   FERRITIN 128 04/28/2015     PENDING LABS:   RADIOGRAPHIC STUDIES:  No results found.   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  Oncology Flowsheet 12/24/2013 12/30/2013  ferumoxytol (FERAHEME) IV 510 mg 510 mg   Labs today: CBC diff, iron/TIBC, ferritin.  I personally reviewed and went over laboratory results with the patient.  The results are noted within this dictation.  Based upon iron studies, further hematology recommendations will follow.  Labs in 6 months: CBC diff, iron/TIBC, ferritin, BMET.  She is overdue for mammography according to our records, BUT, she reports that she had one done by Dr. Newton Pigg at the end of December 2016 as part of her annual physical exam.  We will try to ascertain a copy of this report.  Return in 6 months for follow-up.      ORDERS PLACED  FOR THIS ENCOUNTER: Orders Placed This Encounter  Procedures  . CBC with Differential  . Iron and TIBC  . Ferritin  . Basic metabolic panel    MEDICATIONS PRESCRIBED THIS ENCOUNTER: Meds ordered this encounter  Medications  . Probiotic Product (PROBIOTIC DAILY PO)    Sig: Take 1 capsule by mouth daily.    THERAPY PLAN:  We will continue to monitor blood counts and provide IV iron when indicated.  All questions were answered. The patient knows to call the clinic with any problems, questions or concerns. We can certainly see the patient much sooner if necessary.  Patient and plan discussed with Dr. Ancil Linsey and she is in agreement with the aforementioned.   This note is electronically signed by: Doy Mince 10/28/2015 10:40 AM

## 2015-10-28 NOTE — Assessment & Plan Note (Addendum)
Iron deficiency anemia secondary to chronic GI blood loss requiring intermittent IV iron replacement in the setting of gastroesophageal reflux disease with stricture, normal esophageal manometry, on long-term proton pump inhibitor therapy, status post dilatation.  Oncology Flowsheet 12/24/2013 12/30/2013  ferumoxytol (FERAHEME) IV 510 mg 510 mg   Labs today: CBC diff, iron/TIBC, ferritin.  I personally reviewed and went over laboratory results with the patient.  The results are noted within this dictation.  Based upon iron studies, further hematology recommendations will follow.  Labs in 6 months: CBC diff, iron/TIBC, ferritin, BMET.  She is overdue for mammography according to our records, BUT, she reports that she had one done by Dr. Newton Pigg at the end of December 2016 as part of her annual physical exam.  We will try to ascertain a copy of this report.  Return in 6 months for follow-up.

## 2015-11-11 ENCOUNTER — Other Ambulatory Visit: Payer: Self-pay | Admitting: Nurse Practitioner

## 2015-12-14 ENCOUNTER — Ambulatory Visit (INDEPENDENT_AMBULATORY_CARE_PROVIDER_SITE_OTHER): Payer: Commercial Managed Care - HMO | Admitting: Internal Medicine

## 2015-12-14 ENCOUNTER — Encounter: Payer: Self-pay | Admitting: Internal Medicine

## 2015-12-14 ENCOUNTER — Other Ambulatory Visit (INDEPENDENT_AMBULATORY_CARE_PROVIDER_SITE_OTHER): Payer: Commercial Managed Care - HMO

## 2015-12-14 VITALS — BP 134/72 | HR 65 | Temp 98.2°F | Resp 14 | Ht 66.0 in | Wt 141.0 lb

## 2015-12-14 DIAGNOSIS — E119 Type 2 diabetes mellitus without complications: Secondary | ICD-10-CM

## 2015-12-14 DIAGNOSIS — G8929 Other chronic pain: Secondary | ICD-10-CM | POA: Diagnosis not present

## 2015-12-14 DIAGNOSIS — M545 Low back pain, unspecified: Secondary | ICD-10-CM

## 2015-12-14 LAB — HEMOGLOBIN A1C: Hgb A1c MFr Bld: 6.5 % (ref 4.6–6.5)

## 2015-12-14 MED ORDER — MELOXICAM 7.5 MG PO TABS: 7.5000 mg | ORAL_TABLET | Freq: Every day | ORAL | 2 refills | Status: DC

## 2015-12-14 NOTE — Patient Instructions (Signed)
We will check the labs today and call you back.   We have sent in the meloxicam that you can take daily for the back pain.    Back Exercises The following exercises strengthen the muscles that help to support the back. They also help to keep the lower back flexible. Doing these exercises can help to prevent back pain or lessen existing pain. If you have back pain or discomfort, try doing these exercises 2-3 times each day or as told by your health care provider. When the pain goes away, do them once each day, but increase the number of times that you repeat the steps for each exercise (do more repetitions). If you do not have back pain or discomfort, do these exercises once each day or as told by your health care provider. EXERCISES Single Knee to Chest Repeat these steps 3-5 times for each leg: 1. Lie on your back on a firm bed or the floor with your legs extended. 2. Bring one knee to your chest. Your other leg should stay extended and in contact with the floor. 3. Hold your knee in place by grabbing your knee or thigh. 4. Pull on your knee until you feel a gentle stretch in your lower back. 5. Hold the stretch for 10-30 seconds. 6. Slowly release and straighten your leg. Pelvic Tilt Repeat these steps 5-10 times: 1. Lie on your back on a firm bed or the floor with your legs extended. 2. Bend your knees so they are pointing toward the ceiling and your feet are flat on the floor. 3. Tighten your lower abdominal muscles to press your lower back against the floor. This motion will tilt your pelvis so your tailbone points up toward the ceiling instead of pointing to your feet or the floor. 4. With gentle tension and even breathing, hold this position for 5-10 seconds. Cat-Cow Repeat these steps until your lower back becomes more flexible: 1. Get into a hands-and-knees position on a firm surface. Keep your hands under your shoulders, and keep your knees under your hips. You may place padding  under your knees for comfort. 2. Let your head hang down, and point your tailbone toward the floor so your lower back becomes rounded like the back of a cat. 3. Hold this position for 5 seconds. 4. Slowly lift your head and point your tailbone up toward the ceiling so your back forms a sagging arch like the back of a cow. 5. Hold this position for 5 seconds. Press-Ups Repeat these steps 5-10 times: 1. Lie on your abdomen (face-down) on the floor. 2. Place your palms near your head, about shoulder-width apart. 3. While you keep your back as relaxed as possible and keep your hips on the floor, slowly straighten your arms to raise the top half of your body and lift your shoulders. Do not use your back muscles to raise your upper torso. You may adjust the placement of your hands to make yourself more comfortable. 4. Hold this position for 5 seconds while you keep your back relaxed. 5. Slowly return to lying flat on the floor. Bridges Repeat these steps 10 times: 1. Lie on your back on a firm surface. 2. Bend your knees so they are pointing toward the ceiling and your feet are flat on the floor. 3. Tighten your buttocks muscles and lift your buttocks off of the floor until your waist is at almost the same height as your knees. You should feel the muscles working in your  buttocks and the back of your thighs. If you do not feel these muscles, slide your feet 1-2 inches farther away from your buttocks. 4. Hold this position for 3-5 seconds. 5. Slowly lower your hips to the starting position, and allow your buttocks muscles to relax completely. If this exercise is too easy, try doing it with your arms crossed over your chest. Abdominal Crunches Repeat these steps 5-10 times: 1. Lie on your back on a firm bed or the floor with your legs extended. 2. Bend your knees so they are pointing toward the ceiling and your feet are flat on the floor. 3. Cross your arms over your chest. 4. Tip your chin slightly  toward your chest without bending your neck. 5. Tighten your abdominal muscles and slowly raise your trunk (torso) high enough to lift your shoulder blades a tiny bit off of the floor. Avoid raising your torso higher than that, because it can put too much stress on your low back and it does not help to strengthen your abdominal muscles. 6. Slowly return to your starting position. Back Lifts Repeat these steps 5-10 times: 1. Lie on your abdomen (face-down) with your arms at your sides, and rest your forehead on the floor. 2. Tighten the muscles in your legs and your buttocks. 3. Slowly lift your chest off of the floor while you keep your hips pressed to the floor. Keep the back of your head in line with the curve in your back. Your eyes should be looking at the floor. 4. Hold this position for 3-5 seconds. 5. Slowly return to your starting position. SEEK MEDICAL CARE IF:  Your back pain or discomfort gets much worse when you do an exercise.  Your back pain or discomfort does not lessen within 2 hours after you exercise. If you have any of these problems, stop doing these exercises right away. Do not do them again unless your health care provider says that you can. SEEK IMMEDIATE MEDICAL CARE IF:  You develop sudden, severe back pain. If this happens, stop doing the exercises right away. Do not do them again unless your health care provider says that you can.   This information is not intended to replace advice given to you by your health care provider. Make sure you discuss any questions you have with your health care provider.   Document Released: 06/07/2004 Document Revised: 01/19/2015 Document Reviewed: 06/24/2014 Elsevier Interactive Patient Education [employer].

## 2015-12-14 NOTE — Assessment & Plan Note (Signed)
Rx for meloxicam as it helped, likely some arthritis mixed with acute injury to the area. Encouraged her to start some stretching exercise and given her copy of those.

## 2015-12-14 NOTE — Progress Notes (Signed)
Pre visit review using our clinic review tool, if applicable. No additional management support is needed unless otherwise documented below in the visit note. 

## 2015-12-14 NOTE — Progress Notes (Signed)
   Subjective:    Patient ID: Monica [name], female    DOB: October 14, 1940, 75 y.o.   MRN: [id number]  HPI The patient is a 75 YO female coming in for follow up of her diabetes (stopped metformin with last visit, no low sugars or signs of high sugars that she has noticed), and having some new back pain since last visit (had a fall and did a couple weeks of low meloxicam, back is still sore in the morning and aching). She would like to do more meloxicam to help with the pain.   Review of Systems  Constitutional: Negative.   Respiratory: Negative.   Cardiovascular: Negative.   Gastrointestinal: Negative.   Musculoskeletal: Positive for arthralgias and back pain. Negative for myalgias, neck pain and neck stiffness.  Skin: Negative.   Neurological: Negative for dizziness, weakness, light-headedness and headaches.  Psychiatric/Behavioral: Negative for dysphoric mood.      Objective:   Physical Exam  Constitutional: She is oriented to person, place, and time. She appears well-developed and well-nourished.  HENT:  Head: Normocephalic and atraumatic.  Right Ear: External ear normal.  Left Ear: External ear normal.  Eyes: EOM are normal.  Neck: Normal range of motion.  Cardiovascular: Normal rate and regular rhythm.   Pulmonary/Chest: Effort normal and breath sounds normal.  Abdominal: Soft. She exhibits no distension. There is no tenderness.  Neurological: She is alert and oriented to person, place, and time.  Skin: Skin is warm and dry.   Vitals:   12/14/15 0800  BP: 134/72  Pulse: 65  Resp: 14  Temp: 98.2 F (36.8 C)  TempSrc: Oral  SpO2: 92%  Weight: 141 lb (64 kg)  Height: 5\' 6"  (1.676 m)      Assessment & Plan:

## 2015-12-14 NOTE — Assessment & Plan Note (Signed)
Checking HgA1c now that she is off all medications. Previous at goal of <8. Foot exam done today and reminded about eye exam.

## 2016-02-22 ENCOUNTER — Telehealth: Payer: Self-pay | Admitting: Emergency Medicine

## 2016-02-22 NOTE — Telephone Encounter (Signed)
Patient aware.

## 2016-02-22 NOTE — Telephone Encounter (Signed)
I assume she means that she is constipated. She can try miralax or senokot-d over the counter. Also she should increase her water intake and try to increase fruits and vegetables and fiber in her diet.

## 2016-02-22 NOTE — Telephone Encounter (Signed)
Pt called and has not been able to use the bathroom since Monday. She is wondering what you would recommend her taking to help with this? Please advise thanks.

## 2016-02-23 DIAGNOSIS — H401131 Primary open-angle glaucoma, bilateral, mild stage: Secondary | ICD-10-CM | POA: Diagnosis not present

## 2016-02-27 ENCOUNTER — Telehealth: Payer: Self-pay | Admitting: Internal Medicine

## 2016-02-27 NOTE — Telephone Encounter (Signed)
Referral was already done

## 2016-02-27 NOTE — Telephone Encounter (Signed)
Pt is needing a [facility] referral.  Can you call her when you get a chance?

## 2016-03-02 DIAGNOSIS — H401122 Primary open-angle glaucoma, left eye, moderate stage: Secondary | ICD-10-CM | POA: Diagnosis not present

## 2016-03-02 DIAGNOSIS — H401111 Primary open-angle glaucoma, right eye, mild stage: Secondary | ICD-10-CM | POA: Diagnosis not present

## 2016-03-14 DIAGNOSIS — H401111 Primary open-angle glaucoma, right eye, mild stage: Secondary | ICD-10-CM | POA: Diagnosis not present

## 2016-04-11 ENCOUNTER — Other Ambulatory Visit: Payer: Self-pay | Admitting: Internal Medicine

## 2016-04-19 ENCOUNTER — Encounter: Payer: Self-pay | Admitting: Internal Medicine

## 2016-04-19 ENCOUNTER — Ambulatory Visit (INDEPENDENT_AMBULATORY_CARE_PROVIDER_SITE_OTHER): Payer: Commercial Managed Care - HMO | Admitting: Internal Medicine

## 2016-04-19 VITALS — BP 102/62 | HR 64 | Temp 98.4°F | Resp 12 | Ht 66.0 in | Wt 140.0 lb

## 2016-04-19 DIAGNOSIS — N6320 Unspecified lump in the left breast, unspecified quadrant: Secondary | ICD-10-CM

## 2016-04-19 NOTE — Patient Instructions (Signed)
We have ordered the mammogram at the breast center so call to schedule.

## 2016-04-19 NOTE — Progress Notes (Signed)
Pre visit review using our clinic review tool, if applicable. No additional management support is needed unless otherwise documented below in the visit note. 

## 2016-04-19 NOTE — Progress Notes (Signed)
   Subjective:    Patient ID: Monica [name], female    DOB: 10-Oct-1940, 75 y.o.   MRN: [id number]  HPI The patient is a 75 YO female coming in for left breast lump which she noticed about 4 days ago. Non-tender and at the bottom of the left breast. She had mammogram Jan 2017 at her gynecology office and normal. Sister with breast cancer. No weight change recently. No nipple discharge.  Review of Systems  Constitutional: Negative.   Respiratory: Negative.   Cardiovascular: Negative.   Gastrointestinal: Negative.   Genitourinary:       Lump left breast  Musculoskeletal: Positive for back pain.  Skin: Negative.       Objective:   Physical Exam  Constitutional: She is oriented to person, place, and time. She appears well-developed and well-nourished.  HENT:  Head: Normocephalic and atraumatic.  Cardiovascular: Normal rate.   Pulmonary/Chest: Effort normal and breath sounds normal. No respiratory distress. She has no wheezes. She has no rales. She exhibits no tenderness.  Small soft tissue lump 6'oclock left breast which is freely mobile. No other abnormalities and no axillary LAD.   Abdominal: Soft.  Neurological: She is alert and oriented to person, place, and time.  Skin: Skin is warm and dry.   Vitals:   04/19/16 0931  BP: 102/62  Pulse: 64  Resp: 12  Temp: 98.4 F (36.9 C)  TempSrc: Oral  SpO2: 97%  Weight: 140 lb (63.5 kg)  Height: 5\' 6"  (1.676 m)      Assessment & Plan:

## 2016-04-19 NOTE — Assessment & Plan Note (Signed)
Ordered diagnostic mammogram, soft tissue felt on exam which is likely benign. Family hx breast cancer.

## 2016-04-24 ENCOUNTER — Other Ambulatory Visit: Payer: Self-pay | Admitting: Internal Medicine

## 2016-04-24 DIAGNOSIS — N6320 Unspecified lump in the left breast, unspecified quadrant: Secondary | ICD-10-CM

## 2016-04-30 ENCOUNTER — Encounter (HOSPITAL_COMMUNITY): Payer: Commercial Managed Care - HMO | Attending: Oncology | Admitting: Oncology

## 2016-04-30 ENCOUNTER — Encounter (HOSPITAL_BASED_OUTPATIENT_CLINIC_OR_DEPARTMENT_OTHER): Payer: Commercial Managed Care - HMO

## 2016-04-30 ENCOUNTER — Encounter (HOSPITAL_COMMUNITY): Payer: Self-pay | Admitting: Oncology

## 2016-04-30 DIAGNOSIS — M8580 Other specified disorders of bone density and structure, unspecified site: Secondary | ICD-10-CM | POA: Diagnosis not present

## 2016-04-30 DIAGNOSIS — E782 Mixed hyperlipidemia: Secondary | ICD-10-CM | POA: Diagnosis not present

## 2016-04-30 DIAGNOSIS — I10 Essential (primary) hypertension: Secondary | ICD-10-CM | POA: Insufficient documentation

## 2016-04-30 DIAGNOSIS — K922 Gastrointestinal hemorrhage, unspecified: Secondary | ICD-10-CM | POA: Diagnosis not present

## 2016-04-30 DIAGNOSIS — H409 Unspecified glaucoma: Secondary | ICD-10-CM | POA: Diagnosis not present

## 2016-04-30 DIAGNOSIS — K5909 Other constipation: Secondary | ICD-10-CM | POA: Diagnosis not present

## 2016-04-30 DIAGNOSIS — N6320 Unspecified lump in the left breast, unspecified quadrant: Secondary | ICD-10-CM | POA: Diagnosis not present

## 2016-04-30 DIAGNOSIS — K219 Gastro-esophageal reflux disease without esophagitis: Secondary | ICD-10-CM | POA: Diagnosis not present

## 2016-04-30 DIAGNOSIS — D500 Iron deficiency anemia secondary to blood loss (chronic): Secondary | ICD-10-CM | POA: Insufficient documentation

## 2016-04-30 DIAGNOSIS — F419 Anxiety disorder, unspecified: Secondary | ICD-10-CM | POA: Insufficient documentation

## 2016-04-30 DIAGNOSIS — E119 Type 2 diabetes mellitus without complications: Secondary | ICD-10-CM | POA: Insufficient documentation

## 2016-04-30 DIAGNOSIS — F329 Major depressive disorder, single episode, unspecified: Secondary | ICD-10-CM | POA: Diagnosis not present

## 2016-04-30 DIAGNOSIS — Z86010 Personal history of colonic polyps: Secondary | ICD-10-CM | POA: Insufficient documentation

## 2016-04-30 LAB — CBC WITH DIFFERENTIAL/PLATELET
BASOS ABS: 0 10*3/uL (ref 0.0–0.1)
Basophils Relative: 1 %
EOS ABS: 0 10*3/uL (ref 0.0–0.7)
Eosinophils Relative: 1 %
HCT: 40.2 % (ref 36.0–46.0)
HEMOGLOBIN: 12.9 g/dL (ref 12.0–15.0)
LYMPHS ABS: 1.9 10*3/uL (ref 0.7–4.0)
LYMPHS PCT: 51 %
MCH: 30.3 pg (ref 26.0–34.0)
MCHC: 32.1 g/dL (ref 30.0–36.0)
MCV: 94.4 fL (ref 78.0–100.0)
Monocytes Absolute: 0.2 10*3/uL (ref 0.1–1.0)
Monocytes Relative: 6 %
NEUTROS PCT: 43 %
Neutro Abs: 1.6 10*3/uL — ABNORMAL LOW (ref 1.7–7.7)
PLATELETS: 221 10*3/uL (ref 150–400)
RBC: 4.26 MIL/uL (ref 3.87–5.11)
RDW: 14.6 % (ref 11.5–15.5)
WBC: 3.8 10*3/uL — AB (ref 4.0–10.5)

## 2016-04-30 LAB — BASIC METABOLIC PANEL
ANION GAP: 10 (ref 5–15)
BUN: 10 mg/dL (ref 6–20)
CALCIUM: 9.5 mg/dL (ref 8.9–10.3)
CO2: 27 mmol/L (ref 22–32)
CREATININE: 0.89 mg/dL (ref 0.44–1.00)
Chloride: 104 mmol/L (ref 101–111)
GFR calc Af Amer: >60 mL/min (ref >60)
GLUCOSE: 112 mg/dL — AB (ref 65–99)
Potassium: 4 mmol/L (ref 3.5–5.1)
Sodium: 141 mmol/L (ref 135–145)

## 2016-04-30 LAB — IRON AND TIBC
IRON: 92 ug/dL (ref 28–170)
SATURATION RATIOS: 32 % — AB (ref 10.4–31.8)
TIBC: 288 ug/dL (ref 250–450)
UIBC: 196 ug/dL

## 2016-04-30 LAB — FERRITIN: FERRITIN: 125 ng/mL (ref 11–307)

## 2016-04-30 NOTE — Patient Instructions (Signed)
[place] at [facility] Discharge Instructions  RECOMMENDATIONS MADE BY THE CONSULTANT AND ANY TEST RESULTS WILL BE SENT TO YOUR REFERRING PHYSICIAN.  You were seen today by [name] PA-C. Labs today, will call with results. Return in 6 months for labs and a follow up.   Thank you for choosing [place] at [facility] to provide your oncology and hematology care.  To afford each patient quality time with our provider, please arrive at least 15 minutes before your scheduled appointment time.   Beginning January 23rd 2017 lab work for the [facility] will be done in the  Main lab at [facility] on 1st floor. If you have a lab appointment with the [facility] please come in thru the  Main Entrance and check in at the main information desk  You need to re-schedule your appointment should you arrive 10 or more minutes late.  We strive to give you quality time with our providers, and arriving late affects you and other patients whose appointments are after yours.  Also, if you no show three or more times for appointments you may be dismissed from the clinic at the providers discretion.     Again, thank you for choosing [facility].  Our hope is that these requests will decrease the amount of time that you wait before being seen by our physicians.       _____________________________________________________________  Should you have questions after your visit to [facility], please contact our office at [phone] between the hours of 8:30 a.m. and 4:30 p.m.  Voicemails left after 4:30 p.m. will not be returned until the following business day.  For prescription refill requests, have your pharmacy contact our office.         Resources For Cancer Patients and their Caregivers ? American Cancer Society: Can assist with transportation, wigs, general needs, runs Look Good Feel Better.         ([phone] ? Cancer Care: Provides financial assistance, online support groups, medication/co-pay assistance.  1-800-813-HOPE 819-310-3916) ? [facility] Assists [place] cancer patients and their families through emotional , educational and financial support.  930-331-9991 ? [place] DSS Where to apply for food stamps, Medicaid and utility assistance. 409-082-0023 ? [facility]: Transportation to medical appointments. 409 361 3812 ? Social Security Administration: May apply for disability if have a Stage IV cancer. 9062630216 (830)466-9584 ? [employer], Disability and Transit Services: Assists with nutrition, care and transit needs. [place] Support Programs: @10RELATIVEDAYS @ > Cancer Support Group  2nd Tuesday of the month 1pm-2pm, Journey Room  > Creative Journey  3rd Tuesday of the month 1130am-1pm, Journey Room  > Look Good Feel Better  1st Wednesday of the month 10am-12 noon, Journey Room (Call [place] to register [phone])

## 2016-04-30 NOTE — Progress Notes (Signed)
Monica Koch, MD [place] [id number]  Iron deficiency anemia due to chronic blood loss - Plan: CBC with Differential, Basic metabolic panel, Iron and TIBC, Ferritin  CURRENT THERAPY: IV iron replacement as indicated.  INTERVAL HISTORY: Monica [name] 75 y.o. female returns for followup of iron deficiency anemia secondary to chronic GI blood loss in the setting of gastroesophageal reflux disease with stricture, normal esophageal manometry, on long-term proton pump inhibitor therapy, status post dilatation.  Last colonoscopy by Dr. [name] on 09/21/2013 showing internal hemorrhoids and small sessile polyp measuring 3 mm in the distal transverse colon resulting in polypectomy; otherwise colon is normal.  She is doing well.  She denies any blood loss.    She does note a palpable left breast nodule.  She saw Dr. Sharlet Salina about this recently and she has the patient set-up for mammogram tomorrow.  She denies any complaints today.  Review of Systems  Constitutional: Negative.   HENT: Negative.   Eyes: Negative.   Respiratory: Negative.   Cardiovascular: Negative.   Gastrointestinal: Negative.  Negative for blood in stool and melena.  Genitourinary: Negative.   Musculoskeletal: Negative.   Skin: Negative.   Neurological: Negative.   Endo/Heme/Allergies: Negative.   Psychiatric/Behavioral: Negative.     Past Medical History:  Diagnosis Date  . ANXIETY   . Arthritis   . Chronic constipation   . 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 DEPRESSION, SITUATIONAL   . Diabetes mellitus ([place]) dx 02/2013  . GERD (gastroesophageal reflux disease)    w/ HH (EGD 2010)  . GLAUCOMA   . Hypertension   . Iron deficiency anemia due to chronic blood loss 02/05/2014  . LOW BACK PAIN, CHRONIC   . Mixed hyperlipidemia   . OSTEOPENIA   . Stricture and stenosis of esophagus 2006    Past Surgical History:  Procedure Laterality Date  . BACK SURGERY    .  ESOPHAGEAL MANOMETRY N/A 02/09/2013   Procedure: ESOPHAGEAL MANOMETRY (EM);  Surgeon: Sable Feil, MD;  Location: [facility];  Service: Endoscopy;  Laterality: N/A;  . EYE SURGERY    . HEMORROIDECTOMY    . HYSTEROSCOPY W/D&C  07/20/2011   Procedure: DILATATION AND CURETTAGE /HYSTEROSCOPY;  Surgeon: Melina Schools, MD;  Location: [facility];  Service: Gynecology;  Laterality: N/A;  . POLYPECTOMY  07/20/2011   Procedure: POLYPECTOMY;  Surgeon: Melina Schools, MD;  Location: [facility];  Service: Gynecology;  Laterality: N/A;  . ROTATOR CUFF REPAIR     right   . TUBAL LIGATION      Family History  Problem Relation Age of Onset  . Heart failure Father   . Diabetes Mother   . Breast cancer Sister   . Colon cancer Sister 70  . Diabetes Brother     Social History   Social History  . Marital status: Divorced    Spouse name: N/A  . Number of children: 5  . Years of education: N/A   Occupational History  . retired    Social History Main Topics  . Smoking status: Never Smoker  . Smokeless tobacco: Never Used  . Alcohol use No  . Drug use: No  . Sexual activity: No   Other Topics Concern  . None   Social History Narrative  . None     PHYSICAL EXAMINATION  ECOG PERFORMANCE STATUS: 0 - Asymptomatic  Vitals:   04/30/16 1403  BP: 121/80  Pulse: 80  Resp: 16  Temp: 98 F (36.7 C)    GENERAL:alert, healthy, no distress, well nourished, well developed, comfortable, cooperative, smiling and unaccompanied, very pleasant SKIN: skin color, texture, turgor are normal, no rashes or significant lesions HEAD: Normocephalic, No masses, lesions, tenderness or abnormalities EYES: normal, EOMI EARS: External ears normal OROPHARYNX:lips, buccal mucosa, and tongue normal and mucous membranes are moist  NECK: supple, trachea midline LYMPH:  not examined BREAST: right breast without any palpable abnormality.  Left breast with a 0.5 cm palpable nodule in the 6 o'clock position just  superior to the inframammary fold. LUNGS: clear to auscultation and percussion HEART: regular rate & rhythm, no murmurs, no gallops, S1 normal and S2 normal ABDOMEN:abdomen soft, non-tender, normal bowel sounds and no masses or organomegaly BACK: Back symmetric, no curvature., No CVA tenderness EXTREMITIES:less then 2 second capillary refill, no joint deformities, effusion, or inflammation, no edema, no skin discoloration, no clubbing, no cyanosis  NEURO: alert & oriented x 3 with fluent speech, no focal motor/sensory deficits, gait normal   LABORATORY DATA: CBC    Component Value Date/Time   WBC 3.8 (L) 04/30/2016 0934   RBC 4.26 04/30/2016 0934   HGB 12.9 04/30/2016 0934   HCT 40.2 04/30/2016 0934   PLT 221 04/30/2016 0934   MCV 94.4 04/30/2016 0934   MCH 30.3 04/30/2016 0934   MCHC 32.1 04/30/2016 0934   RDW 14.6 04/30/2016 0934   LYMPHSABS 1.9 04/30/2016 0934   MONOABS 0.2 04/30/2016 0934   EOSABS 0.0 04/30/2016 0934   BASOSABS 0.0 04/30/2016 0934      Chemistry      Component Value Date/Time   NA 141 04/30/2016 0934   K 4.0 04/30/2016 0934   CL 104 04/30/2016 0934   CO2 27 04/30/2016 0934   BUN 10 04/30/2016 0934   CREATININE 0.89 04/30/2016 0934      Component Value Date/Time   CALCIUM 9.5 04/30/2016 0934   ALKPHOS 56 06/16/2015 1024   AST 16 06/16/2015 1024   ALT 13 06/16/2015 1024   BILITOT 0.5 06/16/2015 1024      Lab Results  Component Value Date   IRON 92 04/30/2016   TIBC 288 04/30/2016   FERRITIN 125 04/30/2016     PENDING LABS:   RADIOGRAPHIC STUDIES:  No results found.   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  Last colonoscopy by  Dr. [name] on 09/21/2013 showing internal hemorrhoids and small sessile polyp measuring 3 mm in the distal transverse colon resulting in polypectomy; otherwise colon is normal.  Labs today: CBC diff, BMET, iron/TIBC, ferritin.  I personally reviewed and went over laboratory results with the patient.  The results are noted within this dictation.    Labs in 6 months: CBC diff, iron/TIBC, ferritin, BMET.  Palpable nodule noted in the 6 o'clock position of left breast measuring 5 mm, mobile, yet firm.    She is scheduled for mammogram tomorrow, 05/01/2016.  Return in 6 months for follow-up.   ORDERS PLACED FOR THIS ENCOUNTER: Orders Placed This Encounter  Procedures  . CBC with Differential  . Basic metabolic panel  . Iron and TIBC  . Ferritin    MEDICATIONS PRESCRIBED THIS ENCOUNTER: Meds ordered this encounter  Medications  . PREDNISOLONE ACETATE OP    Sig: Apply to eye.    THERAPY PLAN:  We will continue to monitor blood counts and provide IV iron when indicated.  All questions were answered. The patient knows to call the clinic with any problems, questions or concerns. We can certainly see the patient much sooner if necessary.  Patient and plan discussed with Dr. Ancil Linsey and she is in agreement with the aforementioned.   This note is electronically signed by: Doy Mince 04/30/2016 6:54 PM

## 2016-04-30 NOTE — Assessment & Plan Note (Addendum)
Iron deficiency anemia secondary to chronic GI blood loss requiring intermittent IV iron replacement in the setting of gastroesophageal reflux disease with stricture, normal esophageal manometry, on long-term proton pump inhibitor therapy, status post dilatation.  Last colonoscopy by Dr. [name] on 09/21/2013 showing internal hemorrhoids and small sessile polyp measuring 3 mm in the distal transverse colon resulting in polypectomy; otherwise colon is normal.  Labs today: CBC diff, BMET, iron/TIBC, ferritin.  I personally reviewed and went over laboratory results with the patient.  The results are noted within this dictation.    Labs in 6 months: CBC diff, iron/TIBC, ferritin, BMET.  Palpable nodule noted in the 6 o'clock position of left breast measuring 5 mm, mobile, yet firm.    She is scheduled for mammogram tomorrow, 05/01/2016.  Return in 6 months for follow-up.

## 2016-05-01 ENCOUNTER — Ambulatory Visit
Admission: RE | Admit: 2016-05-01 | Discharge: 2016-05-01 | Disposition: A | Discharge status: Home / Self Care | Payer: Commercial Managed Care - HMO | Source: Ambulatory Visit | Attending: Internal Medicine | Admitting: Internal Medicine

## 2016-05-01 ENCOUNTER — Other Ambulatory Visit: Payer: Self-pay | Admitting: Internal Medicine

## 2016-05-01 DIAGNOSIS — N6320 Unspecified lump in the left breast, unspecified quadrant: Secondary | ICD-10-CM

## 2016-05-01 DIAGNOSIS — N6324 Unspecified lump in the left breast, lower inner quadrant: Secondary | ICD-10-CM | POA: Diagnosis not present

## 2016-05-01 DIAGNOSIS — N6012 Diffuse cystic mastopathy of left breast: Secondary | ICD-10-CM | POA: Diagnosis not present

## 2016-05-08 ENCOUNTER — Telehealth: Payer: Self-pay | Admitting: Internal Medicine

## 2016-05-08 NOTE — Telephone Encounter (Signed)
Patient advised of dr crawfords instructions, will try that and call back later to make appt if no improvement

## 2016-05-08 NOTE — Telephone Encounter (Signed)
Pt request to speak to the assistant concern about lump that was under her breast. She is very concern about this. Please call her back

## 2016-05-08 NOTE — Telephone Encounter (Signed)
She can try tylenol and icing the area. Sometimes an ultrasound could cause some swelling and pain. If not improving can come for visit.

## 2016-05-08 NOTE — Telephone Encounter (Signed)
I talked with patient---she states she has been hurting in her breast everyday since the ultrasound---usually when she takes meloxicam, it helps with any kind of pain she has, but meloxicam is not helping pain this time and she is very concerned about it, even though she understands the findings on the mammogram and ultrasound results are showing normal---routing to dr crawford---do you want another office visit---please advise, thanks

## 2016-05-15 NOTE — Telephone Encounter (Signed)
See prior instruction, visit recommended for no resolution.

## 2016-05-15 NOTE — Telephone Encounter (Signed)
Pt has called back and she is still having pain and she says "she can't keep icing it all the time" she's wondering what she should do now. She can be reached at [phone]

## 2016-05-15 NOTE — Telephone Encounter (Signed)
Routing to dr crawford, please advise, thanks 

## 2016-05-18 DIAGNOSIS — N6323 Unspecified lump in the left breast, lower outer quadrant: Secondary | ICD-10-CM | POA: Diagnosis not present

## 2016-05-18 DIAGNOSIS — N644 Mastodynia: Secondary | ICD-10-CM | POA: Diagnosis not present

## 2016-06-01 ENCOUNTER — Other Ambulatory Visit: Payer: Self-pay | Admitting: Internal Medicine

## 2016-06-01 DIAGNOSIS — Z1231 Encounter for screening mammogram for malignant neoplasm of breast: Secondary | ICD-10-CM

## 2016-06-04 ENCOUNTER — Ambulatory Visit
Admission: RE | Admit: 2016-06-04 | Discharge: 2016-06-04 | Disposition: A | Discharge status: Home / Self Care | Payer: Commercial Managed Care - HMO | Source: Ambulatory Visit | Attending: Internal Medicine | Admitting: Internal Medicine

## 2016-06-04 DIAGNOSIS — Z1231 Encounter for screening mammogram for malignant neoplasm of breast: Secondary | ICD-10-CM

## 2016-06-18 ENCOUNTER — Ambulatory Visit (INDEPENDENT_AMBULATORY_CARE_PROVIDER_SITE_OTHER): Payer: Medicare HMO | Admitting: Internal Medicine

## 2016-06-18 ENCOUNTER — Other Ambulatory Visit (INDEPENDENT_AMBULATORY_CARE_PROVIDER_SITE_OTHER): Payer: Medicare HMO

## 2016-06-18 ENCOUNTER — Encounter: Payer: Self-pay | Admitting: Internal Medicine

## 2016-06-18 VITALS — BP 120/70 | HR 72 | Temp 97.7°F | Ht 66.0 in | Wt 141.5 lb

## 2016-06-18 DIAGNOSIS — E119 Type 2 diabetes mellitus without complications: Secondary | ICD-10-CM | POA: Diagnosis not present

## 2016-06-18 DIAGNOSIS — E782 Mixed hyperlipidemia: Secondary | ICD-10-CM

## 2016-06-18 DIAGNOSIS — Z0000 Encounter for general adult medical examination without abnormal findings: Secondary | ICD-10-CM | POA: Diagnosis not present

## 2016-06-18 DIAGNOSIS — I10 Essential (primary) hypertension: Secondary | ICD-10-CM

## 2016-06-18 HISTORY — DX: Encounter for general adult medical examination without abnormal findings: Z00.00

## 2016-06-18 LAB — COMPREHENSIVE METABOLIC PANEL
ALK PHOS: 51 U/L (ref 39–117)
ALT: 12 U/L (ref 0–35)
AST: 16 U/L (ref 0–37)
Albumin: 4.5 g/dL (ref 3.5–5.2)
BUN: 12 mg/dL (ref 6–23)
CO2: 29 meq/L (ref 19–32)
Calcium: 9.8 mg/dL (ref 8.4–10.5)
Chloride: 107 mEq/L (ref 96–112)
Creatinine, Ser: 0.83 mg/dL (ref 0.40–1.20)
GFR: 86.44 mL/min (ref >60.00)
GLUCOSE: 103 mg/dL — AB (ref 70–99)
POTASSIUM: 4.5 meq/L (ref 3.5–5.1)
SODIUM: 142 meq/L (ref 135–145)
TOTAL PROTEIN: 7.5 g/dL (ref 6.0–8.3)
Total Bilirubin: 0.8 mg/dL (ref 0.2–1.2)

## 2016-06-18 LAB — HEMOGLOBIN A1C: HEMOGLOBIN A1C: 6.5 % (ref 4.6–6.5)

## 2016-06-18 LAB — MICROALBUMIN / CREATININE URINE RATIO
Creatinine,U: 27 mg/dL
MICROALB/CREAT RATIO: 2.6 mg/g (ref 0.0–30.0)
Microalb, Ur: <0.7 mg/dL (ref 0.0–1.9)

## 2016-06-18 LAB — LIPID PANEL
Cholesterol: 133 mg/dL (ref 0–200)
HDL: 56.5 mg/dL (ref >39.00)
LDL CALC: 64 mg/dL (ref 0–99)
NONHDL: 76.58
Total CHOL/HDL Ratio: 2
Triglycerides: 65 mg/dL (ref 0.0–149.0)
VLDL: 13 mg/dL (ref 0.0–40.0)

## 2016-06-18 NOTE — Assessment & Plan Note (Signed)
Checking HgA1c, controlled with diet and not complicated. Not on ACE-I/ARB or statin and checking microalbumin to creatinine ratio and lipid panel. Adjust as needed. Foot exam up to date and eye exam up to date.

## 2016-06-18 NOTE — Progress Notes (Signed)
Pre visit review using our clinic review tool, if applicable. No additional management support is needed unless otherwise documented below in the visit note. 

## 2016-06-18 NOTE — Progress Notes (Signed)
   Subjective:    Patient ID: Monica [name], female    DOB: 04/01/1941, 76 y.o.   MRN: [id number]  HPI Here for medicare wellness and physical, no new complaints. Please see A/P for status and treatment of chronic medical problems.   Diet: DM since diabetic Physical activity: sedentary Depression/mood screen: negative Hearing: intact to whispered voice Visual acuity: grossly normal with lens, performs annual eye exam  ADLs: capable Fall risk: none Home safety: good Cognitive evaluation: intact to orientation, naming, recall and repetition EOL planning: adv directives discussed  I have personally reviewed and have noted 1. The patient's medical and social history - reviewed today no changes 2. Their use of alcohol, tobacco or illicit drugs 3. Their current medications and supplements 4. The patient's functional ability including ADL's, fall risks, home safety risks and hearing or visual impairment. 5. Diet and physical activities 6. Evidence for depression or mood disorders 7. Care team reviewed and updated (available in snapshot)  Review of Systems  Constitutional: Negative.   HENT: Negative.   Eyes: Negative.   Respiratory: Negative for cough, chest tightness and shortness of breath.   Cardiovascular: Negative for chest pain, palpitations and leg swelling.  Gastrointestinal: Positive for constipation. Negative for abdominal distention, abdominal pain, diarrhea, nausea and vomiting.       Chronic unchanged  Musculoskeletal: Negative.   Skin: Negative.   Neurological: Negative.   Psychiatric/Behavioral: Negative.       Objective:   Physical Exam  Constitutional: She is oriented to person, place, and time. She appears well-developed and well-nourished.  HENT:  Head: Normocephalic and atraumatic.  Eyes: EOM are normal.  Neck: Normal range of motion.  Cardiovascular: Normal rate and regular rhythm.   Pulmonary/Chest: Effort normal and breath sounds normal. No respiratory  distress. She has no wheezes. She has no rales.  Abdominal: Soft. Bowel sounds are normal. She exhibits no distension. There is no tenderness. There is no rebound.  Musculoskeletal: She exhibits no edema.  Neurological: She is alert and oriented to person, place, and time. Coordination normal.  Skin: Skin is warm and dry.  Psychiatric: She has a normal mood and affect.   Vitals:   06/18/16 0856  BP: 120/70  Pulse: 72  Temp: 97.7 F (36.5 C)  TempSrc: Oral  SpO2: 96%  Weight: 141 lb 8 oz (64.2 kg)  Height: 5\' 6"  (1.676 m)      Assessment & Plan:

## 2016-06-18 NOTE — Assessment & Plan Note (Signed)
BP at goal on amlodipine 5 mg daily, checking CMP and adjust as needed.  

## 2016-06-18 NOTE — Assessment & Plan Note (Signed)
Checking lipid panel, not on meds currently. Adjust as needed.

## 2016-06-18 NOTE — Patient Instructions (Signed)
We are checking the labs today and will call you back about the results.   Health Maintenance, Female Introduction Adopting a healthy lifestyle and getting preventive care can go a long way to promote health and wellness. Talk with your health care provider about what schedule of regular examinations is right for you. This is a good chance for you to check in with your provider about disease prevention and staying healthy. In between checkups, there are plenty of things you can do on your own. Experts have done a lot of research about which lifestyle changes and preventive measures are most likely to keep you healthy. Ask your health care provider for more information. Weight and diet Eat a healthy diet  Be sure to include plenty of vegetables, fruits, low-fat dairy products, and lean protein.  Do not eat a lot of foods high in solid fats, added sugars, or salt.  Get regular exercise. This is one of the most important things you can do for your health.  Most adults should exercise for at least 150 minutes each week. The exercise should increase your heart rate and make you sweat (moderate-intensity exercise).  Most adults should also do strengthening exercises at least twice a week. This is in addition to the moderate-intensity exercise. Maintain a healthy weight  Body mass index (BMI) is a measurement that can be used to identify possible weight problems. It estimates body fat based on height and weight. Your health care provider can help determine your BMI and help you achieve or maintain a healthy weight.  For females 35 years of age and older:  A BMI below 18.5 is considered underweight.  A BMI of 18.5 to 24.9 is normal.  A BMI of 25 to 29.9 is considered overweight.  A BMI of 30 and above is considered obese. Watch levels of cholesterol and blood lipids  You should start having your blood tested for lipids and cholesterol at 74 years of age, then have this test every 5  years.  You may need to have your cholesterol levels checked more often if:  Your lipid or cholesterol levels are high.  You are older than 74 years of age.  You are at high risk for heart disease. Cancer screening Lung Cancer  Lung cancer screening is recommended for adults 64-14 years old who are at high risk for lung cancer because of a history of smoking.  A yearly low-dose CT scan of the lungs is recommended for people who:  Currently smoke.  Have quit within the past 15 years.  Have at least a 30-pack-year history of smoking. A pack year is smoking an average of one pack of cigarettes a day for 1 year.  Yearly screening should continue until it has been 15 years since you quit.  Yearly screening should stop if you develop a health problem that would prevent you from having lung cancer treatment. Breast Cancer  Practice breast self-awareness. This means understanding how your breasts normally appear and feel.  It also means doing regular breast self-exams. Let your health care provider know about any changes, no matter how small.  If you are in your 20s or 30s, you should have a clinical breast exam (CBE) by a health care provider every 1-3 years as part of a regular health exam.  If you are 54 or older, have a CBE every year. Also consider having a breast X-ray (mammogram) every year.  If you have a family history of breast cancer, talk to your  health care provider about genetic screening.  If you are at high risk for breast cancer, talk to your health care provider about having an MRI and a mammogram every year.  Breast cancer gene (BRCA) assessment is recommended for women who have family members with BRCA-related cancers. BRCA-related cancers include:  Breast.  Ovarian.  Tubal.  Peritoneal cancers.  Results of the assessment will determine the need for genetic counseling and BRCA1 and BRCA2 testing. Cervical Cancer  Your health care provider may recommend  that you be screened regularly for cancer of the pelvic organs (ovaries, uterus, and vagina). This screening involves a pelvic examination, including checking for microscopic changes to the surface of your cervix (Pap test). You may be encouraged to have this screening done every 3 years, beginning at age 21.  For women ages 30-65, health care providers may recommend pelvic exams and Pap testing every 3 years, or they may recommend the Pap and pelvic exam, combined with testing for human papilloma virus (HPV), every 5 years. Some types of HPV increase your risk of cervical cancer. Testing for HPV may also be done on women of any age with unclear Pap test results.  Other health care providers may not recommend any screening for nonpregnant women who are considered low risk for pelvic cancer and who do not have symptoms. Ask your health care provider if a screening pelvic exam is right for you.  If you have had past treatment for cervical cancer or a condition that could lead to cancer, you need Pap tests and screening for cancer for at least 20 years after your treatment. If Pap tests have been discontinued, your risk factors (such as having a new sexual partner) need to be reassessed to determine if screening should resume. Some women have medical problems that increase the chance of getting cervical cancer. In these cases, your health care provider may recommend more frequent screening and Pap tests. Colorectal Cancer  This type of cancer can be detected and often prevented.  Routine colorectal cancer screening usually begins at 74 years of age and continues through 75 years of age.  Your health care provider may recommend screening at an earlier age if you have risk factors for colon cancer.  Your health care provider may also recommend using home test kits to check for hidden blood in the stool.  A small camera at the end of a tube can be used to examine your colon directly (sigmoidoscopy or  colonoscopy). This is done to check for the earliest forms of colorectal cancer.  Routine screening usually begins at age 50.  Direct examination of the colon should be repeated every 5-10 years through 75 years of age. However, you may need to be screened more often if early forms of precancerous polyps or small growths are found. Skin Cancer  Check your skin from head to toe regularly.  Tell your health care provider about any new moles or changes in moles, especially if there is a change in a mole's shape or color.  Also tell your health care provider if you have a mole that is larger than the size of a pencil eraser.  Always use sunscreen. Apply sunscreen liberally and repeatedly throughout the day.  Protect yourself by wearing long sleeves, pants, a wide-brimmed hat, and sunglasses whenever you are outside. Heart disease, diabetes, and high blood pressure  High blood pressure causes heart disease and increases the risk of stroke. High blood pressure is more likely to develop in:    People who have blood pressure in the high end of the normal range (130-139/85-89 mm Hg).  People who are overweight or obese.  People who are African American.  If you are 60-50 years of age, have your blood pressure checked every 3-5 years. If you are 33 years of age or older, have your blood pressure checked every year. You should have your blood pressure measured twice-once when you are at a hospital or clinic, and once when you are not at a hospital or clinic. Record the average of the two measurements. To check your blood pressure when you are not at a hospital or clinic, you can use:  An automated blood pressure machine at a pharmacy.  A home blood pressure monitor.  If you are between 27 years and 42 years old, ask your health care provider if you should take aspirin to prevent strokes.  Have regular diabetes screenings. This involves taking a blood sample to check your fasting blood sugar  level.  If you are at a normal weight and have a low risk for diabetes, have this test once every three years after 74 years of age.  If you are overweight and have a high risk for diabetes, consider being tested at a younger age or more often. Preventing infection Hepatitis B  If you have a higher risk for hepatitis B, you should be screened for this virus. You are considered at high risk for hepatitis B if:  You were born in a country where hepatitis B is common. Ask your health care provider which countries are considered high risk.  Your parents were born in a high-risk country, and you have not been immunized against hepatitis B (hepatitis B vaccine).  You have HIV or AIDS.  You use needles to inject street drugs.  You live with someone who has hepatitis B.  You have had sex with someone who has hepatitis B.  You get hemodialysis treatment.  You take certain medicines for conditions, including cancer, organ transplantation, and autoimmune conditions. Hepatitis C  Blood testing is recommended for:  Everyone born from [birth year] through 1963.  Anyone with known risk factors for hepatitis C. Sexually transmitted infections (STIs)  You should be screened for sexually transmitted infections (STIs) including gonorrhea and chlamydia if:  You are sexually active and are younger than 73 years of age.  You are older than 74 years of age and your health care provider tells you that you are at risk for this type of infection.  Your sexual activity has changed since you were last screened and you are at an increased risk for chlamydia or gonorrhea. Ask your health care provider if you are at risk.  If you do not have HIV, but are at risk, it may be recommended that you take a prescription medicine daily to prevent HIV infection. This is called pre-exposure prophylaxis (PrEP). You are considered at risk if:  You are sexually active and do not regularly use condoms or know the HIV status  of your partner(s).  You take drugs by injection.  You are sexually active with a partner who has HIV. Talk with your health care provider about whether you are at high risk of being infected with HIV. If you choose to begin PrEP, you should first be tested for HIV. You should then be tested every 3 months for as long as you are taking PrEP. Pregnancy  If you are premenopausal and you may become pregnant, ask your health care provider about  preconception counseling.  If you may become pregnant, take 400 to 800 micrograms (mcg) of folic acid every day.  If you want to prevent pregnancy, talk to your health care provider about birth control (contraception). Osteoporosis and menopause  Osteoporosis is a disease in which the bones lose minerals and strength with aging. This can result in serious bone fractures. Your risk for osteoporosis can be identified using a bone density scan.  If you are 65 years of age or older, or if you are at risk for osteoporosis and fractures, ask your health care provider if you should be screened.  Ask your health care provider whether you should take a calcium or vitamin D supplement to lower your risk for osteoporosis.  Menopause may have certain physical symptoms and risks.  Hormone replacement therapy may reduce some of these symptoms and risks. Talk to your health care provider about whether hormone replacement therapy is right for you. Follow these instructions at home:  Schedule regular health, dental, and eye exams.  Stay current with your immunizations.  Do not use any tobacco products including cigarettes, chewing tobacco, or electronic cigarettes.  If you are pregnant, do not drink alcohol.  If you are breastfeeding, limit how much and how often you drink alcohol.  Limit alcohol intake to no more than 1 drink per day for nonpregnant women. One drink equals 12 ounces of beer, 5 ounces of wine, or 1 ounces of hard liquor.  Do not use street  drugs.  Do not share needles.  Ask your health care provider for help if you need support or information about quitting drugs.  Tell your health care provider if you often feel depressed.  Tell your health care provider if you have ever been abused or do not feel safe at home. This information is not intended to replace advice given to you by your health care provider. Make sure you discuss any questions you have with your health care provider. Document Released: 11/13/2010 Document Revised: 10/06/2015 Document Reviewed: 02/01/2015  2017 Elsevier  

## 2016-06-18 NOTE — Assessment & Plan Note (Signed)
Declines pneumonia, shingles flu shots today. Colonoscopy and mammogram up to date. Counseled about sun safety and mole surveillance as well as the dangers of distracted driving. Given 10 year screening recommendations.

## 2016-06-22 DIAGNOSIS — N63 Unspecified lump in unspecified breast: Secondary | ICD-10-CM | POA: Diagnosis not present

## 2016-07-04 NOTE — Telephone Encounter (Signed)
Patient advised a month ago, see other note

## 2016-08-30 ENCOUNTER — Encounter: Payer: Self-pay | Admitting: Gastroenterology

## 2016-08-30 ENCOUNTER — Ambulatory Visit (INDEPENDENT_AMBULATORY_CARE_PROVIDER_SITE_OTHER): Payer: Medicare HMO | Admitting: Gastroenterology

## 2016-08-30 VITALS — BP 114/64 | HR 72 | Wt 141.0 lb

## 2016-08-30 DIAGNOSIS — D126 Benign neoplasm of colon, unspecified: Secondary | ICD-10-CM | POA: Diagnosis not present

## 2016-08-30 DIAGNOSIS — K5909 Other constipation: Secondary | ICD-10-CM | POA: Diagnosis not present

## 2016-08-30 DIAGNOSIS — K219 Gastro-esophageal reflux disease without esophagitis: Secondary | ICD-10-CM | POA: Diagnosis not present

## 2016-08-30 MED ORDER — OMEPRAZOLE 40 MG PO CPDR: 40.0000 mg | DELAYED_RELEASE_CAPSULE | Freq: Every day | ORAL | 3 refills | Status: DC

## 2016-08-30 NOTE — Progress Notes (Signed)
HPI :  76 y/o female known to our clinic from prior visits with Dr. [name], last seen on 09/2013 for history of iron deficiency anemia.  She is here today to establish with me for constipation. She endorses constipation chronically. She has tried a variety of regimens over the years. She has a hard time passing stools. She is having a bowel movement on average 3 times per week. She reports having a history of impaction remotely when she "did not use anything to help for a while". She is currently taking Miralax as needed, probiotics. She is not taking Miralax routinely, only as needed. She had tried it twice daily and had frequent of stools, multiple times per day. She reports taking Miralax once daily worked generally okay but has "stomach swelling" with it and did not like the way it made her feel.   Labs from 04/2016 show resolution of iron deficiency anemia. Normal Hgb. She has been on IV iron in the past.   She takes Dexilant 60mg  daily for a long time for history of reflux. It works well to control her heartburn. No breakthrough symptoms. No dysphagia. She can't recall other regimens she has been on in the past.   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    Past Medical History:  Diagnosis Date  . ANXIETY   . Arthritis   . Chronic constipation   . 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 DEPRESSION, SITUATIONAL   . Diabetes mellitus ([place]) dx 02/2013  . GERD (gastroesophageal reflux disease)    w/ HH (EGD 2010)  . GLAUCOMA   . Hypertension   . Iron deficiency anemia due to chronic blood loss 02/05/2014  . LOW BACK PAIN, CHRONIC   . Mixed hyperlipidemia   . OSTEOPENIA   . Stricture and stenosis of esophagus 2006     Past Surgical History:    Procedure Laterality Date  . BACK SURGERY    . ESOPHAGEAL MANOMETRY N/A 02/09/2013   Procedure: ESOPHAGEAL MANOMETRY (EM);  Surgeon: Sable Feil, MD;  Location: [facility];  Service: Endoscopy;  Laterality: N/A;  . EYE SURGERY    . HEMORROIDECTOMY    . HYSTEROSCOPY W/D&C  07/20/2011   Procedure: DILATATION AND CURETTAGE /HYSTEROSCOPY;  Surgeon: Melina Schools, MD;  Location: [facility];  Service: Gynecology;  Laterality: N/A;  . POLYPECTOMY  07/20/2011   Procedure: POLYPECTOMY;  Surgeon: Melina Schools, MD;  Location: [facility];  Service: Gynecology;  Laterality: N/A;  . ROTATOR CUFF REPAIR     right   . TUBAL LIGATION     Family History  Problem Relation Age of Onset  . Heart failure Father   . Diabetes Mother   . Breast cancer Sister   . Colon cancer Sister 73  . Diabetes Brother    Social History  Substance Use Topics  . Smoking status: Never Smoker  . Smokeless tobacco: Never Used  . Alcohol use No   Current Outpatient Prescriptions  Medication Sig Dispense Refill  . amLODipine (NORVASC) 5 MG tablet Take 1 tablet (5 mg total) by mouth daily. 90 tablet 2  . aspirin 81 MG tablet Take 81 mg by mouth daily.      [name] atorvastatin (LIPITOR) 10 MG tablet Take 1 tablet (10 mg total) by  mouth daily. 90 tablet 2  . Cholecalciferol (VITAMIN D3) 1000 UNITS CAPS Take 1,000 Units by mouth daily.    [name] DEXILANT 60 MG capsule TAKE ONE CAPSULE BY MOUTH ONCE DAILY 90 capsule 1  . meloxicam (MOBIC) 7.5 MG tablet Take 1 tablet (7.5 mg total) by mouth daily. 30 tablet 2  . Probiotic Product (PROBIOTIC DAILY PO) Take 1 capsule by mouth daily.     No current facility-administered medications for this visit.    Allergies  Allergen Reactions  . Codeine     REACTION: Upset stomach  . Hydrochlorothiazide     REACTION: Nausea and headache     Review of Systems: All systems reviewed and negative except where noted in HPI.   Lab Results  Component Value Date   WBC 3.8 (L) 04/30/2016   HGB  12.9 04/30/2016   HCT 40.2 04/30/2016   MCV 94.4 04/30/2016   PLT 221 04/30/2016    Lab Results  Component Value Date   CREATININE 0.83 06/18/2016   BUN 12 06/18/2016   NA 142 06/18/2016   K 4.5 06/18/2016   CL 107 06/18/2016   CO2 29 06/18/2016    Lab Results  Component Value Date   ALT 12 06/18/2016   AST 16 06/18/2016   ALKPHOS 51 06/18/2016   BILITOT 0.8 06/18/2016     Physical Exam: BP 114/64   Pulse 72   Wt 141 lb (64 kg)   BMI 22.76 kg/m  Constitutional: Pleasant,well-developed, female in no acute distress. HEENT: Normocephalic and atraumatic. Conjunctivae are normal. No scleral icterus. Neck supple.  Cardiovascular: Normal rate, regular rhythm.  Pulmonary/chest: Effort normal and breath sounds normal. No wheezing, rales or rhonchi. Abdominal: Soft, nondistended, nontender.  There are no masses palpable. No hepatomegaly. Extremities: no edema Lymphadenopathy: No cervical adenopathy noted. Neurological: Alert and oriented to person place and time. Skin: Skin is warm and dry. No rashes noted. Psychiatric: Normal mood and affect. Behavior is normal.   ASSESSMENT AND PLAN: 76 year old female here for a follow-up visit to discuss the following issues:  Chronic constipation - on a variety of regimens in the past, MiraLAX twice daily has worked well to increased stool frequency however she doesn't like the way this makes her feel. I offered her a trial of Linzess to see if she may tolerate this better, I discussed the risks and benefits and she wished proceed. I gave her a trial for 145 g once daily. If this works for her we can prescribe it for her longer term. If she doesn't like the way it this makes her feel she can consider going back to taking MiraLAX every other day to daily if she tolerates it. Colon cancer screening up-to-date  GERD - on long-term Dexilant with control of symptoms. Prior EGD showed no evidence of Barrett's. I discussed long-term potential  risks associated with PPIs. Recommend we try switching her off of Dexilant to generic omeprazole due to cost, and taper down to the lowest daily dose needed to control her symptoms. She states she does not think she has been on omeprazole in the past, we'll try 40 mg once day. If this works well for her we can consider decreasing to 20 mg once daily after a few months. She agreed.  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  [place]uey[place] Cellar, MD [facility] Pager [phone]

## 2016-08-30 NOTE — Patient Instructions (Signed)
If you are age 74 or older, your body mass index should be between 23-30. Your Body mass index is 22.76 kg/m. If this is out of the aforementioned range listed, please consider follow up with your Primary Care Provider.  If you are age 55 or younger, your body mass index should be between 19-25. Your Body mass index is 22.76 kg/m. If this is out of the aformentioned range listed, please consider follow up with your Primary Care Provider.   We have sent the following medications to your pharmacy for you to pick up at your convenience:  Omeprazole  Please discontinue Dexilant.  We have given you samples of Linzess today. Take daily as instructed.  You will be contacted for your next Colonoscopy by letter. This is due in 2020.  Thank you.

## 2016-08-31 DIAGNOSIS — H401111 Primary open-angle glaucoma, right eye, mild stage: Secondary | ICD-10-CM | POA: Diagnosis not present

## 2016-08-31 LAB — HM DIABETES EYE EXAM

## 2016-09-11 ENCOUNTER — Telehealth: Payer: Self-pay | Admitting: Gastroenterology

## 2016-09-11 ENCOUNTER — Other Ambulatory Visit: Payer: Self-pay

## 2016-09-11 MED ORDER — LINACLOTIDE 145 MCG PO CAPS: 145.0000 ug | ORAL_CAPSULE | Freq: Every day | ORAL | 3 refills | Status: DC

## 2016-09-11 NOTE — Telephone Encounter (Signed)
Rx sent to [facility] in [place]. 

## 2016-09-26 ENCOUNTER — Telehealth: Payer: Self-pay | Admitting: Gastroenterology

## 2016-09-26 NOTE — Telephone Encounter (Signed)
Spoke to patient, she said that the Linzess was working for a couple of days but then stopped working for her and made her feel bloated. She stopped taking it and had to use a suppository to have a bm. Looking at last note you had stated that if she didn't like the way it made her feel to go back to trying the Miralax every other day to daily. She will try this. She also noted a "small bump" on her anus. Denies any blood or drainage. Suggested she keep an eye on this and if she would like to have this checked she could make an appointment here or at her primary care physician to have them take a look. Let her know that if she has questions or concerns to contact our office.

## 2016-09-26 NOTE — Telephone Encounter (Signed)
I agree with your recommendations. She can call back if symptoms persist. Thanks

## 2016-10-10 DIAGNOSIS — K648 Other hemorrhoids: Secondary | ICD-10-CM | POA: Diagnosis not present

## 2016-10-11 ENCOUNTER — Other Ambulatory Visit: Payer: Self-pay | Admitting: Internal Medicine

## 2016-10-12 NOTE — Telephone Encounter (Signed)
Refill done.  

## 2016-10-29 ENCOUNTER — Ambulatory Visit (HOSPITAL_COMMUNITY): Payer: Commercial Managed Care - HMO | Admitting: Oncology

## 2016-10-29 ENCOUNTER — Other Ambulatory Visit (HOSPITAL_COMMUNITY): Payer: Medicare HMO

## 2016-10-29 ENCOUNTER — Other Ambulatory Visit (HOSPITAL_COMMUNITY): Payer: Commercial Managed Care - HMO

## 2016-10-29 ENCOUNTER — Ambulatory Visit (HOSPITAL_COMMUNITY): Payer: Self-pay | Admitting: Oncology

## 2016-10-29 NOTE — Progress Notes (Signed)
Monica [name], [place] [id number]   CLINIC:  Medical Oncology/Hematology  PCP:  [name], MD [place] [address] [phone]   REASON FOR VISIT:  Follow-up for Iron deficiency anemia   CURRENT THERAPY: IV iron prn     HISTORY OF PRESENT ILLNESS:  (From Monica Crigler, PA-C's last note on 04/30/16)     INTERVAL HISTORY:  Ms. [name] 76 y.o. female returns for routine follow-up for iron deficiency anemia, thought to be secondary to GI blood loss.    Overall [name] tells me that she has been feeling quite well. Her appetite and energy levels are both 75%. She struggles with occasional constipation, which is managed I an outside provider. She was given a prescription for Linzess, which caused her to have abdominal pain so she quit taking this medication. She resumed taking MiraLAX every other day, which is helpful. She endorses occasional night sweats, which she thinks her hot flashes" they come and go." She has chronic arthralgias, particularly to her upper extremities with history of shoulder surgeries per her report.  She sees her PCP, Dr. Sharlet Salina regularly. She recently stopped her blood pressure medicines due to dizziness; reports that her blood pressure was 100/60 is at home. States she called her pharmacist who recommended that she hold her blood pressure medicines. Her dizziness has resolved since stopping her BP meds.   She denies any frank bleeding episodes including blood in her stool, dark/tarry stools, hematuria, vaginal bleeding, nosebleeds, or gingival bleeding. Denies pica or pagophagia.  She reports that she occasionally will see "pale red spots in the toilet when I'm constipated." She does have a history of hemorrhoids, which is chronic. States that she is up-to-date with her colonoscopy, and next procedure is due in 2020. She is also up-to-date with her mammogram; breast nodule was noted on recent  mammogram and follow-up diagnostic imaging revealed no evidence of malignancy.  IV iron administration record:  Oncology Flowsheet 12/24/2013 12/30/2013  ferumoxytol (FERAHEME) IV 510 mg 510 mg    REVIEW OF SYSTEMS:  Review of Systems  Constitutional: Positive for fatigue. Negative for chills and fever.  HENT:  Negative.  Negative for lump/mass and nosebleeds.   Eyes: Negative.   Respiratory: Negative.  Negative for cough and shortness of breath.   Cardiovascular: Negative.  Negative for chest pain and leg swelling.  Gastrointestinal: Positive for constipation. Negative for abdominal pain, blood in stool, diarrhea, nausea and vomiting.  Endocrine: Positive for hot flashes.  Genitourinary: Negative.  Negative for dysuria and hematuria.   Musculoskeletal: Positive for arthralgias.  Skin: Negative.  Negative for rash.  Neurological: Negative.  Negative for dizziness and headaches.  Hematological: Negative.  Negative for adenopathy. Does not bruise/bleed easily.  Psychiatric/Behavioral: Negative.  Negative for depression and sleep disturbance. The patient is not nervous/anxious.      PAST MEDICAL/SURGICAL HISTORY:  Past Medical History:  Diagnosis Date  . ANXIETY   . Arthritis   . Chronic constipation   . 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 DEPRESSION, SITUATIONAL   . Diabetes mellitus ([facility]) dx 02/2013  . GERD (gastroesophageal reflux disease)    w/ HH (EGD 2010)  . GLAUCOMA   . Hypertension   . Iron deficiency anemia due to chronic blood loss 02/05/2014  . LOW BACK PAIN, CHRONIC   . Mixed hyperlipidemia   . OSTEOPENIA   . Stricture and stenosis of esophagus 2006   Past Surgical  History:  Procedure Laterality Date  . BACK SURGERY    . ESOPHAGEAL MANOMETRY N/A 02/09/2013   Procedure: ESOPHAGEAL MANOMETRY (EM);  Surgeon: Sable Feil, MD;  Location: [facility];  Service: Endoscopy;  Laterality: N/A;  . EYE SURGERY    . HEMORROIDECTOMY    . HYSTEROSCOPY W/D&C   07/20/2011   Procedure: DILATATION AND CURETTAGE /HYSTEROSCOPY;  Surgeon: Melina Schools, MD;  Location: [facility];  Service: Gynecology;  Laterality: N/A;  . POLYPECTOMY  07/20/2011   Procedure: POLYPECTOMY;  Surgeon: Melina Schools, MD;  Location: [facility];  Service: Gynecology;  Laterality: N/A;  . ROTATOR CUFF REPAIR     right   . TUBAL LIGATION       SOCIAL HISTORY:  Social History   Social History  . Marital status: Divorced    Spouse name: N/A  . Number of children: 5  . Years of education: N/A   Occupational History  . retired    Social History Main Topics  . Smoking status: Never Smoker  . Smokeless tobacco: Never Used  . Alcohol use No  . Drug use: No  . Sexual activity: No   Other Topics Concern  . Not on file   Social History Narrative  . No narrative on file    FAMILY HISTORY:  Family History  Problem Relation Age of Onset  . Heart failure Father   . Diabetes Mother   . Breast cancer Sister   . Colon cancer Sister 45  . Diabetes Brother     CURRENT MEDICATIONS:  Outpatient Encounter Prescriptions as of 10/31/2016  Medication Sig  . amLODipine (NORVASC) 5 MG tablet Take 1 tablet (5 mg total) by mouth daily. (Patient not taking: Reported on 10/31/2016)  . amLODipine (NORVASC) 5 MG tablet TAKE ONE TABLET BY MOUTH ONCE DAILY (Patient not taking: Reported on 10/31/2016)  . aspirin 81 MG tablet Take 81 mg by mouth daily.    [name] atorvastatin (LIPITOR) 10 MG tablet TAKE ONE TABLET BY MOUTH ONCE DAILY (Patient not taking: Reported on 10/31/2016)  . Cholecalciferol (VITAMIN D3) 1000 UNITS CAPS Take 1,000 Units by mouth daily.  [name] linaclotide (LINZESS) 145 MCG CAPS capsule Take 1 capsule (145 mcg total) by mouth daily before breakfast. (Patient not taking: Reported on 10/31/2016)  . meloxicam (MOBIC) 7.5 MG tablet Take 1 tablet (7.5 mg total) by mouth daily. (Patient not taking: Reported on 10/31/2016)  . omeprazole (PRILOSEC) 40 MG capsule Take 1 capsule (40 mg total) by  mouth daily. (Patient not taking: Reported on 10/31/2016)  . Probiotic Product (PROBIOTIC DAILY PO) Take 1 capsule by mouth daily.   No facility-administered encounter medications on file as of 10/31/2016.     ALLERGIES:  Allergies  Allergen Reactions  . Codeine     REACTION: Upset stomach  . Hydrochlorothiazide     REACTION: Nausea and headache     PHYSICAL EXAM:  ECOG Performance status: 0-1 - Mildly symptomatic; remains independent.   Vitals:   10/31/16 1327  BP: (!) 144/82  Pulse: 85  Resp: 18  Temp: 97.8 F (36.6 C)   Filed Weights   10/31/16 1327  Weight: 145 lb (65.8 kg)    Physical Exam  Constitutional: She is oriented to person, place, and time and well-developed, well-nourished, and in no distress.  HENT:  Head: Normocephalic.  Mouth/Throat: Oropharynx is clear and moist. No oropharyngeal exudate.  Eyes: Conjunctivae are normal. Pupils are equal, round, and reactive to light. No scleral icterus.  Neck:  Normal range of motion. Neck supple.  Cardiovascular: Normal rate, regular rhythm and normal heart sounds.   Pulmonary/Chest: Effort normal and breath sounds normal. No respiratory distress. She has no wheezes. She has no rales.  Abdominal: Soft. Bowel sounds are normal. There is no tenderness. There is no rebound and no guarding.  Musculoskeletal: Normal range of motion. She exhibits no edema.  Lymphadenopathy:    She has no cervical adenopathy.       Right: No supraclavicular adenopathy present.       Left: No supraclavicular adenopathy present.  Neurological: She is alert and oriented to person, place, and time. No cranial nerve deficit. Gait normal.  Skin: Skin is warm and dry. No rash noted.  Psychiatric: Mood, memory, affect and judgment normal.  Nursing note and vitals reviewed.    LABORATORY DATA:  I have reviewed the labs as listed.  CBC    Component Value Date/Time   WBC 3.9 (L) 10/31/2016 1155   RBC 4.31 10/31/2016 1155   HGB 13.0  10/31/2016 1155   HCT 40.3 10/31/2016 1155   PLT 219 10/31/2016 1155   MCV 93.5 10/31/2016 1155   MCH 30.2 10/31/2016 1155   MCHC 32.3 10/31/2016 1155   RDW 14.2 10/31/2016 1155   LYMPHSABS 1.9 10/31/2016 1155   MONOABS 0.2 10/31/2016 1155   EOSABS 0.0 10/31/2016 1155   BASOSABS 0.0 10/31/2016 1155   CMP Latest Ref Rng & Units 10/31/2016 06/18/2016 04/30/2016  Glucose 65 - 99 mg/dL 104(H) 103(H) 112(H)  BUN 6 - 20 mg/dL 14 12 10   Creatinine 0.44 - 1.00 mg/dL 0.82 0.83 0.89  Sodium 135 - 145 mmol/L 139 142 141  Potassium 3.5 - 5.1 mmol/L 4.1 4.5 4.0  Chloride 101 - 111 mmol/L 104 107 104  CO2 22 - 32 mmol/L 27 29 27   Calcium 8.9 - 10.3 mg/dL 9.7 9.8 9.5  Total Protein 6.0 - 8.3 g/dL - 7.5 -  Total Bilirubin 0.2 - 1.2 mg/dL - 0.8 -  Alkaline Phos 39 - 117 U/L - 51 -  AST 0 - 37 U/L - 16 -  ALT 0 - 35 U/L - 12 -     Ref. Range 07/29/2014 10:00 10/28/2014 10:23 04/28/2015 10:25 10/28/2015 09:26 04/30/2016 09:34  Ferritin Latest Ref Range: 11 - 307 ng/mL 212 145 128 138 125   PENDING LABS:  Iron studies pending    DIAGNOSTIC IMAGING:  *The following radiologic images and reports have been reviewed independently and agree with below findings.  Last colonoscopy: 09/21/13     PATHOLOGY:          ASSESSMENT & PLAN:   Iron deficiency anemia:  -Thought to be secondary to chronic GI blood loss.  -Has not required dose of IV iron in 3+ years.   -Last colonoscopy in 09/2013 with Dr. [name]; recommended repeat exam in 5 years.  -Hemoglobin normal and stable today at 13 g/dL. Iron studies are pending, but I suspect will be normal.  Ferritin has remained >100 since 07/2014.  -Discussed with her that since she has not required IV iron in 3+ years, I feel comfortable releasing her from follow-up here at the cancer center.  Of course, if she has recurrent symptoms or concerns for anemia, new episodes of bleeding, etc, then we would be happy to see her again. Patient agreed with this  plan.   Addendum:  -Iron studies resulted after patient left clinic today. Ferritin remains adequate at 95, with normal TIBC and saturation ratios. We  are happy to see her in the future if needed; otherwise she will follow-up with her PCP as directed.   Breast nodule:  -Noted on previous visit. Diagnostic mammogram on 05/01/16 negative for malignancy; area of palpable concern cluster of microcysts. Routine bilateral screening mammogram done on 06/04/16 and was negative as well.   -Continue follow-up with PCP as directed.     Dispo:  -No follow-up at cancer center needed at this time.  She is welcome to return at any time in the future, if needed. I wished her well and encouraged her to call us in the future, if needed.     Plan of care discussed with Dr. Talbert Cage, who agrees with the above aforementioned.    Orders placed this encounter:  No orders of the defined types were placed in this encounter.     Mike Craze, NP [place] [phone]

## 2016-10-31 ENCOUNTER — Encounter (HOSPITAL_COMMUNITY): Payer: Medicare HMO | Attending: Oncology

## 2016-10-31 ENCOUNTER — Encounter (HOSPITAL_BASED_OUTPATIENT_CLINIC_OR_DEPARTMENT_OTHER): Payer: Medicare HMO | Admitting: Adult Health

## 2016-10-31 ENCOUNTER — Encounter (HOSPITAL_COMMUNITY): Payer: Self-pay | Admitting: Adult Health

## 2016-10-31 VITALS — BP 144/82 | HR 85 | Temp 97.8°F | Resp 18 | Ht 66.0 in | Wt 145.0 lb

## 2016-10-31 DIAGNOSIS — D500 Iron deficiency anemia secondary to blood loss (chronic): Secondary | ICD-10-CM

## 2016-10-31 DIAGNOSIS — K922 Gastrointestinal hemorrhage, unspecified: Secondary | ICD-10-CM | POA: Diagnosis not present

## 2016-10-31 LAB — IRON AND TIBC
Iron: 80 ug/dL (ref 28–170)
Saturation Ratios: 26 % (ref 10.4–31.8)
TIBC: 308 ug/dL (ref 250–450)
UIBC: 228 ug/dL

## 2016-10-31 LAB — CBC WITH DIFFERENTIAL/PLATELET
Basophils Absolute: 0 10*3/uL (ref 0.0–0.1)
Basophils Relative: 0 %
EOS ABS: 0 10*3/uL (ref 0.0–0.7)
EOS PCT: 0 %
HCT: 40.3 % (ref 36.0–46.0)
Hemoglobin: 13 g/dL (ref 12.0–15.0)
LYMPHS ABS: 1.9 10*3/uL (ref 0.7–4.0)
LYMPHS PCT: 49 %
MCH: 30.2 pg (ref 26.0–34.0)
MCHC: 32.3 g/dL (ref 30.0–36.0)
MCV: 93.5 fL (ref 78.0–100.0)
MONO ABS: 0.2 10*3/uL (ref 0.1–1.0)
Monocytes Relative: 6 %
Neutro Abs: 1.8 10*3/uL (ref 1.7–7.7)
Neutrophils Relative %: 45 %
PLATELETS: 219 10*3/uL (ref 150–400)
RBC: 4.31 MIL/uL (ref 3.87–5.11)
RDW: 14.2 % (ref 11.5–15.5)
WBC: 3.9 10*3/uL — ABNORMAL LOW (ref 4.0–10.5)

## 2016-10-31 LAB — BASIC METABOLIC PANEL
Anion gap: 8 (ref 5–15)
BUN: 14 mg/dL (ref 6–20)
CALCIUM: 9.7 mg/dL (ref 8.9–10.3)
CO2: 27 mmol/L (ref 22–32)
CREATININE: 0.82 mg/dL (ref 0.44–1.00)
Chloride: 104 mmol/L (ref 101–111)
GFR calc Af Amer: >60 mL/min (ref >60)
Glucose, Bld: 104 mg/dL — ABNORMAL HIGH (ref 65–99)
Potassium: 4.1 mmol/L (ref 3.5–5.1)
Sodium: 139 mmol/L (ref 135–145)

## 2016-10-31 LAB — FERRITIN: Ferritin: 95 ng/mL (ref 11–307)

## 2016-11-08 ENCOUNTER — Ambulatory Visit (INDEPENDENT_AMBULATORY_CARE_PROVIDER_SITE_OTHER): Payer: Medicare HMO | Admitting: Nurse Practitioner

## 2016-11-08 VITALS — BP 132/84 | HR 74 | Temp 97.8°F | Ht 66.0 in | Wt 144.0 lb

## 2016-11-08 DIAGNOSIS — I10 Essential (primary) hypertension: Secondary | ICD-10-CM

## 2016-11-08 DIAGNOSIS — R42 Dizziness and giddiness: Secondary | ICD-10-CM

## 2016-11-08 NOTE — Patient Instructions (Signed)
Continue BP check once a day and records. Call office if BP >140/90 for more than 2days.  Return to office if lightheadedness does not improve in 1week.  Encourage adequate oral hydration.

## 2016-11-08 NOTE — Progress Notes (Signed)
Subjective:  Patient ID: Monica [name], female    DOB: 02/24/1941  Age: 76 y.o. MRN: [id number]  CC: Follow-up (6 mo fu/Low BP 107/67/dizzy/headache/ skin dry and itching?)   Dizziness  This is a new problem. The current episode started 1 to 4 weeks ago. The problem occurs intermittently. The problem has been waxing and waning. Pertinent negatives include no abdominal pain, anorexia, arthralgias, change in bowel habit, chest pain, chills, congestion, coughing, diaphoresis, fatigue, fever, headaches, myalgias, neck pain, numbness, rash, vertigo, visual change, vomiting or weakness. The symptoms are aggravated by walking. She has tried rest for the symptoms.   HTN: Has noticed low BP with dizziness spells. Home BP reading of 104/60  Outpatient Medications Prior to Visit  Medication Sig Dispense Refill  . aspirin 81 MG tablet Take 81 mg by mouth daily.      [name] atorvastatin (LIPITOR) 10 MG tablet TAKE ONE TABLET BY MOUTH ONCE DAILY 90 tablet 2  . Cholecalciferol (VITAMIN D3) 1000 UNITS CAPS Take 1,000 Units by mouth daily.    . meloxicam (MOBIC) 7.5 MG tablet Take 1 tablet (7.5 mg total) by mouth daily. 30 tablet 2  . Probiotic Product (PROBIOTIC DAILY PO) Take 1 capsule by mouth daily.    [name] amLODipine (NORVASC) 5 MG tablet Take 1 tablet (5 mg total) by mouth daily. 90 tablet 2  . amLODipine (NORVASC) 5 MG tablet TAKE ONE TABLET BY MOUTH ONCE DAILY 90 tablet 1  . linaclotide (LINZESS) 145 MCG CAPS capsule Take 1 capsule (145 mcg total) by mouth daily before breakfast. 90 capsule 3  . omeprazole (PRILOSEC) 40 MG capsule Take 1 capsule (40 mg total) by mouth daily. 90 capsule 3   No facility-administered medications prior to visit.     ROS See HPI  Objective:  BP 132/84   Pulse 74   Temp 97.8 F (36.6 C)   Ht 5\' 6"  (1.676 m)   Wt 144 lb (65.3 kg)   SpO2 98%   BMI 23.24 kg/m   BP Readings from Last 3 Encounters:  11/08/16 132/84  10/31/16 (!) 144/82  08/30/16 114/64    Wt  Readings from Last 3 Encounters:  11/08/16 144 lb (65.3 kg)  10/31/16 145 lb (65.8 kg)  08/30/16 141 lb (64 kg)    Physical Exam  Constitutional: She is oriented to person, place, and time. No distress.  HENT:  Right Ear: External ear normal.  Left Ear: External ear normal.  Nose: Nose normal.  Mouth/Throat: No oropharyngeal exudate.  Eyes: No scleral icterus.  Neck: Normal range of motion. Neck supple. No thyromegaly present.  Cardiovascular: Normal rate, regular rhythm and normal heart sounds.   Pulmonary/Chest: Effort normal and breath sounds normal. No respiratory distress.  Abdominal: Soft. She exhibits no distension.  Musculoskeletal: Normal range of motion. She exhibits no edema.  Lymphadenopathy:    She has no cervical adenopathy.  Neurological: She is alert and oriented to person, place, and time.  Skin: Skin is warm and dry.  Psychiatric: She has a normal mood and affect. Her behavior is normal.  Vitals reviewed.   Lab Results  Component Value Date   WBC 3.9 (L) 10/31/2016   HGB 13.0 10/31/2016   HCT 40.3 10/31/2016   PLT 219 10/31/2016   GLUCOSE 104 (H) 10/31/2016   CHOL 133 06/18/2016   TRIG 65.0 06/18/2016   HDL 56.50 06/18/2016   LDLCALC 64 06/18/2016   ALT 12 06/18/2016   AST 16 06/18/2016   NA  139 10/31/2016   K 4.1 10/31/2016   CL 104 10/31/2016   CREATININE 0.82 10/31/2016   BUN 14 10/31/2016   CO2 27 10/31/2016   TSH 1.74 09/14/2013   HGBA1C 6.5 06/18/2016   MICROALBUR <0.7 06/18/2016    Mm Digital Screening Bilateral  Result Date: 06/04/2016 CLINICAL DATA:  Screening. EXAM: DIGITAL SCREENING BILATERAL MAMMOGRAM WITH CAD COMPARISON:  Previous exam(s). ACR Breast Density Category b: There are scattered areas of fibroglandular density. FINDINGS: There are no findings suspicious for malignancy. Images were processed with CAD. IMPRESSION: No mammographic evidence of malignancy. A result letter of this screening mammogram will be mailed directly to  the patient. RECOMMENDATION: Screening mammogram in one year. (Code:SM-B-01Y) BI-RADS CATEGORY  1: Negative. Electronically Signed   By: Lajean Manes M.D.   On: 06/04/2016 11:52    Assessment & Plan:   [name] was seen today for follow-up.  Diagnoses and all orders for this visit:  Essential hypertension  Episodic lightheadedness   I have discontinued Ms. Zywicki's omeprazole, linaclotide, and amLODipine. I have also changed her amLODipine. Additionally, I am having her maintain her aspirin, Vitamin D3, Probiotic Product (PROBIOTIC DAILY PO), meloxicam, and atorvastatin.  Meds ordered this encounter  Medications  . amLODipine (NORVASC) 5 MG tablet    Sig: Take 0.5 tablets (2.5 mg total) by mouth daily.    Dispense:  90 tablet    Refill:  2    Order Specific Question:   Supervising Provider    Answer:   Cassandria Anger [[id number]]    Follow-up: Return if symptoms worsen or fail to improve.  Wilfred Lacy, NP

## 2017-01-16 ENCOUNTER — Other Ambulatory Visit: Payer: Self-pay | Admitting: Internal Medicine

## 2017-01-21 ENCOUNTER — Other Ambulatory Visit: Payer: Self-pay | Admitting: Internal Medicine

## 2017-01-21 NOTE — Telephone Encounter (Signed)
Pt called in and said that Dr Teena Irani told that if the omeprazole did not work, she need to go back on the [place] 60 MG capsule [[id number]] DISCONTINUED    She said that she want to go back on the [place]

## 2017-01-22 NOTE — Telephone Encounter (Signed)
Please advise. Thanks.  

## 2017-01-28 DIAGNOSIS — H401111 Primary open-angle glaucoma, right eye, mild stage: Secondary | ICD-10-CM | POA: Diagnosis not present

## 2017-01-28 LAB — HM DIABETES EYE EXAM

## 2017-02-18 LAB — HM DIABETES EYE EXAM

## 2017-02-19 ENCOUNTER — Encounter: Payer: Self-pay | Admitting: Internal Medicine

## 2017-02-19 ENCOUNTER — Ambulatory Visit (INDEPENDENT_AMBULATORY_CARE_PROVIDER_SITE_OTHER): Payer: Medicare HMO | Admitting: Internal Medicine

## 2017-02-19 DIAGNOSIS — K5909 Other constipation: Secondary | ICD-10-CM

## 2017-02-19 MED ORDER — LUBIPROSTONE 24 MCG PO CAPS: 24.0000 ug | ORAL_CAPSULE | Freq: Two times a day (BID) | ORAL | 3 refills | Status: DC

## 2017-02-19 NOTE — Patient Instructions (Signed)
We have sent in the amitiza for the bowels to take 1 pill twice a day. Give it 1 week to see if it will help.   Other options to try include magnesium citrate liquid over the counter.   You can also try senokot-d 1 pill twice a day to help with constipation to take all the time.   Constipation, Adult Constipation is when a person:  Poops (has a bowel movement) fewer times in a week than normal.  Has a hard time pooping.  Has poop that is dry, hard, or bigger than normal.  Follow these instructions at home: Eating and drinking   Eat foods that have a lot of fiber, such as: ? Fresh fruits and vegetables. ? Whole grains. ? Beans.  Eat less of foods that are high in fat, low in fiber, or overly processed, such as: ? [place] fries. ? Hamburgers. ? Cookies. ? Candy. ? Soda.  Drink enough fluid to keep your pee (urine) clear or pale yellow. General instructions  Exercise regularly or as told by your doctor.  Go to the restroom when you feel like you need to poop. Do not hold it in.  Take over-the-counter and prescription medicines only as told by your doctor. These include any fiber supplements.  Do pelvic floor retraining exercises, such as: ? Doing deep breathing while relaxing your lower belly (abdomen). ? Relaxing your pelvic floor while pooping.  Watch your condition for any changes.  Keep all follow-up visits as told by your doctor. This is important. Contact a doctor if:  You have pain that gets worse.  You have a fever.  You have not pooped for 4 days.  You throw up (vomit).  You are not hungry.  You lose weight.  You are bleeding from the anus.  You have thin, pencil-like poop (stool). Get help right away if:  You have a fever, and your symptoms suddenly get worse.  You leak poop or have blood in your poop.  Your belly feels hard or bigger than normal (is bloated).  You have very bad belly pain.  You feel dizzy or you faint. This  information is not intended to replace advice given to you by your health care provider. Make sure you discuss any questions you have with your health care provider. Document Released: 10/17/2007 Document Revised: 11/18/2015 Document Reviewed: 10/19/2015 Elsevier Interactive Patient Education  2017 [employer].

## 2017-02-19 NOTE — Assessment & Plan Note (Signed)
Rx for [facility]. No signs clinically of obstruction on exam today. Also advised she can use magnesium citrate as needed and should consider senokot-d BID for regular usage to help. Given information about fiber and water intake.

## 2017-02-19 NOTE — Progress Notes (Signed)
   Subjective:    Patient ID: Monica [name], female    DOB: 1940/05/31, 76 y.o.   MRN: [id number]  HPI The patient is a 76 YO female coming in for follow up of constipation. She has struggled with this for many years. She has seen GI and they tried her on linzess which she did not like. It worked at first but within the first month stopped working. It simply bloated her and did not increase bowel movements. She is using otc laxatives often. She is having marble stools which are difficult to pass. She has used miralax but this also gets her bloating without moving stool better. She took it twice daily and had frequent bowel movements but this was too much. She is passing gas and last BM yesterday but typically 3 per week.   Review of Systems  Constitutional: Negative.   Respiratory: Negative for cough, chest tightness and shortness of breath.   Cardiovascular: Negative for chest pain, palpitations and leg swelling.  Gastrointestinal: Positive for constipation. Negative for abdominal distention, abdominal pain, anal bleeding, blood in stool, diarrhea, nausea and vomiting.  Musculoskeletal: Negative.   Skin: Negative.       Objective:   Physical Exam  Constitutional: She is oriented to person, place, and time. She appears well-developed and well-nourished.  HENT:  Head: Normocephalic and atraumatic.  Eyes: EOM are normal.  Neck: Normal range of motion.  Cardiovascular: Normal rate and regular rhythm.   Pulmonary/Chest: Effort normal and breath sounds normal. No respiratory distress. She has no wheezes. She has no rales.  Abdominal: Soft. Bowel sounds are normal. She exhibits no distension. There is no tenderness. There is no rebound.  Musculoskeletal: She exhibits no edema.  Neurological: She is alert and oriented to person, place, and time. Coordination normal.  Skin: Skin is warm and dry.   Vitals:   02/19/17 1054  BP: 140/70  Pulse: 65  Temp: 98 F (36.7 C)  TempSrc: Oral  SpO2:  99%  Weight: 144 lb (65.3 kg)  Height: 5\' 6"  (1.676 m)      Assessment & Plan:

## 2017-03-28 DIAGNOSIS — Z79899 Other long term (current) drug therapy: Secondary | ICD-10-CM | POA: Diagnosis not present

## 2017-03-28 DIAGNOSIS — K219 Gastro-esophageal reflux disease without esophagitis: Secondary | ICD-10-CM | POA: Diagnosis not present

## 2017-03-28 DIAGNOSIS — K5900 Constipation, unspecified: Secondary | ICD-10-CM | POA: Diagnosis not present

## 2017-03-28 DIAGNOSIS — K5901 Slow transit constipation: Secondary | ICD-10-CM | POA: Diagnosis not present

## 2017-03-28 DIAGNOSIS — I10 Essential (primary) hypertension: Secondary | ICD-10-CM | POA: Diagnosis not present

## 2017-03-28 DIAGNOSIS — Z7982 Long term (current) use of aspirin: Secondary | ICD-10-CM | POA: Diagnosis not present

## 2017-03-28 DIAGNOSIS — R194 Change in bowel habit: Secondary | ICD-10-CM | POA: Diagnosis not present

## 2017-04-18 ENCOUNTER — Encounter: Payer: Self-pay | Admitting: Internal Medicine

## 2017-04-18 ENCOUNTER — Ambulatory Visit: Payer: Medicare HMO | Admitting: Internal Medicine

## 2017-04-18 VITALS — BP 140/82 | HR 66 | Temp 97.8°F | Ht 66.0 in | Wt 142.0 lb

## 2017-04-18 DIAGNOSIS — R319 Hematuria, unspecified: Secondary | ICD-10-CM

## 2017-04-18 LAB — POCT URINALYSIS DIPSTICK
Bilirubin, UA: NEGATIVE
Blood, UA: 25
GLUCOSE UA: NEGATIVE
Ketones, UA: NEGATIVE
Leukocytes, UA: NEGATIVE
NITRITE UA: NEGATIVE
Protein, UA: NEGATIVE
Spec Grav, UA: 1.01 (ref 1.010–1.025)
UROBILINOGEN UA: 0.2 U/dL
pH, UA: 6 (ref 5.0–8.0)

## 2017-04-18 NOTE — Assessment & Plan Note (Signed)
Referral to urology given ongoing nature of this problem. With review of records all samples going back to 2009 have had hematuria and she cannot recall workup in the past but thinks she saw urology.

## 2017-04-18 NOTE — Progress Notes (Signed)
   Subjective:    Patient ID: Monica [name], female    DOB: [birth year], 76 y.o.   MRN: [id number]  HPI The patient is a 76 YO female coming in for concerns about blood in her urine. She started noticing this after taking amitiza for constipation. She denies clots or gross blood with wiping. Denies pain with urination or back pain. She did stop taking the amitiza about several weeks ago. She has had intermittent microscopic hematuria with evaluation with urology many years ago without findings. She had CT abdomen and pelvis in 2015 without overt changes in urinary system. No history of kidney stones. Denies SOB or chest pains or lightheadedness.   Review of Systems  Constitutional: Negative.   Respiratory: Negative for cough, chest tightness and shortness of breath.   Cardiovascular: Negative for chest pain, palpitations and leg swelling.  Gastrointestinal: Negative for abdominal distention, abdominal pain, constipation, diarrhea, nausea and vomiting.  Genitourinary: Positive for hematuria. Negative for decreased urine volume, difficulty urinating, dyspareunia, dysuria, vaginal bleeding and vaginal discharge.  Musculoskeletal: Negative.   Skin: Negative.       Objective:   Physical Exam  Constitutional: She is oriented to person, place, and time. She appears well-developed and well-nourished.  HENT:  Head: Normocephalic and atraumatic.  Eyes: EOM are normal.  Neck: Normal range of motion.  Cardiovascular: Normal rate and regular rhythm.  Pulmonary/Chest: Effort normal and breath sounds normal. No respiratory distress. She has no wheezes. She has no rales.  Abdominal: Soft. Bowel sounds are normal. She exhibits no distension. There is no tenderness. There is no rebound.  Neurological: She is alert and oriented to person, place, and time. Coordination normal.  Skin: Skin is warm and dry.   Vitals:   04/18/17 0940  BP: 140/82  Pulse: 66  Temp: 97.8 F (36.6 C)  TempSrc: Oral  SpO2:  100%  Weight: 142 lb (64.4 kg)  Height: 5\' 6"  (1.676 m)      Assessment & Plan:

## 2017-04-18 NOTE — Patient Instructions (Signed)
We will have you take 4-6 oz of magnesium citrate daily for the bowels.   We will get you in with the urologist to check on the blood in the urine.

## 2017-05-29 DIAGNOSIS — R310 Gross hematuria: Secondary | ICD-10-CM | POA: Diagnosis not present

## 2017-06-07 DIAGNOSIS — R310 Gross hematuria: Secondary | ICD-10-CM | POA: Diagnosis not present

## 2017-06-07 DIAGNOSIS — D259 Leiomyoma of uterus, unspecified: Secondary | ICD-10-CM | POA: Diagnosis not present

## 2017-06-21 DIAGNOSIS — R310 Gross hematuria: Secondary | ICD-10-CM | POA: Diagnosis not present

## 2017-06-24 DIAGNOSIS — Z1231 Encounter for screening mammogram for malignant neoplasm of breast: Secondary | ICD-10-CM | POA: Diagnosis not present

## 2017-06-24 DIAGNOSIS — Z124 Encounter for screening for malignant neoplasm of cervix: Secondary | ICD-10-CM | POA: Diagnosis not present

## 2017-06-24 DIAGNOSIS — Z01419 Encounter for gynecological examination (general) (routine) without abnormal findings: Secondary | ICD-10-CM | POA: Diagnosis not present

## 2017-06-24 DIAGNOSIS — R87610 Atypical squamous cells of undetermined significance on cytologic smear of cervix (ASC-US): Secondary | ICD-10-CM | POA: Diagnosis not present

## 2017-06-24 DIAGNOSIS — D259 Leiomyoma of uterus, unspecified: Secondary | ICD-10-CM | POA: Diagnosis not present

## 2017-07-25 DIAGNOSIS — H401111 Primary open-angle glaucoma, right eye, mild stage: Secondary | ICD-10-CM | POA: Diagnosis not present

## 2017-08-06 ENCOUNTER — Other Ambulatory Visit: Payer: Self-pay | Admitting: Internal Medicine

## 2017-08-12 ENCOUNTER — Ambulatory Visit (INDEPENDENT_AMBULATORY_CARE_PROVIDER_SITE_OTHER): Payer: Medicare HMO | Admitting: *Deleted

## 2017-08-12 VITALS — BP 132/74 | HR 74 | Resp 18 | Ht 66.0 in | Wt 144.0 lb

## 2017-08-12 DIAGNOSIS — Z0000 Encounter for general adult medical examination without abnormal findings: Secondary | ICD-10-CM | POA: Diagnosis not present

## 2017-08-12 DIAGNOSIS — E119 Type 2 diabetes mellitus without complications: Secondary | ICD-10-CM

## 2017-08-12 NOTE — Patient Instructions (Addendum)
Continue doing brain stimulating activities (puzzles, reading, adult coloring books, staying active) to keep memory sharp.   Continue to eat heart healthy diet (full of fruits, vegetables, whole grains, lean protein, water--limit salt, fat, and sugar intake) and increase physical activity as tolerated.   Monica [name] , Thank you for taking time to come for your Medicare Wellness Visit. I appreciate your ongoing commitment to your health goals. Please review the following plan we discussed and let me know if I can assist you in the future.   These are the goals we discussed: Goals    . Patient Stated     Continue to reach out others and help all that I can. Continue to eat healthy, exercise, enjoy life and worship God.       This is a list of the screening recommended for you and due dates:  Health Maintenance  Topic Date Due  . Tetanus Vaccine  07/14/1961  . Pneumonia vaccines (1 of 2 - PCV13) 07/15/2007  . Complete foot exam   12/13/2016  . Hemoglobin A1C  12/16/2016  . Urine Protein Check  06/18/2017  . Flu Shot  02/12/2018*  . Eye exam for diabetics  02/18/2018  . Colon Cancer Screening  09/22/2018  . DEXA scan (bone density measurement)  Completed  *Topic was postponed. The date shown is not the original due date.     www.auntbertha.com or down load app on smart phone [employer] website lists multiple social resources for individuals such as: food, health, money, house hold goods, transit, medical supplies, job training and legal services.  Back Exercises If you have pain in your back, do these exercises 2-3 times each day or as told by your doctor. When the pain goes away, do the exercises once each day, but repeat the steps more times for each exercise (do more repetitions). If you do not have pain in your back, do these exercises once each day or as told by your doctor. Exercises Single Knee to Chest  Do these steps 3-5 times in a row for each leg: 1. Lie on your back on  a firm bed or the floor with your legs stretched out. 2. Bring one knee to your chest. 3. Hold your knee to your chest by grabbing your knee or thigh. 4. Pull on your knee until you feel a gentle stretch in your lower back. 5. Keep doing the stretch for 10-30 seconds. 6. Slowly let go of your leg and straighten it.  Pelvic Tilt  Do these steps 5-10 times in a row: 1. Lie on your back on a firm bed or the floor with your legs stretched out. 2. Bend your knees so they point up to the ceiling. Your feet should be flat on the floor. 3. Tighten your lower belly (abdomen) muscles to press your lower back against the floor. This will make your tailbone point up to the ceiling instead of pointing down to your feet or the floor. 4. Stay in this position for 5-10 seconds while you gently tighten your muscles and breathe evenly.  Cat-Cow  Do these steps until your lower back bends more easily: 1. Get on your hands and knees on a firm surface. Keep your hands under your shoulders, and keep your knees under your hips. You may put padding under your knees. 2. Let your head hang down, and make your tailbone point down to the floor so your lower back is round like the back of a cat. 3. Stay  in this position for 5 seconds. 4. Slowly lift your head and make your tailbone point up to the ceiling so your back hangs low (sags) like the back of a cow. 5. Stay in this position for 5 seconds.  Press-Ups  Do these steps 5-10 times in a row: 1. Lie on your belly (face-down) on the floor. 2. Place your hands near your head, about shoulder-width apart. 3. While you keep your back relaxed and keep your hips on the floor, slowly straighten your arms to raise the top half of your body and lift your shoulders. Do not use your back muscles. To make yourself more comfortable, you may change where you place your hands. 4. Stay in this position for 5 seconds. 5. Slowly return to lying flat on the floor.  Bridges  Do  these steps 10 times in a row: 1. Lie on your back on a firm surface. 2. Bend your knees so they point up to the ceiling. Your feet should be flat on the floor. 3. Tighten your butt muscles and lift your butt off of the floor until your waist is almost as high as your knees. If you do not feel the muscles working in your butt and the back of your thighs, slide your feet 1-2 inches farther away from your butt. 4. Stay in this position for 3-5 seconds. 5. Slowly lower your butt to the floor, and let your butt muscles relax.  If this exercise is too easy, try doing it with your arms crossed over your chest. Belly Crunches  Do these steps 5-10 times in a row: 1. Lie on your back on a firm bed or the floor with your legs stretched out. 2. Bend your knees so they point up to the ceiling. Your feet should be flat on the floor. 3. Cross your arms over your chest. 4. Tip your chin a little bit toward your chest but do not bend your neck. 5. Tighten your belly muscles and slowly raise your chest just enough to lift your shoulder blades a tiny bit off of the floor. 6. Slowly lower your chest and your head to the floor.  Back Lifts Do these steps 5-10 times in a row: 1. Lie on your belly (face-down) with your arms at your sides, and rest your forehead on the floor. 2. Tighten the muscles in your legs and your butt. 3. Slowly lift your chest off of the floor while you keep your hips on the floor. Keep the back of your head in line with the curve in your back. Look at the floor while you do this. 4. Stay in this position for 3-5 seconds. 5. Slowly lower your chest and your face to the floor.  Contact a doctor if:  Your back pain gets a lot worse when you do an exercise.  Your back pain does not lessen 2 hours after you exercise. If you have any of these problems, stop doing the exercises. Do not do them again unless your doctor says it is okay. Get help right away if:  You have sudden, very bad  back pain. If this happens, stop doing the exercises. Do not do them again unless your doctor says it is okay. This information is not intended to replace advice given to you by your health care provider. Make sure you discuss any questions you have with your health care provider. Document Released: 06/02/2010 Document Revised: 10/06/2015 Document Reviewed: 06/24/2014 Elsevier Interactive Patient Education  [employer].  Neck Exercises Neck exercises can be important for many reasons:  They can help you to improve and maintain flexibility in your neck. This can be especially important as you age.  They can help to make your neck stronger. This can make movement easier.  They can reduce or prevent neck pain.  They may help your upper back.  Ask your health care provider which neck exercises would be best for you. Exercises Neck Press Repeat this exercise 10 times. Do it first thing in the morning and right before bed or as told by your health care provider. 1. Lie on your back on a firm bed or on the floor with a pillow under your head. 2. Use your neck muscles to push your head down on the pillow and straighten your spine. 3. Hold the position as well as you can. Keep your head facing up and your chin tucked. 4. Slowly count to 5 while holding this position. 5. Relax for a few seconds. Then repeat.  Isometric Strengthening Do a full set of these exercises 2 times a day or as told by your health care provider. 1. Sit in a supportive chair and place your hand on your forehead. 2. Push forward with your head and neck while pushing back with your hand. Hold for 10 seconds. 3. Relax. Then repeat the exercise 3 times. 4. Next, do thesequence again, this time putting your hand against the back of your head. Use your head and neck to push backward against the hand pressure. 5. Finally, do the same exercise on either side of your head, pushing sideways against the pressure of your  hand.  Prone Head Lifts Repeat this exercise 5 times. Do this 2 times a day or as told by your health care provider. 1. Lie face-down, resting on your elbows so that your chest and upper back are raised. 2. Start with your head facing downward, near your chest. Position your chin either on or near your chest. 3. Slowly lift your head upward. Lift until you are looking straight ahead. Then continue lifting your head as far back as you can stretch. 4. Hold your head up for 5 seconds. Then slowly lower it to your starting position.  Supine Head Lifts Repeat this exercise 8-10 times. Do this 2 times a day or as told by your health care provider. 1. Lie on your back, bending your knees to point to the ceiling and keeping your feet flat on the floor. 2. Lift your head slowly off the floor, raising your chin toward your chest. 3. Hold for 5 seconds. 4. Relax and repeat.  Scapular Retraction Repeat this exercise 5 times. Do this 2 times a day or as told by your health care provider. 1. Stand with your arms at your sides. Look straight ahead. 2. Slowly pull both shoulders backward and downward until you feel a stretch between your shoulder blades in your upper back. 3. Hold for 10-30 seconds. 4. Relax and repeat.  Contact a health care provider if:  Your neck pain or discomfort gets much worse when you do an exercise.  Your neck pain or discomfort does not improve within 2 hours after you exercise. If you have any of these problems, stop exercising right away. Do not do the exercises again unless your health care provider says that you can. Get help right away if:  You develop sudden, severe neck pain. If this happens, stop exercising right away. Do not do the exercises again unless your  health care provider says that you can. Exercises Neck Stretch  Repeat this exercise 3-5 times. 1. Do this exercise while standing or while sitting in a chair. 2. Place your feet flat on the floor,  shoulder-width apart. 3. Slowly turn your head to the right. Turn it all the way to the right so you can look over your right shoulder. Do not tilt or tip your head. 4. Hold this position for 10-30 seconds. 5. Slowly turn your head to the left, to look over your left shoulder. 6. Hold this position for 10-30 seconds.  Neck Retraction Repeat this exercise 8-10 times. Do this 3-4 times a day or as told by your health care provider. 1. Do this exercise while standing or while sitting in a sturdy chair. 2. Look straight ahead. Do not bend your neck. 3. Use your fingers to push your chin backward. Do not bend your neck for this movement. Continue to face straight ahead. If you are doing the exercise properly, you will feel a slight sensation in your throat and a stretch at the back of your neck. 4. Hold the stretch for 1-2 seconds. Relax and repeat.  This information is not intended to replace advice given to you by your health care provider. Make sure you discuss any questions you have with your health care provider. Document Released: 04/11/2015 Document Revised: 10/06/2015 Document Reviewed: 11/08/2014 Elsevier Interactive Patient Education  [employer].

## 2017-08-12 NOTE — Progress Notes (Signed)
Subjective:   Monica [name] is a 77 y.o. female who presents for Medicare Annual (Subsequent) preventive examination.  Review of Systems:  No ROS.  Medicare Wellness Visit. Additional risk factors are reflected in the social history.  Cardiac Risk Factors include: advanced age (>56men, >29 women);diabetes mellitus;dyslipidemia;hypertension Sleep patterns: feels rested on waking, gets up 1 times nightly to void and sleeps 7 hours nightly.    Home Safety/Smoke Alarms: Feels safe in home. Smoke alarms in place.  Living environment; residence and Firearm Safety: 1-story house/ trailer, no firearms. Lives alone, no needs for DME, good support system Seat Belt Safety/Bike Helmet: Wears seat belt.     Objective:     Vitals: BP 132/74   Pulse 74   Resp 18   Ht 5\' 6"  (1.676 m)   Wt 144 lb (65.3 kg)   SpO2 98%   BMI 23.24 kg/m   Body mass index is 23.24 kg/m.  Advanced Directives 08/12/2017 10/31/2016 04/30/2016 04/28/2015 10/28/2014 07/29/2014 05/10/2014  Does Patient Have a Medical Advance Directive? No No No No No No No  Would patient like information on creating a medical advance directive? Yes (ED - Information included in AVS) No - Patient declined No - Patient declined No - patient declined information No - patient declined information No - patient declined information No - patient declined information    Tobacco Social History   Tobacco Use  Smoking Status Never Smoker  Smokeless Tobacco Never Used     Counseling given: Not Answered   Past Medical History:  Diagnosis Date  . ANXIETY   . Arthritis   . Chronic constipation   . 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 DEPRESSION, SITUATIONAL   . Diabetes mellitus ([place]) dx 02/2013  . GERD (gastroesophageal reflux disease)    w/ HH (EGD 2010)  . GLAUCOMA   . Hypertension   . Iron deficiency anemia due to chronic blood loss 02/05/2014  . LOW BACK PAIN, CHRONIC   . Mixed hyperlipidemia   . OSTEOPENIA   .  Stricture and stenosis of esophagus 2006   Past Surgical History:  Procedure Laterality Date  . BACK SURGERY    . ESOPHAGEAL MANOMETRY N/A 02/09/2013   Procedure: ESOPHAGEAL MANOMETRY (EM);  Surgeon: Sable Feil, MD;  Location: [facility];  Service: Endoscopy;  Laterality: N/A;  . EYE SURGERY    . HEMORROIDECTOMY    . HYSTEROSCOPY W/D&C  07/20/2011   Procedure: DILATATION AND CURETTAGE /HYSTEROSCOPY;  Surgeon: Melina Schools, MD;  Location: [facility];  Service: Gynecology;  Laterality: N/A;  . POLYPECTOMY  07/20/2011   Procedure: POLYPECTOMY;  Surgeon: Melina Schools, MD;  Location: [facility];  Service: Gynecology;  Laterality: N/A;  . ROTATOR CUFF REPAIR     right   . TUBAL LIGATION     Family History  Problem Relation Age of Onset  . Heart failure Father   . Diabetes Mother   . Breast cancer Sister   . Colon cancer Sister 46  . Diabetes Brother    Social History   Socioeconomic History  . Marital status: Divorced    Spouse name: Not on file  . Number of children: 5  . Years of education: Not on file  . Highest education level: Not on file  Occupational History  . Occupation: retired  Scientific laboratory technician  . Financial resource strain: Somewhat hard  . Food insecurity:    Worry: Sometimes true    Inability: Sometimes true  .  Transportation needs:    Medical: No    Non-medical: No  Tobacco Use  . Smoking status: Never Smoker  . Smokeless tobacco: Never Used  Substance and Sexual Activity  . Alcohol use: No    Alcohol/week: 0.0 oz  . Drug use: No  . Sexual activity: Never  Lifestyle  . Physical activity:    Days per week: 4 days    Minutes per session: 40 min  . Stress: Not at all  Relationships  . Social connections:    Talks on phone: More than three times a week    Gets together: More than three times a week    Attends religious service: More than 4 times per year    Active member of club or organization: Yes    Attends meetings of clubs or organizations: More  than 4 times per year    Relationship status: Divorced  Other Topics Concern  . Not on file  Social History Narrative  . Not on file    Outpatient Encounter Medications as of 08/12/2017  Medication Sig  . amLODipine (NORVASC) 5 MG tablet Take 0.5 tablets (2.5 mg total) by mouth daily.  [name] aspirin 81 MG tablet Take 81 mg by mouth daily.    . Cholecalciferol (VITAMIN D3) 1000 UNITS CAPS Take 1,000 Units by mouth daily.  [name] dexlansoprazole (DEXILANT) 60 MG capsule Take 1 capsule (60 mg total) by mouth daily. Need annual appointment for further refills  . meloxicam (MOBIC) 7.5 MG tablet Take 1 tablet (7.5 mg total) by mouth daily. Need annual appointment for further refills  . Probiotic Product (PROBIOTIC DAILY PO) Take 1 capsule by mouth daily.  . [DISCONTINUED] atorvastatin (LIPITOR) 10 MG tablet TAKE ONE TABLET BY MOUTH ONCE DAILY (Patient not taking: Reported on 08/12/2017)  . [DISCONTINUED] lubiprostone (AMITIZA) 24 MCG capsule Take 1 capsule (24 mcg total) by mouth 2 (two) times daily with a meal. (Patient not taking: Reported on 08/12/2017)   No facility-administered encounter medications on file as of 08/12/2017.     Activities of Daily Living In your present state of health, do you have any difficulty performing the following activities: 08/12/2017  Hearing? N  Vision? N  Difficulty concentrating or making decisions? N  Walking or climbing stairs? N  Dressing or bathing? N  Doing errands, shopping? N  Preparing Food and eating ? N  Using the Toilet? N  In the past six months, have you accidently leaked urine? N  Do you have problems with loss of bowel control? N  Managing your Medications? N  Managing your Finances? N  Housekeeping or managing your Housekeeping? N  Some recent data might be hidden    Patient Care Team: [name], MD as PCP - General (Internal Medicine) Newton Pigg, MD (Obstetrics and Gynecology) Katy Apo, MD (Ophthalmology) Inda Castle,  MD (Inactive) (Gastroenterology) Sable Feil, MD as Consulting Physician (Gastroenterology)    Assessment:   This is a routine wellness examination for [place]. Physical assessment deferred to PCP.   Exercise Activities and Dietary recommendations Current Exercise Habits: Structured exercise class;Home exercise routine, Type of exercise: walking, Time (Minutes): 40, Frequency (Times/Week): 3, Weekly Exercise (Minutes/Week): 120, Exercise limited by: orthopedic condition(s)  Diet (meal preparation, eat out, water intake, caffeinated beverages, dairy products, fruits and vegetables): in general, a "healthy" diet  , well balanced, eats a variety of fruits and vegetables daily, limits salt, fat/cholesterol, sugar,carbohydrates,caffeine, drinks 6-8 glasses of water daily.  Goals    .  Patient Stated     Continue to reach out others and help all that I can. Continue to eat healthy, exercise, enjoy life and worship God.       Fall Risk Fall Risk  08/12/2017 04/19/2016 04/11/2015 06/02/2014 07/17/2013  Falls in the past year? No No Yes Yes Yes  Number falls in past yr: - - 1 1 1   Injury with Fall? - - No No -  Risk for fall due to : - - - Other (Comment) -   Depression Screen PHQ 2/9 Scores 08/12/2017 04/19/2016 04/11/2015 07/17/2013  PHQ - 2 Score 1 0 0 0  PHQ- 9 Score 1 - - -     Cognitive Function MMSE - Mini Mental State Exam 08/12/2017  Orientation to time 5  Orientation to Place 5  Registration 3  Attention/ Calculation 4  Recall 2  Language- name 2 objects 2  Language- repeat 1  Language- follow 3 step command 3  Language- read & follow direction 1  Write a sentence 1  Copy design 1  Total score 28        There is no immunization history for the selected administration types on file for this patient.  Screening Tests Health Maintenance  Topic Date Due  . TETANUS/TDAP  07/14/1961  . FOOT EXAM  12/13/2016  . HEMOGLOBIN A1C  12/16/2016  . URINE MICROALBUMIN  06/18/2017    . INFLUENZA VACCINE  02/12/2018 (Originally 12/12/2017)  . PNA vac Low Risk Adult (1 of 2 - PCV13) 08/13/2018 (Originally 07/15/2007)  . OPHTHALMOLOGY EXAM  02/18/2018  . COLONOSCOPY  09/22/2018  . DEXA SCAN  Completed      Plan:    Continue doing brain stimulating activities (puzzles, reading, adult coloring books, staying active) to keep memory sharp.   Continue to eat heart healthy diet (full of fruits, vegetables, whole grains, lean protein, water--limit salt, fat, and sugar intake) and increase physical activity as tolerated.   I have personally reviewed and noted the following in the patient's chart:   . Medical and social history . Use of alcohol, tobacco or illicit drugs  . Current medications and supplements . Functional ability and status . Nutritional status . Physical activity . Advanced directives . List of other physicians . Vitals . Screenings to include cognitive, depression, and falls . Referrals and appointments  In addition, I have reviewed and discussed with patient certain preventive protocols, quality metrics, and best practice recommendations. A written personalized care plan for preventive services as well as general preventive health recommendations were provided to patient.     Michiel Cowboy, RN  08/12/2017

## 2017-08-13 NOTE — Progress Notes (Signed)
Medical screening examination/treatment/procedure(s) were performed by non-physician practitioner and as supervising physician I was immediately available for consultation/collaboration. I agree with above. Pinchos Topel A Mehtaab Mayeda, MD 

## 2017-10-04 ENCOUNTER — Other Ambulatory Visit: Payer: Self-pay | Admitting: Internal Medicine

## 2017-11-07 ENCOUNTER — Other Ambulatory Visit (INDEPENDENT_AMBULATORY_CARE_PROVIDER_SITE_OTHER): Payer: Medicare HMO

## 2017-11-07 ENCOUNTER — Ambulatory Visit (INDEPENDENT_AMBULATORY_CARE_PROVIDER_SITE_OTHER): Payer: Medicare HMO | Admitting: Internal Medicine

## 2017-11-07 ENCOUNTER — Encounter: Payer: Self-pay | Admitting: Internal Medicine

## 2017-11-07 VITALS — BP 126/80 | HR 61 | Temp 97.9°F | Ht 66.0 in | Wt 143.0 lb

## 2017-11-07 DIAGNOSIS — Z0000 Encounter for general adult medical examination without abnormal findings: Secondary | ICD-10-CM | POA: Diagnosis not present

## 2017-11-07 DIAGNOSIS — E119 Type 2 diabetes mellitus without complications: Secondary | ICD-10-CM

## 2017-11-07 DIAGNOSIS — E782 Mixed hyperlipidemia: Secondary | ICD-10-CM | POA: Diagnosis not present

## 2017-11-07 DIAGNOSIS — I10 Essential (primary) hypertension: Secondary | ICD-10-CM | POA: Diagnosis not present

## 2017-11-07 DIAGNOSIS — K2100 Gastro-esophageal reflux disease with esophagitis, without bleeding: Secondary | ICD-10-CM

## 2017-11-07 LAB — LIPID PANEL
CHOL/HDL RATIO: 4
Cholesterol: 207 mg/dL — ABNORMAL HIGH (ref 0–200)
HDL: 57.7 mg/dL (ref >39.00)
LDL CALC: 132 mg/dL — AB (ref 0–99)
NONHDL: 148.9
Triglycerides: 85 mg/dL (ref 0.0–149.0)
VLDL: 17 mg/dL (ref 0.0–40.0)

## 2017-11-07 LAB — CBC
HCT: 39 % (ref 36.0–46.0)
HEMOGLOBIN: 12.9 g/dL (ref 12.0–15.0)
MCHC: 33.1 g/dL (ref 30.0–36.0)
MCV: 92.3 fl (ref 78.0–100.0)
Platelets: 226 10*3/uL (ref 150.0–400.0)
RBC: 4.22 Mil/uL (ref 3.87–5.11)
RDW: 14.4 % (ref 11.5–15.5)
WBC: 3.6 10*3/uL — ABNORMAL LOW (ref 4.0–10.5)

## 2017-11-07 LAB — MICROALBUMIN / CREATININE URINE RATIO
Creatinine,U: 117 mg/dL
MICROALB UR: 4.6 mg/dL — AB (ref 0.0–1.9)
MICROALB/CREAT RATIO: 3.9 mg/g (ref 0.0–30.0)

## 2017-11-07 LAB — COMPREHENSIVE METABOLIC PANEL
ALT: 11 U/L (ref 0–35)
AST: 15 U/L (ref 0–37)
Albumin: 4.4 g/dL (ref 3.5–5.2)
Alkaline Phosphatase: 51 U/L (ref 39–117)
BUN: 14 mg/dL (ref 6–23)
CHLORIDE: 105 meq/L (ref 96–112)
CO2: 31 mEq/L (ref 19–32)
Calcium: 9.8 mg/dL (ref 8.4–10.5)
Creatinine, Ser: 0.88 mg/dL (ref 0.40–1.20)
GFR: 80.49 mL/min (ref >60.00)
GLUCOSE: 110 mg/dL — AB (ref 70–99)
POTASSIUM: 4.4 meq/L (ref 3.5–5.1)
Sodium: 141 mEq/L (ref 135–145)
TOTAL PROTEIN: 7.6 g/dL (ref 6.0–8.3)
Total Bilirubin: 0.8 mg/dL (ref 0.2–1.2)

## 2017-11-07 LAB — HEMOGLOBIN A1C: HEMOGLOBIN A1C: 6.7 % — AB (ref 4.6–6.5)

## 2017-11-07 LAB — VITAMIN D 25 HYDROXY (VIT D DEFICIENCY, FRACTURES): VITD: 43 ng/mL (ref 30.00–100.00)

## 2017-11-07 LAB — TSH: TSH: 2.33 u[IU]/mL (ref 0.35–4.50)

## 2017-11-07 MED ORDER — AMLODIPINE BESYLATE 5 MG PO TABS: 2.5000 mg | ORAL_TABLET | Freq: Every day | ORAL | 3 refills | Status: DC

## 2017-11-07 MED ORDER — DEXLANSOPRAZOLE 60 MG PO CPDR: 1.0000 | DELAYED_RELEASE_CAPSULE | Freq: Every day | ORAL | 3 refills | Status: DC

## 2017-11-07 MED ORDER — MELOXICAM 7.5 MG PO TABS: 7.5000 mg | ORAL_TABLET | Freq: Every day | ORAL | 3 refills | Status: DC

## 2017-11-07 MED ORDER — AMLODIPINE BESYLATE 5 MG PO TABS: 5.0000 mg | ORAL_TABLET | Freq: Every day | ORAL | 3 refills | Status: DC

## 2017-11-07 NOTE — Progress Notes (Signed)
   Subjective:    Patient ID: Monica [name], female    DOB: [birth year], 77 y.o.   MRN: [id number]  HPI The patient is a 77 YO female coming in for physical. No new concerns.   PMH, [facility], social history reviewed and updated.   Review of Systems  Constitutional: Negative.   HENT: Negative.   Eyes: Negative.   Respiratory: Negative for cough, chest tightness and shortness of breath.   Cardiovascular: Negative for chest pain, palpitations and leg swelling.  Gastrointestinal: Negative for abdominal distention, abdominal pain, constipation, diarrhea, nausea and vomiting.  Musculoskeletal: Negative.   Skin: Negative.   Neurological: Negative.   Psychiatric/Behavioral: Negative.       Objective:   Physical Exam  Constitutional: She is oriented to person, place, and time. She appears well-developed and well-nourished.  HENT:  Head: Normocephalic and atraumatic.  Eyes: EOM are normal.  Neck: Normal range of motion.  Cardiovascular: Normal rate and regular rhythm.  Pulmonary/Chest: Effort normal and breath sounds normal. No respiratory distress. She has no wheezes. She has no rales.  Abdominal: Soft. Bowel sounds are normal. She exhibits no distension. There is no tenderness. There is no rebound.  Musculoskeletal: She exhibits no edema.  Neurological: She is alert and oriented to person, place, and time. Coordination normal.  Skin: Skin is warm and dry.  Psychiatric: She has a normal mood and affect.   Vitals:   11/07/17 0835  BP: 126/80  Pulse: 61  Temp: 97.9 F (36.6 C)  TempSrc: Oral  SpO2: 99%  Weight: 143 lb (64.9 kg)  Height: 5\' 6"  (1.676 m)   EKG: Rate 63, sinus, axis and intervals normal, no st or t wave changes, no significant change since 2013.    Assessment & Plan:

## 2017-11-07 NOTE — Patient Instructions (Addendum)
Your EKG looks normal today. We will check the labs and call you back about the results.   Health Maintenance, Female Adopting a healthy lifestyle and getting preventive care can go a long way to promote health and wellness. Talk with your health care provider about what schedule of regular examinations is right for you. This is a good chance for you to check in with your provider about disease prevention and staying healthy. In between checkups, there are plenty of things you can do on your own. Experts have done a lot of research about which lifestyle changes and preventive measures are most likely to keep you healthy. Ask your health care provider for more information. Weight and diet Eat a healthy diet  Be sure to include plenty of vegetables, fruits, low-fat dairy products, and lean protein.  Do not eat a lot of foods high in solid fats, added sugars, or salt.  Get regular exercise. This is one of the most important things you can do for your health. ? Most adults should exercise for at least 150 minutes each week. The exercise should increase your heart rate and make you sweat (moderate-intensity exercise). ? Most adults should also do strengthening exercises at least twice a week. This is in addition to the moderate-intensity exercise.  Maintain a healthy weight  Body mass index (BMI) is a measurement that can be used to identify possible weight problems. It estimates body fat based on height and weight. Your health care provider can help determine your BMI and help you achieve or maintain a healthy weight.  For females 64 years of age and older: ? A BMI below 18.5 is considered underweight. ? A BMI of 18.5 to 24.9 is normal. ? A BMI of 25 to 29.9 is considered overweight. ? A BMI of 30 and above is considered obese.  Watch levels of cholesterol and blood lipids  You should start having your blood tested for lipids and cholesterol at 75 years of age, then have this test every 5  years.  You may need to have your cholesterol levels checked more often if: ? Your lipid or cholesterol levels are high. ? You are older than 75 years of age. ? You are at high risk for heart disease.  Cancer screening Lung Cancer  Lung cancer screening is recommended for adults 23-13 years old who are at high risk for lung cancer because of a history of smoking.  A yearly low-dose CT scan of the lungs is recommended for people who: ? Currently smoke. ? Have quit within the past 15 years. ? Have at least a 30-pack-year history of smoking. A pack year is smoking an average of one pack of cigarettes a day for 1 year.  Yearly screening should continue until it has been 15 years since you quit.  Yearly screening should stop if you develop a health problem that would prevent you from having lung cancer treatment.  Breast Cancer  Practice breast self-awareness. This means understanding how your breasts normally appear and feel.  It also means doing regular breast self-exams. Let your health care provider know about any changes, no matter how small.  If you are in your 20s or 30s, you should have a clinical breast exam (CBE) by a health care provider every 1-3 years as part of a regular health exam.  If you are 30 or older, have a CBE every year. Also consider having a breast X-ray (mammogram) every year.  If you have a family history  of breast cancer, talk to your health care provider about genetic screening.  If you are at high risk for breast cancer, talk to your health care provider about having an MRI and a mammogram every year.  Breast cancer gene (BRCA) assessment is recommended for women who have family members with BRCA-related cancers. BRCA-related cancers include: ? Breast. ? Ovarian. ? Tubal. ? Peritoneal cancers.  Results of the assessment will determine the need for genetic counseling and BRCA1 and BRCA2 testing.  Cervical Cancer Your health care provider may  recommend that you be screened regularly for cancer of the pelvic organs (ovaries, uterus, and vagina). This screening involves a pelvic examination, including checking for microscopic changes to the surface of your cervix (Pap test). You may be encouraged to have this screening done every 3 years, beginning at age 21.  For women ages 30-65, health care providers may recommend pelvic exams and Pap testing every 3 years, or they may recommend the Pap and pelvic exam, combined with testing for human papilloma virus (HPV), every 5 years. Some types of HPV increase your risk of cervical cancer. Testing for HPV may also be done on women of any age with unclear Pap test results.  Other health care providers may not recommend any screening for nonpregnant women who are considered low risk for pelvic cancer and who do not have symptoms. Ask your health care provider if a screening pelvic exam is right for you.  If you have had past treatment for cervical cancer or a condition that could lead to cancer, you need Pap tests and screening for cancer for at least 20 years after your treatment. If Pap tests have been discontinued, your risk factors (such as having a new sexual partner) need to be reassessed to determine if screening should resume. Some women have medical problems that increase the chance of getting cervical cancer. In these cases, your health care provider may recommend more frequent screening and Pap tests.  Colorectal Cancer  This type of cancer can be detected and often prevented.  Routine colorectal cancer screening usually begins at 75 years of age and continues through 75 years of age.  Your health care provider may recommend screening at an earlier age if you have risk factors for colon cancer.  Your health care provider may also recommend using home test kits to check for hidden blood in the stool.  A small camera at the end of a tube can be used to examine your colon directly  (sigmoidoscopy or colonoscopy). This is done to check for the earliest forms of colorectal cancer.  Routine screening usually begins at age 50.  Direct examination of the colon should be repeated every 5-10 years through 75 years of age. However, you may need to be screened more often if early forms of precancerous polyps or small growths are found.  Skin Cancer  Check your skin from head to toe regularly.  Tell your health care provider about any new moles or changes in moles, especially if there is a change in a mole's shape or color.  Also tell your health care provider if you have a mole that is larger than the size of a pencil eraser.  Always use sunscreen. Apply sunscreen liberally and repeatedly throughout the day.  Protect yourself by wearing long sleeves, pants, a wide-brimmed hat, and sunglasses whenever you are outside.  Heart disease, diabetes, and high blood pressure  High blood pressure causes heart disease and increases the risk of stroke. High   blood pressure is more likely to develop in: ? People who have blood pressure in the high end of the normal range (130-139/85-89 mm Hg). ? People who are overweight or obese. ? People who are African American.  If you are 18-39 years of age, have your blood pressure checked every 3-5 years. If you are 40 years of age or older, have your blood pressure checked every year. You should have your blood pressure measured twice-once when you are at a hospital or clinic, and once when you are not at a hospital or clinic. Record the average of the two measurements. To check your blood pressure when you are not at a hospital or clinic, you can use: ? An automated blood pressure machine at a pharmacy. ? A home blood pressure monitor.  If you are between 55 years and 79 years old, ask your health care provider if you should take aspirin to prevent strokes.  Have regular diabetes screenings. This involves taking a blood sample to check your  fasting blood sugar level. ? If you are at a normal weight and have a low risk for diabetes, have this test once every three years after 75 years of age. ? If you are overweight and have a high risk for diabetes, consider being tested at a younger age or more often. Preventing infection Hepatitis B  If you have a higher risk for hepatitis B, you should be screened for this virus. You are considered at high risk for hepatitis B if: ? You were born in a country where hepatitis B is common. Ask your health care provider which countries are considered high risk. ? Your parents were born in a high-risk country, and you have not been immunized against hepatitis B (hepatitis B vaccine). ? You have HIV or AIDS. ? You use needles to inject street drugs. ? You live with someone who has hepatitis B. ? You have had sex with someone who has hepatitis B. ? You get hemodialysis treatment. ? You take certain medicines for conditions, including cancer, organ transplantation, and autoimmune conditions.  Hepatitis C  Blood testing is recommended for: ? Everyone born from 1945 through 1965. ? Anyone with known risk factors for hepatitis C.  Sexually transmitted infections (STIs)  You should be screened for sexually transmitted infections (STIs) including gonorrhea and chlamydia if: ? You are sexually active and are younger than 75 years of age. ? You are older than 75 years of age and your health care provider tells you that you are at risk for this type of infection. ? Your sexual activity has changed since you were last screened and you are at an increased risk for chlamydia or gonorrhea. Ask your health care provider if you are at risk.  If you do not have HIV, but are at risk, it may be recommended that you take a prescription medicine daily to prevent HIV infection. This is called pre-exposure prophylaxis (PrEP). You are considered at risk if: ? You are sexually active and do not regularly use condoms  or know the HIV status of your partner(s). ? You take drugs by injection. ? You are sexually active with a partner who has HIV.  Talk with your health care provider about whether you are at high risk of being infected with HIV. If you choose to begin PrEP, you should first be tested for HIV. You should then be tested every 3 months for as long as you are taking PrEP. Pregnancy  If you are   premenopausal and you may become pregnant, ask your health care provider about preconception counseling.  If you may become pregnant, take 400 to 800 micrograms (mcg) of folic acid every day.  If you want to prevent pregnancy, talk to your health care provider about birth control (contraception). Osteoporosis and menopause  Osteoporosis is a disease in which the bones lose minerals and strength with aging. This can result in serious bone fractures. Your risk for osteoporosis can be identified using a bone density scan.  If you are 41 years of age or older, or if you are at risk for osteoporosis and fractures, ask your health care provider if you should be screened.  Ask your health care provider whether you should take a calcium or vitamin D supplement to lower your risk for osteoporosis.  Menopause may have certain physical symptoms and risks.  Hormone replacement therapy may reduce some of these symptoms and risks. Talk to your health care provider about whether hormone replacement therapy is right for you. Follow these instructions at home:  Schedule regular health, dental, and eye exams.  Stay current with your immunizations.  Do not use any tobacco products including cigarettes, chewing tobacco, or electronic cigarettes.  If you are pregnant, do not drink alcohol.  If you are breastfeeding, limit how much and how often you drink alcohol.  Limit alcohol intake to no more than 1 drink per day for nonpregnant women. One drink equals 12 ounces of beer, 5 ounces of wine, or 1 ounces of hard  liquor.  Do not use street drugs.  Do not share needles.  Ask your health care provider for help if you need support or information about quitting drugs.  Tell your health care provider if you often feel depressed.  Tell your health care provider if you have ever been abused or do not feel safe at home. This information is not intended to replace advice given to you by your health care provider. Make sure you discuss any questions you have with your health care provider. Document Released: 11/13/2010 Document Revised: 10/06/2015 Document Reviewed: 02/01/2015 Elsevier Interactive Patient Education  [employer].

## 2017-11-08 NOTE — Assessment & Plan Note (Signed)
Checking lipid panel and adjust as needed. Diet controlled.  

## 2017-11-08 NOTE — Assessment & Plan Note (Signed)
Checking HgA1c, microalbumin to creatinine ratio. Diet controlled. With hyperlipidemia.

## 2017-11-08 NOTE — Assessment & Plan Note (Signed)
Given prior stricture needs indefinite PPI, on dexilant.

## 2017-11-08 NOTE — Assessment & Plan Note (Signed)
Flu shot declines. Pneumonia declines. Shingrix declines. Tetanus declines. Colonoscopy up to date. Mammogram up to date, pap smear aged out and dexa up to date. Counseled about sun safety and mole surveillance. Counseled about the dangers of distracted driving. Given 10 year screening recommendations.  

## 2017-11-08 NOTE — Assessment & Plan Note (Signed)
Refilled amlodipine 2.5 mg daily. Checking CMP and adjust as needed.

## 2017-12-27 DIAGNOSIS — R87610 Atypical squamous cells of undetermined significance on cytologic smear of cervix (ASC-US): Secondary | ICD-10-CM | POA: Diagnosis not present

## 2018-01-23 DIAGNOSIS — H401111 Primary open-angle glaucoma, right eye, mild stage: Secondary | ICD-10-CM | POA: Diagnosis not present

## 2018-01-23 LAB — HM DIABETES EYE EXAM

## 2018-01-27 DIAGNOSIS — Z0100 Encounter for examination of eyes and vision without abnormal findings: Secondary | ICD-10-CM | POA: Diagnosis not present

## 2018-03-28 ENCOUNTER — Encounter: Payer: Self-pay | Admitting: Internal Medicine

## 2018-03-28 ENCOUNTER — Other Ambulatory Visit (INDEPENDENT_AMBULATORY_CARE_PROVIDER_SITE_OTHER): Payer: Medicare HMO

## 2018-03-28 ENCOUNTER — Ambulatory Visit (INDEPENDENT_AMBULATORY_CARE_PROVIDER_SITE_OTHER): Payer: Medicare HMO | Admitting: Internal Medicine

## 2018-03-28 VITALS — BP 120/80 | HR 82 | Temp 98.2°F | Ht 66.0 in | Wt 145.0 lb

## 2018-03-28 DIAGNOSIS — R42 Dizziness and giddiness: Secondary | ICD-10-CM | POA: Diagnosis not present

## 2018-03-28 DIAGNOSIS — E119 Type 2 diabetes mellitus without complications: Secondary | ICD-10-CM

## 2018-03-28 LAB — COMPREHENSIVE METABOLIC PANEL
ALBUMIN: 4.7 g/dL (ref 3.5–5.2)
ALK PHOS: 56 U/L (ref 39–117)
ALT: 11 U/L (ref 0–35)
AST: 16 U/L (ref 0–37)
BUN: 17 mg/dL (ref 6–23)
CO2: 29 mEq/L (ref 19–32)
CREATININE: 1.03 mg/dL (ref 0.40–1.20)
Calcium: 9.9 mg/dL (ref 8.4–10.5)
Chloride: 105 mEq/L (ref 96–112)
GFR: 67.05 mL/min (ref >60.00)
Glucose, Bld: 123 mg/dL — ABNORMAL HIGH (ref 70–99)
Potassium: 4.4 mEq/L (ref 3.5–5.1)
Sodium: 141 mEq/L (ref 135–145)
TOTAL PROTEIN: 7.8 g/dL (ref 6.0–8.3)
Total Bilirubin: 0.8 mg/dL (ref 0.2–1.2)

## 2018-03-28 LAB — CBC
HCT: 41.5 % (ref 36.0–46.0)
Hemoglobin: 13.8 g/dL (ref 12.0–15.0)
MCHC: 33.3 g/dL (ref 30.0–36.0)
MCV: 92.5 fl (ref 78.0–100.0)
Platelets: 238 10*3/uL (ref 150.0–400.0)
RBC: 4.48 Mil/uL (ref 3.87–5.11)
RDW: 14.2 % (ref 11.5–15.5)
WBC: 3.7 10*3/uL — AB (ref 4.0–10.5)

## 2018-03-28 LAB — HEMOGLOBIN A1C: HEMOGLOBIN A1C: 6.7 % — AB (ref 4.6–6.5)

## 2018-03-28 NOTE — Assessment & Plan Note (Signed)
Checking HgA1c today and adjust as needed. Diet controlled.

## 2018-03-28 NOTE — Progress Notes (Signed)
   Subjective:    Patient ID: Monica [name], female    DOB: [birth year], 77 y.o.   MRN: [id number]  HPI The patient is a 77 YO female coming in for dizziness. Happens mostly when she is walking around a lot or doing a lot. She is able to stop and stand or sit and the symptoms pass. She denies syncope. She denies room spinning or vertigo symptoms. She denies change in medication or doses. She denies blood in stool or black stools although does have hemorrhoids and sometimes sees blood when wiping. Denies fevers or chills. Denies cough. She is having some night sweats in the last 2 weeks and this is worrying her. She has had anemia in the past. Denies chest pains or SOB. Denies headaches although had 1 a week or so ago.   Review of Systems  Constitutional: Negative.   HENT: Negative.   Eyes: Negative.   Respiratory: Negative for cough, chest tightness and shortness of breath.   Cardiovascular: Negative for chest pain, palpitations and leg swelling.  Gastrointestinal: Negative for abdominal distention, abdominal pain, constipation, diarrhea, nausea and vomiting.  Endocrine:       Night sweats  Musculoskeletal: Negative.   Skin: Negative.   Neurological: Positive for light-headedness. Negative for dizziness, tremors, seizures, syncope and weakness.  Psychiatric/Behavioral: Negative.       Objective:   Physical Exam  Constitutional: She is oriented to person, place, and time. She appears well-developed and well-nourished.  HENT:  Head: Normocephalic and atraumatic.  Eyes: EOM are normal.  Neck: Normal range of motion.  Cardiovascular: Normal rate and regular rhythm.  Pulmonary/Chest: Effort normal and breath sounds normal. No respiratory distress. She has no wheezes. She has no rales.  Abdominal: Soft. Bowel sounds are normal. She exhibits no distension. There is no tenderness. There is no rebound.  Musculoskeletal: She exhibits no edema.  Neurological: She is alert and oriented to person,  place, and time. Coordination normal.  No dizziness with standing on exam  Skin: Skin is warm and dry.  Psychiatric: She has a normal mood and affect.   Vitals:   03/28/18 1326  BP: 120/80  Pulse: 82  Temp: 98.2 F (36.8 C)  TempSrc: Oral  SpO2: 99%  Weight: 145 lb (65.8 kg)  Height: 5\' 6"  (1.676 m)      Assessment & Plan:

## 2018-03-28 NOTE — Assessment & Plan Note (Signed)
Checking CBC and CMP due to new symptoms. Will temporarily stop her 2.5 mg amlodipine daily. Adjust if needed.

## 2018-03-28 NOTE — Patient Instructions (Addendum)
We will check the blood work today and call you back about the results.   We will have you stop taking the blood pressure pill amlodipine.

## 2018-06-12 ENCOUNTER — Other Ambulatory Visit (INDEPENDENT_AMBULATORY_CARE_PROVIDER_SITE_OTHER): Payer: Medicare HMO

## 2018-06-12 ENCOUNTER — Encounter: Payer: Self-pay | Admitting: Internal Medicine

## 2018-06-12 ENCOUNTER — Ambulatory Visit (INDEPENDENT_AMBULATORY_CARE_PROVIDER_SITE_OTHER): Payer: Medicare HMO | Admitting: Internal Medicine

## 2018-06-12 VITALS — BP 120/80 | HR 68 | Temp 97.3°F | Ht 66.0 in | Wt 145.0 lb

## 2018-06-12 DIAGNOSIS — L299 Pruritus, unspecified: Secondary | ICD-10-CM

## 2018-06-12 DIAGNOSIS — E119 Type 2 diabetes mellitus without complications: Secondary | ICD-10-CM

## 2018-06-12 LAB — COMPREHENSIVE METABOLIC PANEL
ALK PHOS: 53 U/L (ref 39–117)
ALT: 14 U/L (ref 0–35)
AST: 17 U/L (ref 0–37)
Albumin: 4.4 g/dL (ref 3.5–5.2)
BUN: 15 mg/dL (ref 6–23)
CO2: 29 mEq/L (ref 19–32)
Calcium: 9.9 mg/dL (ref 8.4–10.5)
Chloride: 105 mEq/L (ref 96–112)
Creatinine, Ser: 0.9 mg/dL (ref 0.40–1.20)
GFR: 73.68 mL/min (ref >60.00)
Glucose, Bld: 100 mg/dL — ABNORMAL HIGH (ref 70–99)
Potassium: 4 mEq/L (ref 3.5–5.1)
Sodium: 141 mEq/L (ref 135–145)
Total Bilirubin: 0.7 mg/dL (ref 0.2–1.2)
Total Protein: 7 g/dL (ref 6.0–8.3)

## 2018-06-12 LAB — CBC
HCT: 40.5 % (ref 36.0–46.0)
Hemoglobin: 13.2 g/dL (ref 12.0–15.0)
MCHC: 32.6 g/dL (ref 30.0–36.0)
MCV: 92 fl (ref 78.0–100.0)
Platelets: 231 10*3/uL (ref 150.0–400.0)
RBC: 4.4 Mil/uL (ref 3.87–5.11)
RDW: 14.5 % (ref 11.5–15.5)
WBC: 3.4 10*3/uL — ABNORMAL LOW (ref 4.0–10.5)

## 2018-06-12 LAB — VITAMIN D 25 HYDROXY (VIT D DEFICIENCY, FRACTURES): VITD: 35.89 ng/mL (ref 30.00–100.00)

## 2018-06-12 LAB — VITAMIN B12: Vitamin B-12: 389 pg/mL (ref 211–911)

## 2018-06-12 LAB — HEMOGLOBIN A1C: HEMOGLOBIN A1C: 6.6 % — AB (ref 4.6–6.5)

## 2018-06-12 LAB — TSH: TSH: 1.7 u[IU]/mL (ref 0.35–4.50)

## 2018-06-12 NOTE — Progress Notes (Signed)
   Subjective:   Patient ID: Monica [name], female    DOB: July 23, 1940, 78 y.o.   MRN: [id number]  HPI  The patient is a 78 YO female coming in for itching on the bottoms of her feet. Happens rarely and no clear cause. She denies changes in diet or exercise. No changes in shoes. Denies rash that she has noticed. Uses anti-itch cream from dollar store and this usually helps. Maybe happens weekly. Not present today.   Dr. Tyrone Schimke eye doctor.   Review of Systems  Constitutional: Negative.   HENT: Negative.   Eyes: Negative.   Respiratory: Negative for cough, chest tightness and shortness of breath.   Cardiovascular: Negative for chest pain, palpitations and leg swelling.  Gastrointestinal: Negative for abdominal distention, abdominal pain, constipation, diarrhea, nausea and vomiting.  Musculoskeletal: Negative.   Skin: Negative.        pruritus  Neurological: Negative.   Psychiatric/Behavioral: Negative.     Objective:  Physical Exam Constitutional:      Appearance: She is well-developed.  HENT:     Head: Normocephalic and atraumatic.  Neck:     Musculoskeletal: Normal range of motion.  Cardiovascular:     Rate and Rhythm: Normal rate and regular rhythm.  Pulmonary:     Effort: Pulmonary effort is normal. No respiratory distress.     Breath sounds: Normal breath sounds. No wheezing or rales.  Abdominal:     General: Bowel sounds are normal. There is no distension.     Palpations: Abdomen is soft.     Tenderness: There is no abdominal tenderness. There is no rebound.  Skin:    General: Skin is warm and dry.  Neurological:     Mental Status: She is alert and oriented to person, place, and time.     Coordination: Coordination normal.     Vitals:   06/12/18 1312  BP: 120/80  Pulse: 68  Temp: (!) 97.3 F (36.3 C)  TempSrc: Oral  SpO2: 98%  Weight: 145 lb (65.8 kg)  Height: 5\' 6"  (1.676 m)    Assessment & Plan:

## 2018-06-12 NOTE — Patient Instructions (Signed)
We are checking the blood work today to check for the sugars and the itching.

## 2018-06-13 DIAGNOSIS — L299 Pruritus, unspecified: Secondary | ICD-10-CM | POA: Insufficient documentation

## 2018-06-13 NOTE — Assessment & Plan Note (Signed)
Foot exam done, checking HgA1c.

## 2018-06-13 NOTE — Assessment & Plan Note (Signed)
Checking HgA1c, thyroid, vitamin D and B12 and CBC, CMP to rule out metabolic causes. Can continue to use benadryl cream as needed for itching. No rash on exam of feet today.

## 2018-06-16 ENCOUNTER — Encounter: Payer: Self-pay | Admitting: Internal Medicine

## 2018-06-16 NOTE — Progress Notes (Signed)
Abstracted and sent to scan  

## 2018-06-30 ENCOUNTER — Other Ambulatory Visit: Payer: Self-pay | Admitting: Obstetrics and Gynecology

## 2018-06-30 DIAGNOSIS — Z01419 Encounter for gynecological examination (general) (routine) without abnormal findings: Secondary | ICD-10-CM | POA: Diagnosis not present

## 2018-06-30 DIAGNOSIS — Z130 Encounter for screening for diseases of the blood and blood-forming organs and certain disorders involving the immune mechanism: Secondary | ICD-10-CM | POA: Diagnosis not present

## 2018-06-30 DIAGNOSIS — Z124 Encounter for screening for malignant neoplasm of cervix: Secondary | ICD-10-CM | POA: Diagnosis not present

## 2018-06-30 DIAGNOSIS — N6323 Unspecified lump in the left breast, lower outer quadrant: Secondary | ICD-10-CM | POA: Diagnosis not present

## 2018-06-30 DIAGNOSIS — N6320 Unspecified lump in the left breast, unspecified quadrant: Secondary | ICD-10-CM

## 2018-06-30 DIAGNOSIS — Z1389 Encounter for screening for other disorder: Secondary | ICD-10-CM | POA: Diagnosis not present

## 2018-06-30 LAB — HM PAP SMEAR

## 2018-07-07 ENCOUNTER — Ambulatory Visit
Admission: RE | Admit: 2018-07-07 | Discharge: 2018-07-07 | Disposition: A | Discharge status: Home / Self Care | Payer: Medicare HMO | Source: Ambulatory Visit | Attending: Obstetrics and Gynecology | Admitting: Obstetrics and Gynecology

## 2018-07-07 DIAGNOSIS — R921 Mammographic calcification found on diagnostic imaging of breast: Secondary | ICD-10-CM | POA: Diagnosis not present

## 2018-07-07 DIAGNOSIS — N6320 Unspecified lump in the left breast, unspecified quadrant: Secondary | ICD-10-CM

## 2018-07-07 DIAGNOSIS — N6324 Unspecified lump in the left breast, lower inner quadrant: Secondary | ICD-10-CM | POA: Diagnosis not present

## 2018-07-28 DIAGNOSIS — H401111 Primary open-angle glaucoma, right eye, mild stage: Secondary | ICD-10-CM | POA: Diagnosis not present

## 2018-08-06 ENCOUNTER — Telehealth: Payer: Self-pay | Admitting: *Deleted

## 2018-08-06 NOTE — Telephone Encounter (Signed)
Called patient to inform them the nurse needs to either convert their upcoming AWV to a virtual visit or reschedule the visit out into the future due to covid-19 safety measures. Patient requested to reschedule, AWV scheduled for 09/25/18.

## 2018-08-14 ENCOUNTER — Ambulatory Visit: Payer: Medicare HMO

## 2018-09-18 NOTE — Progress Notes (Signed)
Subjective:   Monica [name] is a 78 y.o. female who presents for Medicare Annual (Subsequent) preventive examination.  I connected with patient 09/19/18 at  9:00 AM EDT by a telephone and verified that I am speaking with the correct person using two identifiers. Patient stated full name and DOB. Patient gave permission to continue with telephonic visit. Patient's location was at home and Nurse's location was at [place] office.   Review of Systems:  No ROS.  Medicare Wellness Virtual Visit.  Visual/audio telehealth visit, UTA vital signs.   See social history for additional risk factors.    Sleep patterns: no sleep issues, feels rested on waking, gets up 0-1 times nightly to void and sleeps 9-10 hours nightly.    Home Safety/Smoke Alarms: Feels safe in home. Smoke alarms in place.  Living environment; residence and Firearm Safety: 1-story house/ trailer. Lives alone, no needs for DME, good support system Seat Belt Safety/Bike Helmet: Wears seat belt.     Objective:     Vitals: There were no vitals taken for this visit.  There is no height or weight on file to calculate BMI.  Advanced Directives 08/12/2017 10/31/2016 04/30/2016 04/28/2015 10/28/2014 07/29/2014 05/10/2014  Does Patient Have a Medical Advance Directive? No No No No No No No  Would patient like information on creating a medical advance directive? Yes (ED - Information included in AVS) No - Patient declined No - Patient declined No - patient declined information No - patient declined information No - patient declined information No - patient declined information    Tobacco Social History   Tobacco Use  Smoking Status Never Smoker  Smokeless Tobacco Never Used     Counseling given: Not Answered  Past Medical History:  Diagnosis Date  . ANXIETY   . Arthritis   . Chronic constipation   . 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 DEPRESSION, SITUATIONAL   . Diabetes mellitus ([place]) dx 02/2013  . GERD  (gastroesophageal reflux disease)    w/ HH (EGD 2010)  . GLAUCOMA   . Hypertension   . Iron deficiency anemia due to chronic blood loss 02/05/2014  . LOW BACK PAIN, CHRONIC   . Mixed hyperlipidemia   . OSTEOPENIA   . Stricture and stenosis of esophagus 2006   Past Surgical History:  Procedure Laterality Date  . BACK SURGERY    . ESOPHAGEAL MANOMETRY N/A 02/09/2013   Procedure: ESOPHAGEAL MANOMETRY (EM);  Surgeon: Sable Feil, MD;  Location: [facility];  Service: Endoscopy;  Laterality: N/A;  . EYE SURGERY    . HEMORROIDECTOMY    . HYSTEROSCOPY W/D&C  07/20/2011   Procedure: DILATATION AND CURETTAGE /HYSTEROSCOPY;  Surgeon: Melina Schools, MD;  Location: [facility];  Service: Gynecology;  Laterality: N/A;  . POLYPECTOMY  07/20/2011   Procedure: POLYPECTOMY;  Surgeon: Melina Schools, MD;  Location: [facility];  Service: Gynecology;  Laterality: N/A;  . ROTATOR CUFF REPAIR     right   . TUBAL LIGATION     Family History  Problem Relation Age of Onset  . Heart failure Father   . Diabetes Mother   . Breast cancer Sister   . Colon cancer Sister 40  . Diabetes Brother    Social History   Socioeconomic History  . Marital status: Divorced    Spouse name: Not on file  . Number of children: 5  . Years of education: Not on file  . Highest education level: Not on file  Occupational History  . Occupation: retired  Scientific laboratory technician  . Financial resource strain: Somewhat hard  . Food insecurity:    Worry: Sometimes true    Inability: Sometimes true  . Transportation needs:    Medical: No    Non-medical: No  Tobacco Use  . Smoking status: Never Smoker  . Smokeless tobacco: Never Used  Substance and Sexual Activity  . Alcohol use: No    Alcohol/week: 0.0 standard drinks  . Drug use: No  . Sexual activity: Never  Lifestyle  . Physical activity:    Days per week: 4 days    Minutes per session: 40 min  . Stress: Not at all  Relationships  . Social connections:    Talks on phone:  More than three times a week    Gets together: More than three times a week    Attends religious service: More than 4 times per year    Active member of club or organization: Yes    Attends meetings of clubs or organizations: More than 4 times per year    Relationship status: Divorced  Other Topics Concern  . Not on file  Social History Narrative  . Not on file    Outpatient Encounter Medications as of 09/19/2018  Medication Sig  . amLODipine (NORVASC) 5 MG tablet Take 0.5 tablets (2.5 mg total) by mouth daily.  [name] aspirin 81 MG tablet Take 81 mg by mouth daily.    . Cholecalciferol (VITAMIN D3) 1000 UNITS CAPS Take 1,000 Units by mouth daily.  [name] dexlansoprazole (DEXILANT) 60 MG capsule Take 1 capsule (60 mg total) by mouth daily.  . meloxicam (MOBIC) 7.5 MG tablet Take 1 tablet (7.5 mg total) by mouth daily.  . Probiotic Product (PROBIOTIC DAILY PO) Take 1 capsule by mouth daily.   No facility-administered encounter medications on file as of 09/19/2018.     Activities of Daily Living No flowsheet data found.  Patient Care Team: [name], MD as PCP - General (Internal Medicine) Newton Pigg, MD (Obstetrics and Gynecology) Katy Apo, MD (Ophthalmology) Inda Castle, MD (Inactive) (Gastroenterology) Sable Feil, MD as Consulting Physician (Gastroenterology)    Assessment:   This is a routine wellness examination for [place]. Physical assessment deferred to PCP.  Exercise Activities and Dietary recommendations   Diet (meal preparation, eat out, water intake, caffeinated beverages, dairy products, fruits and vegetables): in general, a "healthy" diet  , well balanced eats a variety of fruits and vegetables daily, limits salt, fat/cholesterol, sugar,carbohydrates,caffeine, drinks 6-8 glasses of water daily.  Goals    . Patient Stated     Continue to reach out others and help all that I can. Continue to eat healthy, exercise, enjoy life and worship God.        Fall Risk Fall Risk  08/12/2017 04/19/2016 04/11/2015 06/02/2014 07/17/2013  Falls in the past year? No No Yes Yes Yes  Number falls in past yr: - - 1 1 1   Injury with Fall? - - No No -  Risk for fall due to : - - - Other (Comment) -   Depression Screen PHQ 2/9 Scores 08/12/2017 04/19/2016 04/11/2015 07/17/2013  PHQ - 2 Score 1 0 0 0  PHQ- 9 Score 1 - - -     Cognitive Function MMSE - Mini Mental State Exam 08/12/2017  Orientation to time 5  Orientation to Place 5  Registration 3  Attention/ Calculation 4  Recall 2  Language- name 2 objects 2  Language-  repeat 1  Language- follow 3 step command 3  Language- read & follow direction 1  Write a sentence 1  Copy design 1  Total score 28       Ad8 score reviewed for issues:  Issues making decisions: no  Less interest in hobbies / activities: no  Repeats questions, stories (family complaining): no  Trouble using ordinary gadgets (microwave, computer, phone):no  Forgets the month or year: no  Mismanaging finances: no  Remembering appts: no  Daily problems with thinking and/or memory: no Ad8 score is= 0  There is no immunization history for the selected administration types on file for this patient.   Screening Tests Health Maintenance  Topic Date Due  . PNA vac Low Risk Adult (1 of 2 - PCV13) 07/15/2007  . URINE MICROALBUMIN  11/08/2018  . TETANUS/TDAP  11/08/2018 (Originally 07/14/1959)  . COLONOSCOPY  09/22/2018  . HEMOGLOBIN A1C  12/11/2018  . INFLUENZA VACCINE  12/13/2018  . OPHTHALMOLOGY EXAM  01/24/2019  . FOOT EXAM  06/14/2019  . DEXA SCAN  Completed       Plan:     Reviewed health maintenance screenings with patient today and relevant education, vaccines, and/or referrals were provided.   Continue doing brain stimulating activities (puzzles, reading, adult coloring books, staying active) to keep memory sharp.   Continue to eat heart healthy diet (full of fruits, vegetables, whole grains, lean  protein, water--limit salt, fat, and sugar intake) and increase physical activity as tolerated.  I have personally reviewed and noted the following in the patient's chart:   . Medical and social history . Use of alcohol, tobacco or illicit drugs  . Current medications and supplements . Functional ability and status . Nutritional status . Physical activity . Advanced directives . List of other physicians . Screenings to include cognitive, depression, and falls . Referrals and appointments  In addition, I have reviewed and discussed with patient certain preventive protocols, quality metrics, and best practice recommendations. A written personalized care plan for preventive services as well as general preventive health recommendations were provided to patient.     Michiel Cowboy, RN  09/18/2018

## 2018-09-19 ENCOUNTER — Ambulatory Visit (INDEPENDENT_AMBULATORY_CARE_PROVIDER_SITE_OTHER): Payer: Medicare HMO | Admitting: *Deleted

## 2018-09-19 DIAGNOSIS — Z0000 Encounter for general adult medical examination without abnormal findings: Secondary | ICD-10-CM

## 2018-09-19 NOTE — Progress Notes (Signed)
Medical screening examination/treatment/procedure(s) were performed by non-physician practitioner and as supervising physician I was immediately available for consultation/collaboration. I agree with above. Broly Hatfield A Amaya Blakeman, MD 

## 2018-09-19 NOTE — Patient Instructions (Addendum)
Continue doing brain stimulating activities (puzzles, reading, adult coloring books, staying active) to keep memory sharp.   Continue to eat heart healthy diet (full of fruits, vegetables, whole grains, lean protein, water--limit salt, fat, and sugar intake) and increase physical activity as tolerated.   Monica [name] , Thank you for taking time to come for your Medicare Wellness Visit. I appreciate your ongoing commitment to your health goals. Please review the following plan we discussed and let me know if I can assist you in the future.   These are the goals we discussed: Goals    . Patient Stated     Continue to reach out others and help all that I can. Continue to eat healthy, exercise, enjoy life and worship God.       This is a list of the screening recommended for you and due dates:  Health Maintenance  Topic Date Due  . Urine Protein Check  11/08/2018  . Tetanus Vaccine  11/08/2018*  . Pneumonia vaccines (1 of 2 - PCV13) 09/19/2019*  . Colon Cancer Screening  09/22/2018  . Hemoglobin A1C  12/11/2018  . Flu Shot  12/13/2018  . Eye exam for diabetics  01/24/2019  . Complete foot exam   06/14/2019  . DEXA scan (bone density measurement)  Completed  *Topic was postponed. The date shown is not the original due date.      Preventive Care 76 Years and Older, Female Preventive care refers to lifestyle choices and visits with your health care provider that can promote health and wellness. What does preventive care include?  A yearly physical exam. This is also called an annual well check.  Dental exams once or twice a year.  Routine eye exams. Ask your health care provider how often you should have your eyes checked.  Personal lifestyle choices, including: ? Daily care of your teeth and gums. ? Regular physical activity. ? Eating a healthy diet. ? Avoiding tobacco and drug use. ? Limiting alcohol use. ? Practicing safe sex. ? Taking low-dose aspirin every day. ? Taking  vitamin and mineral supplements as recommended by your health care provider. What happens during an annual well check? The services and screenings done by your health care provider during your annual well check will depend on your age, overall health, lifestyle risk factors, and family history of disease. Counseling Your health care provider may ask you questions about your:  Alcohol use.  Tobacco use.  Drug use.  Emotional well-being.  Home and relationship well-being.  Sexual activity.  Eating habits.  History of falls.  Memory and ability to understand (cognition).  Work and work Statistician.  Reproductive health.  Screening You may have the following tests or measurements:  Height, weight, and BMI.  Blood pressure.  Lipid and cholesterol levels. These may be checked every 5 years, or more frequently if you are over 76 years old.  Skin check.  Lung cancer screening. You may have this screening every year starting at age 76 if you have a 30-pack-year history of smoking and currently smoke or have quit within the past 15 years.  Colorectal cancer screening. All adults should have this screening starting at age 50 and continuing until age 76. You will have tests every 1-10 years, depending on your results and the type of screening test. People at increased risk should start screening at an earlier age. Screening tests may include: ? Guaiac-based fecal occult blood testing. ? Fecal immunochemical test (FIT). ? Stool DNA test. ? Virtual  colonoscopy. ? Sigmoidoscopy. During this test, a flexible tube with a tiny camera (sigmoidoscope) is used to examine your rectum and lower colon. The sigmoidoscope is inserted through your anus into your rectum and lower colon. ? Colonoscopy. During this test, a long, thin, flexible tube with a tiny camera (colonoscope) is used to examine your entire colon and rectum.  Hepatitis C blood test.  Hepatitis B blood test.  Sexually  transmitted disease (STD) testing.  Diabetes screening. This is done by checking your blood sugar (glucose) after you have not eaten for a while (fasting). You may have this done every 1-3 years.  Bone density scan. This is done to screen for osteoporosis. You may have this done starting at age 76.  Mammogram. This may be done every 1-2 years. Talk to your health care provider about how often you should have regular mammograms. Talk with your health care provider about your test results, treatment options, and if necessary, the need for more tests. Vaccines Your health care provider may recommend certain vaccines, such as:  Influenza vaccine. This is recommended every year.  Tetanus, diphtheria, and acellular pertussis (Tdap, Td) vaccine. You may need a Td booster every 10 years.  Varicella vaccine. You may need this if you have not been vaccinated.  Zoster vaccine. You may need this after age 76.  Measles, mumps, and rubella (MMR) vaccine. You may need at least one dose of MMR if you were born in 1957 or later. You may also need a second dose.  Pneumococcal 13-valent conjugate (PCV13) vaccine. One dose is recommended after age 76.  Pneumococcal polysaccharide (PPSV23) vaccine. One dose is recommended after age 76.  Meningococcal vaccine. You may need this if you have certain conditions.  Hepatitis A vaccine. You may need this if you have certain conditions or if you travel or work in places where you may be exposed to hepatitis A.  Hepatitis B vaccine. You may need this if you have certain conditions or if you travel or work in places where you may be exposed to hepatitis B.  Haemophilus influenzae type b (Hib) vaccine. You may need this if you have certain conditions. Talk to your health care provider about which screenings and vaccines you need and how often you need them. This information is not intended to replace advice given to you by your health care provider. Make sure you  discuss any questions you have with your health care provider. Document Released: 05/27/2015 Document Revised: 06/20/2017 Document Reviewed: 03/01/2015 Elsevier Interactive Patient Education  2019 [employer].

## 2018-09-25 ENCOUNTER — Ambulatory Visit: Payer: Medicare HMO

## 2018-10-06 ENCOUNTER — Encounter: Payer: Self-pay | Admitting: Gastroenterology

## 2018-10-15 ENCOUNTER — Other Ambulatory Visit: Payer: Self-pay

## 2018-10-15 ENCOUNTER — Ambulatory Visit: Payer: Medicare HMO | Admitting: *Deleted

## 2018-10-15 VITALS — Ht 66.0 in | Wt 148.0 lb

## 2018-10-15 DIAGNOSIS — Z86010 Personal history of colonic polyps: Secondary | ICD-10-CM

## 2018-10-15 DIAGNOSIS — Z800 Family history of malignant neoplasm of digestive organs: Secondary | ICD-10-CM

## 2018-10-15 MED ORDER — NA SULFATE-K SULFATE-MG SULF 17.5-3.13-1.6 GM/177ML PO SOLN: ORAL | 0 refills | Status: DC

## 2018-10-15 NOTE — Progress Notes (Signed)
Patient's pre-visit was done today over the phone with the patient due to Covid-19. Name,DOB and address verified. Insurance verified. Packet of Prep instructions mailed to patient including copy of a consent form and pre-procedure patient acknowledgement form-pt is aware. Patient understands to call us back with any questions or concerns.   Patient denies any allergies to eggs or soy. Patient denies any problems with anesthesia/sedation. Patient denies any oxygen use at home. Patient denies taking any diet/weight loss medications or blood thinners. EMMI education assisgned to patient on colonoscopy, this was explained and instructions given to patient.

## 2018-10-21 ENCOUNTER — Encounter: Payer: Self-pay | Admitting: Gastroenterology

## 2018-10-21 ENCOUNTER — Other Ambulatory Visit: Payer: Self-pay

## 2018-10-21 ENCOUNTER — Telehealth: Payer: Self-pay | Admitting: Gastroenterology

## 2018-10-21 DIAGNOSIS — Z800 Family history of malignant neoplasm of digestive organs: Secondary | ICD-10-CM

## 2018-10-21 DIAGNOSIS — Z86010 Personal history of colonic polyps: Secondary | ICD-10-CM

## 2018-10-21 MED ORDER — NA SULFATE-K SULFATE-MG SULF 17.5-3.13-1.6 GM/177ML PO SOLN: ORAL | 0 refills | Status: DC

## 2018-10-21 NOTE — Telephone Encounter (Signed)
Called and spoke to pt.  She said [facility] said they did not receive the script we sent.  Prescription resent to [facility] in [place]. Confirmed receipt.

## 2018-10-21 NOTE — Telephone Encounter (Signed)
Pt stated that pharmacy has not received Suprep prescription.

## 2018-10-27 ENCOUNTER — Telehealth: Payer: Self-pay | Admitting: Gastroenterology

## 2018-10-27 NOTE — Telephone Encounter (Signed)
Called and spoke to patient.  She was concerned about the warnings on the box of suprep. I encouraged her not to worry and that it was very safe. She also asked about mixing something in with the prep if she doesn't like the way it tastes.  I went through the instructions with her again. Let her know she could put a piece of hard candy in her mouth while drinking prep if that would help/ She expressed understanding.

## 2018-10-27 NOTE — Telephone Encounter (Signed)
Great thanks for your help Jan.

## 2018-10-27 NOTE — Telephone Encounter (Signed)
Jan this patient apparently called in to Dr. Collene Mares over the weekend about a prep question. She is scheduled for a colonoscopy Wed. Can you call her and see what she needs? Thanks much

## 2018-10-28 ENCOUNTER — Telehealth: Payer: Self-pay | Admitting: Gastroenterology

## 2018-10-28 NOTE — Telephone Encounter (Signed)

## 2018-10-29 ENCOUNTER — Other Ambulatory Visit: Payer: Self-pay

## 2018-10-29 ENCOUNTER — Ambulatory Visit (AMBULATORY_SURGERY_CENTER): Payer: Medicare HMO | Admitting: Gastroenterology

## 2018-10-29 ENCOUNTER — Encounter: Payer: Self-pay | Admitting: Gastroenterology

## 2018-10-29 VITALS — BP 133/71 | HR 57 | Temp 98.4°F | Resp 16 | Ht 66.0 in | Wt 145.0 lb

## 2018-10-29 DIAGNOSIS — D123 Benign neoplasm of transverse colon: Secondary | ICD-10-CM | POA: Diagnosis not present

## 2018-10-29 DIAGNOSIS — D120 Benign neoplasm of cecum: Secondary | ICD-10-CM

## 2018-10-29 DIAGNOSIS — D124 Benign neoplasm of descending colon: Secondary | ICD-10-CM | POA: Diagnosis not present

## 2018-10-29 DIAGNOSIS — D125 Benign neoplasm of sigmoid colon: Secondary | ICD-10-CM | POA: Diagnosis not present

## 2018-10-29 DIAGNOSIS — Z86010 Personal history of colonic polyps: Secondary | ICD-10-CM

## 2018-10-29 MED ORDER — SODIUM CHLORIDE 0.9 % IV SOLN: 500.0000 mL | Freq: Once | INTRAVENOUS | Status: DC

## 2018-10-29 NOTE — Progress Notes (Signed)
Called to room to assist during endoscopic procedure.  Patient ID and intended procedure confirmed with present staff. Received instructions for my participation in the procedure from the performing physician.  

## 2018-10-29 NOTE — Patient Instructions (Signed)
Handouts given: Polyps, and Hemorrhoids.   YOU HAD AN ENDOSCOPIC PROCEDURE TODAY AT [facility]:   Refer to the procedure report that was given to you for any specific questions about what was found during the examination.  If the procedure report does not answer your questions, please call your gastroenterologist to clarify.  If you requested that your care partner not be given the details of your procedure findings, then the procedure report has been included in a sealed envelope for you to review at your convenience later.  YOU SHOULD EXPECT: Some feelings of bloating in the abdomen. Passage of more gas than usual.  Walking can help get rid of the air that was put into your GI tract during the procedure and reduce the bloating. If you had a lower endoscopy (such as a colonoscopy or flexible sigmoidoscopy) you may notice spotting of blood in your stool or on the toilet paper. If you underwent a bowel prep for your procedure, you may not have a normal bowel movement for a few days.  Please Note:  You might notice some irritation and congestion in your nose or some drainage.  This is from the oxygen used during your procedure.  There is no need for concern and it should clear up in a day or so.  SYMPTOMS TO REPORT IMMEDIATELY:   Following lower endoscopy (colonoscopy or flexible sigmoidoscopy):  Excessive amounts of blood in the stool  Significant tenderness or worsening of abdominal pains  Swelling of the abdomen that is new, acute  Fever of 100F or higher   For urgent or emergent issues, a gastroenterologist can be reached at any hour by calling [phone].   DIET:  We do recommend a small meal at first, but then you may proceed to your regular diet.  Drink plenty of fluids but you should avoid alcoholic beverages for 24 hours.  ACTIVITY:  You should plan to take it easy for the rest of today and you should NOT DRIVE or use heavy machinery until tomorrow (because of  the sedation medicines used during the test).    FOLLOW UP: Our staff will call the number listed on your records 48-72 hours following your procedure to check on you and address any questions or concerns that you may have regarding the information given to you following your procedure. If we do not reach you, we will leave a message.  We will attempt to reach you two times.  During this call, we will ask if you have developed any symptoms of COVID 19. If you develop any symptoms (ie: fever, flu-like symptoms, shortness of breath, cough etc.) before then, please call [phone].  If you test positive for Covid 19 in the 2 weeks post procedure, please call and report this information to [place].    If any biopsies were taken you will be contacted by phone or by letter within the next 1-3 weeks.  Please call us at [phone] if you have not heard about the biopsies in 3 weeks.    SIGNATURES/CONFIDENTIALITY: You and/or your care partner have signed paperwork which will be entered into your electronic medical record.  These signatures attest to the fact that that the information above on your After Visit Summary has been reviewed and is understood.  Full responsibility of the confidentiality of this discharge information lies with you and/or your care-partner.Please Note:  You might notice some irritation and congestion in your nose or some drainage.  This is from the  oxygen used during your procedure.  There is no need for concern and it should clear up in a day or so.

## 2018-10-29 NOTE — Progress Notes (Signed)
A/ox3, pleased with MAC, report to RN 

## 2018-10-29 NOTE — Op Note (Signed)
[place] Patient Name: Monica [name] Date: 10/29/2018 1:36 PM MRN: [id number] Endoscopist: Remo Lipps P. Havery Moros , MD Age: 78 Referring MD:  Date of Birth: Sep 08, 1940 Gender: Female Account #: [id number] [name]:                Colonoscopy Indications:              Surveillance: Personal history of adenomatous                            polyps on last colonoscopy 5 years ago Medicines:                Monitored Anesthesia Care [name]:                Pre-Anesthesia Assessment:                           - Prior to the [name], a History and Physical                            was performed, and patient medications and                            allergies were reviewed. The patient's tolerance of                            previous anesthesia was also reviewed. The risks                            and benefits of the [name] and the sedation                            options and risks were discussed with the patient.                            All questions were answered, and informed consent                            was obtained. Prior Anticoagulants: The patient has                            taken no previous anticoagulant or antiplatelet                            agents. ASA Grade Assessment: II - A patient with                            mild systemic disease. After reviewing the risks                            and benefits, the patient was deemed in                            satisfactory condition to undergo the [name].  After obtaining informed consent, the colonoscope                            was passed under direct vision. Throughout the                            [name], the patient's blood pressure, pulse, and                            oxygen saturations were monitored continuously. The                            Colonoscope was introduced through the anus and                            advanced to the the  cecum, identified by                            appendiceal orifice and ileocecal valve. The                            colonoscopy was performed without difficulty. The                            patient tolerated the [name] well. The quality                            of the bowel preparation was adequate. The                            ileocecal valve, appendiceal orifice, and rectum                            were photographed. Scope In: 1:37:18 PM Scope Out: 1:58:49 PM Scope Withdrawal Time: 0 hours 14 minutes 59 seconds  Total [name] Duration: 0 hours 21 minutes 31 seconds  Findings:                 The perianal and digital rectal examinations were                            normal.                           A 3 mm polyp was found in the cecum. The polyp was                            sessile. The polyp was removed with a cold snare.                            Resection and retrieval were complete.                           A 2 mm polyp was found in the transverse colon. The  polyp was sessile. The polyp was removed with a                            cold snare. Resection and retrieval were complete.                           A 2 mm polyp was found in the descending colon. The                            polyp was sessile. The polyp was removed with a                            cold snare. Resection and retrieval were complete.                           A 4 to 5 mm polyp was found in the sigmoid colon.                            The polyp was sessile. The polyp was removed with a                            cold snare. Resection and retrieval were complete.                           Internal hemorrhoids were found during                            retroflexion, with suspected stigmata of prior                            banding noted.                           The exam was otherwise without abnormality. Complications:            No immediate  complications. Estimated blood loss:                            Minimal. Estimated Blood Loss:     Estimated blood loss was minimal. Impression:               - One 3 mm polyp in the cecum, removed with a cold                            snare. Resected and retrieved.                           - One 2 mm polyp in the transverse colon, removed                            with a cold snare. Resected and retrieved.                           - One 2  mm polyp in the descending colon, removed                            with a cold snare. Resected and retrieved.                           - One 4 to 5 mm polyp in the sigmoid colon, removed                            with a cold snare. Resected and retrieved.                           - Internal hemorrhoids.                           - The examination was otherwise normal. Recommendation:           - Patient has a contact number available for                            emergencies. The signs and symptoms of potential                            delayed complications were discussed with the                            patient. Return to normal activities tomorrow.                            Written discharge instructions were provided to the                            patient.                           - Resume previous diet.                           - Continue present medications.                           - Await pathology results. Remo Lipps P. Nyashia Raney, MD 10/29/2018 2:03:07 PM This report has been signed electronically.

## 2018-10-30 DIAGNOSIS — E1142 Type 2 diabetes mellitus with diabetic polyneuropathy: Secondary | ICD-10-CM | POA: Diagnosis not present

## 2018-10-30 DIAGNOSIS — M79676 Pain in unspecified toe(s): Secondary | ICD-10-CM | POA: Diagnosis not present

## 2018-10-30 DIAGNOSIS — L84 Corns and callosities: Secondary | ICD-10-CM | POA: Diagnosis not present

## 2018-10-30 DIAGNOSIS — B351 Tinea unguium: Secondary | ICD-10-CM | POA: Diagnosis not present

## 2018-10-31 ENCOUNTER — Telehealth: Payer: Self-pay

## 2018-10-31 ENCOUNTER — Telehealth: Payer: Self-pay | Admitting: *Deleted

## 2018-10-31 DIAGNOSIS — H2513 Age-related nuclear cataract, bilateral: Secondary | ICD-10-CM | POA: Diagnosis not present

## 2018-10-31 DIAGNOSIS — H524 Presbyopia: Secondary | ICD-10-CM | POA: Diagnosis not present

## 2018-10-31 DIAGNOSIS — H40023 Open angle with borderline findings, high risk, bilateral: Secondary | ICD-10-CM | POA: Diagnosis not present

## 2018-10-31 NOTE — Telephone Encounter (Signed)
Follow up call made, number identifier, left voicemail.

## 2018-11-06 ENCOUNTER — Encounter: Payer: Self-pay | Admitting: Gastroenterology

## 2018-11-12 DIAGNOSIS — D259 Leiomyoma of uterus, unspecified: Secondary | ICD-10-CM | POA: Diagnosis not present

## 2018-11-12 DIAGNOSIS — N950 Postmenopausal bleeding: Secondary | ICD-10-CM | POA: Diagnosis not present

## 2018-11-13 ENCOUNTER — Encounter: Payer: Self-pay | Admitting: Internal Medicine

## 2018-11-13 NOTE — Progress Notes (Signed)
Abstracted and sent to scan  

## 2018-11-19 DIAGNOSIS — N950 Postmenopausal bleeding: Secondary | ICD-10-CM | POA: Diagnosis not present

## 2018-11-25 DIAGNOSIS — H401132 Primary open-angle glaucoma, bilateral, moderate stage: Secondary | ICD-10-CM | POA: Diagnosis not present

## 2018-11-26 DIAGNOSIS — D259 Leiomyoma of uterus, unspecified: Secondary | ICD-10-CM | POA: Diagnosis not present

## 2018-11-26 DIAGNOSIS — N950 Postmenopausal bleeding: Secondary | ICD-10-CM | POA: Diagnosis not present

## 2018-11-28 ENCOUNTER — Other Ambulatory Visit: Payer: Self-pay | Admitting: Internal Medicine

## 2018-12-09 DIAGNOSIS — H401132 Primary open-angle glaucoma, bilateral, moderate stage: Secondary | ICD-10-CM | POA: Diagnosis not present

## 2018-12-10 ENCOUNTER — Ambulatory Visit (INDEPENDENT_AMBULATORY_CARE_PROVIDER_SITE_OTHER): Payer: Medicare HMO | Admitting: Internal Medicine

## 2018-12-10 ENCOUNTER — Other Ambulatory Visit: Payer: Self-pay

## 2018-12-10 ENCOUNTER — Encounter: Payer: Self-pay | Admitting: Internal Medicine

## 2018-12-10 ENCOUNTER — Other Ambulatory Visit (INDEPENDENT_AMBULATORY_CARE_PROVIDER_SITE_OTHER): Payer: Medicare HMO

## 2018-12-10 VITALS — BP 124/74 | HR 66 | Temp 97.9°F | Ht 66.0 in | Wt 149.0 lb

## 2018-12-10 DIAGNOSIS — K2100 Gastro-esophageal reflux disease with esophagitis, without bleeding: Secondary | ICD-10-CM

## 2018-12-10 DIAGNOSIS — I10 Essential (primary) hypertension: Secondary | ICD-10-CM

## 2018-12-10 DIAGNOSIS — Z0000 Encounter for general adult medical examination without abnormal findings: Secondary | ICD-10-CM | POA: Diagnosis not present

## 2018-12-10 DIAGNOSIS — E785 Hyperlipidemia, unspecified: Secondary | ICD-10-CM

## 2018-12-10 DIAGNOSIS — E119 Type 2 diabetes mellitus without complications: Secondary | ICD-10-CM

## 2018-12-10 DIAGNOSIS — E1169 Type 2 diabetes mellitus with other specified complication: Secondary | ICD-10-CM | POA: Diagnosis not present

## 2018-12-10 LAB — COMPREHENSIVE METABOLIC PANEL
ALT: 11 U/L (ref 0–35)
AST: 15 U/L (ref 0–37)
Albumin: 4.4 g/dL (ref 3.5–5.2)
Alkaline Phosphatase: 55 U/L (ref 39–117)
BUN: 14 mg/dL (ref 6–23)
CO2: 27 mEq/L (ref 19–32)
Calcium: 9.8 mg/dL (ref 8.4–10.5)
Chloride: 106 mEq/L (ref 96–112)
Creatinine, Ser: 0.89 mg/dL (ref 0.40–1.20)
GFR: 74.53 mL/min (ref >60.00)
Glucose, Bld: 108 mg/dL — ABNORMAL HIGH (ref 70–99)
Potassium: 4.1 mEq/L (ref 3.5–5.1)
Sodium: 141 mEq/L (ref 135–145)
Total Bilirubin: 0.8 mg/dL (ref 0.2–1.2)
Total Protein: 7.5 g/dL (ref 6.0–8.3)

## 2018-12-10 LAB — CBC
HCT: 40.5 % (ref 36.0–46.0)
Hemoglobin: 13.2 g/dL (ref 12.0–15.0)
MCHC: 32.6 g/dL (ref 30.0–36.0)
MCV: 92.4 fl (ref 78.0–100.0)
Platelets: 235 10*3/uL (ref 150.0–400.0)
RBC: 4.38 Mil/uL (ref 3.87–5.11)
RDW: 14.2 % (ref 11.5–15.5)
WBC: 3.4 10*3/uL — ABNORMAL LOW (ref 4.0–10.5)

## 2018-12-10 LAB — LIPID PANEL
Cholesterol: 201 mg/dL — ABNORMAL HIGH (ref 0–200)
HDL: 54.2 mg/dL (ref >39.00)
LDL Cholesterol: 130 mg/dL — ABNORMAL HIGH (ref 0–99)
NonHDL: 147.05
Total CHOL/HDL Ratio: 4
Triglycerides: 84 mg/dL (ref 0.0–149.0)
VLDL: 16.8 mg/dL (ref 0.0–40.0)

## 2018-12-10 LAB — MICROALBUMIN / CREATININE URINE RATIO
Creatinine,U: 123.7 mg/dL
Microalb Creat Ratio: 0.9 mg/g (ref 0.0–30.0)
Microalb, Ur: 1.1 mg/dL (ref 0.0–1.9)

## 2018-12-10 LAB — HEMOGLOBIN A1C: Hgb A1c MFr Bld: 6.8 % — ABNORMAL HIGH (ref 4.6–6.5)

## 2018-12-10 LAB — FERRITIN: Ferritin: 73.7 ng/mL (ref 10.0–291.0)

## 2018-12-10 MED ORDER — DEXILANT 60 MG PO CPDR: 1.0000 | DELAYED_RELEASE_CAPSULE | Freq: Every day | ORAL | 3 refills | Status: DC

## 2018-12-10 MED ORDER — AMLODIPINE BESYLATE 5 MG PO TABS: 2.5000 mg | ORAL_TABLET | Freq: Every day | ORAL | 3 refills | Status: DC

## 2018-12-10 NOTE — Patient Instructions (Signed)
Health Maintenance, Female Adopting a healthy lifestyle and getting preventive care are important in promoting health and wellness. Ask your health care provider about:  The right schedule for you to have regular tests and exams.  Things you can do on your own to prevent diseases and keep yourself healthy. What should I know about diet, weight, and exercise? Eat a healthy diet   Eat a diet that includes plenty of vegetables, fruits, low-fat dairy products, and lean protein.  Do not eat a lot of foods that are high in solid fats, added sugars, or sodium. Maintain a healthy weight Body mass index (BMI) is used to identify weight problems. It estimates body fat based on height and weight. Your health care provider can help determine your BMI and help you achieve or maintain a healthy weight. Get regular exercise Get regular exercise. This is one of the most important things you can do for your health. Most adults should:  Exercise for at least 150 minutes each week. The exercise should increase your heart rate and make you sweat (moderate-intensity exercise).  Do strengthening exercises at least twice a week. This is in addition to the moderate-intensity exercise.  Spend less time sitting. Even light physical activity can be beneficial. Watch cholesterol and blood lipids Have your blood tested for lipids and cholesterol at 76 years of age, then have this test every 5 years. Have your cholesterol levels checked more often if:  Your lipid or cholesterol levels are high.  You are older than 76 years of age.  You are at high risk for heart disease. What should I know about cancer screening? Depending on your health history and family history, you may need to have cancer screening at various ages. This may include screening for:  Breast cancer.  Cervical cancer.  Colorectal cancer.  Skin cancer.  Lung cancer. What should I know about heart disease, diabetes, and high blood  pressure? Blood pressure and heart disease  High blood pressure causes heart disease and increases the risk of stroke. This is more likely to develop in people who have high blood pressure readings, are of African descent, or are overweight.  Have your blood pressure checked: ? Every 3-5 years if you are 18-39 years of age. ? Every year if you are 40 years old or older. Diabetes Have regular diabetes screenings. This checks your fasting blood sugar level. Have the screening done:  Once every three years after age 40 if you are at a normal weight and have a low risk for diabetes.  More often and at a younger age if you are overweight or have a high risk for diabetes. What should I know about preventing infection? Hepatitis B If you have a higher risk for hepatitis B, you should be screened for this virus. Talk with your health care provider to find out if you are at risk for hepatitis B infection. Hepatitis C Testing is recommended for:  Everyone born from 1945 through 1965.  Anyone with known risk factors for hepatitis C. Sexually transmitted infections (STIs)  Get screened for STIs, including gonorrhea and chlamydia, if: ? You are sexually active and are younger than 76 years of age. ? You are older than 76 years of age and your health care provider tells you that you are at risk for this type of infection. ? Your sexual activity has changed since you were last screened, and you are at increased risk for chlamydia or gonorrhea. Ask your health care provider if   you are at risk.  Ask your health care provider about whether you are at high risk for HIV. Your health care provider may recommend a prescription medicine to help prevent HIV infection. If you choose to take medicine to prevent HIV, you should first get tested for HIV. You should then be tested every 3 months for as long as you are taking the medicine. Pregnancy  If you are about to stop having your period (premenopausal) and  you may become pregnant, seek counseling before you get pregnant.  Take 400 to 800 micrograms (mcg) of folic acid every day if you become pregnant.  Ask for birth control (contraception) if you want to prevent pregnancy. Osteoporosis and menopause Osteoporosis is a disease in which the bones lose minerals and strength with aging. This can result in bone fractures. If you are 65 years old or older, or if you are at risk for osteoporosis and fractures, ask your health care provider if you should:  Be screened for bone loss.  Take a calcium or vitamin D supplement to lower your risk of fractures.  Be given hormone replacement therapy (HRT) to treat symptoms of menopause. Follow these instructions at home: Lifestyle  Do not use any products that contain nicotine or tobacco, such as cigarettes, e-cigarettes, and chewing tobacco. If you need help quitting, ask your health care provider.  Do not use street drugs.  Do not share needles.  Ask your health care provider for help if you need support or information about quitting drugs. Alcohol use  Do not drink alcohol if: ? Your health care provider tells you not to drink. ? You are pregnant, may be pregnant, or are planning to become pregnant.  If you drink alcohol: ? Limit how much you use to 0-1 drink a day. ? Limit intake if you are breastfeeding.  Be aware of how much alcohol is in your drink. In the [place] one drink equals one 12 oz bottle of beer (355 mL), one 5 oz glass of wine (148 mL), or one 1 oz glass of hard liquor (44 mL). General instructions  Schedule regular health, dental, and eye exams.  Stay current with your vaccines.  Tell your health care provider if: ? You often feel depressed. ? You have ever been abused or do not feel safe at home. Summary  Adopting a healthy lifestyle and getting preventive care are important in promoting health and wellness.  Follow your health care provider's instructions about healthy  diet, exercising, and getting tested or screened for diseases.  Follow your health care provider's instructions on monitoring your cholesterol and blood pressure. This information is not intended to replace advice given to you by your health care provider. Make sure you discuss any questions you have with your health care provider. Document Released: 11/13/2010 Document Revised: 04/23/2018 Document Reviewed: 04/23/2018 Elsevier Patient Education  2020 [employer]  

## 2018-12-10 NOTE — Assessment & Plan Note (Signed)
Checking lipid panel and adjust as needed.  

## 2018-12-10 NOTE — Assessment & Plan Note (Signed)
Flu shot encouraged. Pneumonia declines. Shingrix declines. Tetanus declines. Colonoscopy up to date due 2023. Mammogram up to date, pap smear aged out and dexa up to date. Counseled about sun safety and mole surveillance. Counseled about the dangers of distracted driving. Given 10 year screening recommendations.

## 2018-12-10 NOTE — Progress Notes (Signed)
   Subjective:   Patient ID: Monica [name], female    DOB: [birth year], 78 y.o.   MRN: [id number]  HPI The patient is a 78 YO female coming in for physical.   PMH, [place], social history reviewed and updated  Review of Systems  Constitutional: Negative.   HENT: Negative.   Eyes: Negative.   Respiratory: Negative for cough, chest tightness and shortness of breath.   Cardiovascular: Negative for chest pain, palpitations and leg swelling.  Gastrointestinal: Negative for abdominal distention, abdominal pain, constipation, diarrhea, nausea and vomiting.  Musculoskeletal: Negative.   Skin: Negative.   Neurological: Negative.   Psychiatric/Behavioral: Negative.     Objective:  Physical Exam Constitutional:      Appearance: She is well-developed.  HENT:     Head: Normocephalic and atraumatic.  Neck:     Musculoskeletal: Normal range of motion.  Cardiovascular:     Rate and Rhythm: Normal rate and regular rhythm.  Pulmonary:     Effort: Pulmonary effort is normal. No respiratory distress.     Breath sounds: Normal breath sounds. No wheezing or rales.  Abdominal:     General: Bowel sounds are normal. There is no distension.     Palpations: Abdomen is soft.     Tenderness: There is no abdominal tenderness. There is no rebound.  Skin:    General: Skin is warm and dry.  Neurological:     Mental Status: She is alert and oriented to person, place, and time.     Coordination: Coordination normal.     Vitals:   12/10/18 0906  BP: 124/74  Pulse: 66  Temp: 97.9 F (36.6 C)  TempSrc: Oral  SpO2: 98%  Weight: 149 lb (67.6 kg)  Height: 5\' 6"  (1.676 m)    Assessment & Plan:

## 2018-12-10 NOTE — Assessment & Plan Note (Signed)
BP at goal on amlodipine 2.5 mg daily. Checking CMP and adjust as needed.  

## 2018-12-10 NOTE — Assessment & Plan Note (Signed)
Checking microalbumin to creatinine urine ratio. Eye exam up to date. Not on meds at this time.

## 2018-12-10 NOTE — Assessment & Plan Note (Signed)
Taking dexilant with good control of symptoms.

## 2018-12-15 ENCOUNTER — Other Ambulatory Visit: Payer: Self-pay | Admitting: Internal Medicine

## 2018-12-15 MED ORDER — ATORVASTATIN CALCIUM 20 MG PO TABS: 20.0000 mg | ORAL_TABLET | Freq: Every day | ORAL | 3 refills | Status: DC

## 2019-01-15 ENCOUNTER — Telehealth: Payer: Self-pay

## 2019-01-15 NOTE — Telephone Encounter (Signed)
Patient informed of MD response and stated understanding  

## 2019-01-15 NOTE — Telephone Encounter (Signed)
Copied from [place] [id number]. Topic: General - Inquiry >> Jan 15, 2019 12:39 PM Percell Belt A wrote: Reason for CRM: pt called and stated that she think the  atorvastatin (LIPITOR) 20 MG tablet [id number] is to strong , it is keeping her constipated.  She is taking phillps stool softer everyday to try to help but it is not helping.  She would like to know know what she needs to do?   Best number [phone]

## 2019-01-15 NOTE — Telephone Encounter (Signed)
Can cut in half to take 1/2 pill daily. This medicine does not typically cause constipation. Any other new meds? Drink plenty of fluid to help and get some exercise also.

## 2019-01-22 ENCOUNTER — Telehealth: Payer: Self-pay | Admitting: Gastroenterology

## 2019-01-22 NOTE — Telephone Encounter (Signed)
Pt states that since her procedure she has not been feeling well, she would like some advise.

## 2019-01-22 NOTE — Telephone Encounter (Signed)
Called patient back, she has only had hard balls of stool since Sat. She has taken Dulcolax daily for a couple of days, prunes, increased her water intake and one dose of senokot today. I suggested she take Miralax, 1 dose this afternoon (since she has already taken dulcolax and senekot this am). And if no results, to take the Miralax 3 times tomorrow and for a couple of days until her bowels return to normal. Asked her to call us back if things don't improve in 2-3 days

## 2019-01-26 ENCOUNTER — Encounter: Payer: Self-pay | Admitting: Internal Medicine

## 2019-01-26 ENCOUNTER — Ambulatory Visit (INDEPENDENT_AMBULATORY_CARE_PROVIDER_SITE_OTHER): Payer: Medicare HMO | Admitting: Internal Medicine

## 2019-01-26 ENCOUNTER — Other Ambulatory Visit: Payer: Self-pay

## 2019-01-26 VITALS — BP 116/84 | HR 87 | Temp 98.6°F | Ht 66.0 in | Wt 150.0 lb

## 2019-01-26 DIAGNOSIS — N644 Mastodynia: Secondary | ICD-10-CM

## 2019-01-26 DIAGNOSIS — K5909 Other constipation: Secondary | ICD-10-CM | POA: Diagnosis not present

## 2019-01-26 DIAGNOSIS — J3089 Other allergic rhinitis: Secondary | ICD-10-CM | POA: Diagnosis not present

## 2019-01-26 NOTE — Patient Instructions (Signed)
You can try claritin or zyrtec over the counter for the feeling in the nose.

## 2019-01-26 NOTE — Progress Notes (Signed)
   Subjective:   Patient ID: Monica [name], female    DOB: [birth year], 78 y.o.   MRN: [id number]  HPI The patient is a 78 YO female coming in for several concerns including constipation (she has tried miralax per GI advice and is finally going again, not sure if she needs to keep taking this, no change in diet recently and did try other laxatives over the counter which did not work, denies blood in stool or dark stools), and left breast heaviness (when leaning over the left breast feels larger, no tenderness or lumps she has felt, denies nipple discharge, denies rash on skin, denies change in size per say), and some feeling of blockage in her nose, worse at night time, has not been outside much recently, denies fevers or chills, denies dripping or coughing, denies SOB).   Review of Systems  Constitutional: Negative.   HENT: Negative.        Nose fullness  Eyes: Negative.   Respiratory: Negative for cough, chest tightness and shortness of breath.   Cardiovascular: Negative for chest pain, palpitations and leg swelling.  Gastrointestinal: Positive for abdominal distention and constipation. Negative for abdominal pain, anal bleeding, blood in stool, diarrhea, nausea and vomiting.  Genitourinary:       Breast heaviness left  Musculoskeletal: Negative.   Skin: Negative.   Neurological: Negative.   Psychiatric/Behavioral: Negative.     Objective:  Physical Exam Constitutional:      Appearance: She is well-developed.  HENT:     Head: Normocephalic and atraumatic.     Right Ear: Tympanic membrane and ear canal normal.     Left Ear: Tympanic membrane and ear canal normal.     Nose: Congestion present.     Mouth/Throat:     Pharynx: No oropharyngeal exudate or posterior oropharyngeal erythema.  Neck:     Musculoskeletal: Normal range of motion.  Cardiovascular:     Rate and Rhythm: Normal rate and regular rhythm.     Comments: Right and left breast exam normal including no axillary LNs  detected Pulmonary:     Effort: Pulmonary effort is normal. No respiratory distress.     Breath sounds: Normal breath sounds. No wheezing or rales.  Abdominal:     General: Bowel sounds are normal. There is no distension.     Palpations: Abdomen is soft.     Tenderness: There is no abdominal tenderness. There is no rebound.  Skin:    General: Skin is warm and dry.  Neurological:     Mental Status: She is alert and oriented to person, place, and time.     Coordination: Coordination normal.     Vitals:   01/26/19 1420  BP: 116/84  Pulse: 87  Temp: 98.6 F (37 C)  TempSrc: Oral  SpO2: 97%  Weight: 150 lb (68 kg)  Height: 5\' 6"  (1.676 m)    Assessment & Plan:

## 2019-01-28 ENCOUNTER — Encounter: Payer: Self-pay | Admitting: Internal Medicine

## 2019-01-28 DIAGNOSIS — N644 Mastodynia: Secondary | ICD-10-CM | POA: Insufficient documentation

## 2019-01-28 DIAGNOSIS — J309 Allergic rhinitis, unspecified: Secondary | ICD-10-CM | POA: Insufficient documentation

## 2019-01-28 NOTE — Assessment & Plan Note (Signed)
Some tenderness with leaning and exam is reassuring. Recent US with mammogram left was normal without cancer. Does not need repeat until next year. Reassurance given.

## 2019-01-28 NOTE — Assessment & Plan Note (Signed)
Advised this is likely due to more mold in the air recently. Can start zyrtec over the counter to help.

## 2019-01-28 NOTE — Assessment & Plan Note (Signed)
Advised miralax daily until regular BM, then can decrease to several times per week as long as she is still having BMs. Talked to her about natural fiber and water intake also. Colonoscopy up to date.

## 2019-02-27 DIAGNOSIS — N950 Postmenopausal bleeding: Secondary | ICD-10-CM | POA: Diagnosis not present

## 2019-03-10 DIAGNOSIS — G4459 Other complicated headache syndrome: Secondary | ICD-10-CM | POA: Diagnosis not present

## 2019-03-10 DIAGNOSIS — H6122 Impacted cerumen, left ear: Secondary | ICD-10-CM | POA: Diagnosis not present

## 2019-03-10 DIAGNOSIS — R438 Other disturbances of smell and taste: Secondary | ICD-10-CM | POA: Diagnosis not present

## 2019-03-10 DIAGNOSIS — H9190 Unspecified hearing loss, unspecified ear: Secondary | ICD-10-CM | POA: Diagnosis not present

## 2019-03-17 ENCOUNTER — Other Ambulatory Visit: Payer: Self-pay | Admitting: Otolaryngology

## 2019-03-17 DIAGNOSIS — G4459 Other complicated headache syndrome: Secondary | ICD-10-CM

## 2019-03-17 DIAGNOSIS — R438 Other disturbances of smell and taste: Secondary | ICD-10-CM

## 2019-03-19 DIAGNOSIS — G4459 Other complicated headache syndrome: Secondary | ICD-10-CM | POA: Diagnosis not present

## 2019-03-19 DIAGNOSIS — R519 Headache, unspecified: Secondary | ICD-10-CM | POA: Diagnosis not present

## 2019-03-19 DIAGNOSIS — R438 Other disturbances of smell and taste: Secondary | ICD-10-CM | POA: Diagnosis not present

## 2019-03-30 DIAGNOSIS — H9113 Presbycusis, bilateral: Secondary | ICD-10-CM | POA: Diagnosis not present

## 2019-03-30 DIAGNOSIS — H903 Sensorineural hearing loss, bilateral: Secondary | ICD-10-CM | POA: Diagnosis not present

## 2019-05-11 DIAGNOSIS — H02052 Trichiasis without entropian right lower eyelid: Secondary | ICD-10-CM | POA: Diagnosis not present

## 2019-05-11 DIAGNOSIS — H16143 Punctate keratitis, bilateral: Secondary | ICD-10-CM | POA: Diagnosis not present

## 2019-06-11 DIAGNOSIS — H401132 Primary open-angle glaucoma, bilateral, moderate stage: Secondary | ICD-10-CM | POA: Diagnosis not present

## 2019-06-12 ENCOUNTER — Other Ambulatory Visit: Payer: Self-pay

## 2019-06-12 ENCOUNTER — Ambulatory Visit (INDEPENDENT_AMBULATORY_CARE_PROVIDER_SITE_OTHER): Payer: Medicare HMO | Admitting: Internal Medicine

## 2019-06-12 ENCOUNTER — Encounter: Payer: Self-pay | Admitting: Internal Medicine

## 2019-06-12 VITALS — BP 132/84 | HR 90 | Temp 98.2°F | Ht 66.0 in | Wt 151.0 lb

## 2019-06-12 DIAGNOSIS — K5909 Other constipation: Secondary | ICD-10-CM

## 2019-06-12 DIAGNOSIS — I10 Essential (primary) hypertension: Secondary | ICD-10-CM

## 2019-06-12 DIAGNOSIS — E119 Type 2 diabetes mellitus without complications: Secondary | ICD-10-CM | POA: Diagnosis not present

## 2019-06-12 LAB — COMPREHENSIVE METABOLIC PANEL
ALT: 16 U/L (ref 0–35)
AST: 19 U/L (ref 0–37)
Albumin: 4.4 g/dL (ref 3.5–5.2)
Alkaline Phosphatase: 57 U/L (ref 39–117)
BUN: 12 mg/dL (ref 6–23)
CO2: 30 mEq/L (ref 19–32)
Calcium: 9.7 mg/dL (ref 8.4–10.5)
Chloride: 104 mEq/L (ref 96–112)
Creatinine, Ser: 0.88 mg/dL (ref 0.40–1.20)
GFR: 75.41 mL/min (ref >60.00)
Glucose, Bld: 134 mg/dL — ABNORMAL HIGH (ref 70–99)
Potassium: 3.8 mEq/L (ref 3.5–5.1)
Sodium: 140 mEq/L (ref 135–145)
Total Bilirubin: 0.8 mg/dL (ref 0.2–1.2)
Total Protein: 7.5 g/dL (ref 6.0–8.3)

## 2019-06-12 LAB — CBC
HCT: 39.9 % (ref 36.0–46.0)
Hemoglobin: 13 g/dL (ref 12.0–15.0)
MCHC: 32.7 g/dL (ref 30.0–36.0)
MCV: 92.8 fl (ref 78.0–100.0)
Platelets: 224 10*3/uL (ref 150.0–400.0)
RBC: 4.3 Mil/uL (ref 3.87–5.11)
RDW: 14.5 % (ref 11.5–15.5)
WBC: 3.3 10*3/uL — ABNORMAL LOW (ref 4.0–10.5)

## 2019-06-12 LAB — HEMOGLOBIN A1C: Hgb A1c MFr Bld: 6.9 % — ABNORMAL HIGH (ref 4.6–6.5)

## 2019-06-12 NOTE — Assessment & Plan Note (Signed)
Overall stable with mild worsening. Miralax is no longer effective. Advised amitiza but she wishes otc first. Advised senokot-d up to 2 BID scheduled for constipation. Plenty of fiber and fluids. Regular exercise helps.

## 2019-06-12 NOTE — Patient Instructions (Signed)
You can try senokot-d over the counter 1 pill twice a day for constipation. If this does not help increase to 2 pills twice a day. If this works too well cut back to 1 pill daily or even every other day.   There is a prescription called amitiza to help with constipation if that does not help.

## 2019-06-12 NOTE — Assessment & Plan Note (Signed)
BP at goal on amlodipine 2.5 mg daily. She does not have dizziness any longer since dose reduction. Checking CMP and adjust as needed.

## 2019-06-12 NOTE — Assessment & Plan Note (Signed)
Checking HgA1c, diet controlled currently. Adjust as needed. Weight stable and diet stable.

## 2019-06-12 NOTE — Progress Notes (Signed)
   Subjective:   Patient ID: Monica [name], female    DOB: 02-10-1941, 79 y.o.   MRN: [id number]  HPI The patient is a 79 YO female coming in for follow up blood pressure (taking amlodipine and denies side effects, BP good at home, denies chest pains or headaches) and diabetes (trying to stay away from starches but has difficulty letting go of sweets, diet controlled, activity level stable, denies new thirst or urination frequency, denies new tingling or numbness in feet/legs/hands) and constipation (tried linzess without relief, takes miralax but lately it is just making her bloated and not helping her constipation, has tried other otc recommendations by pharmacist without relief, chronic issue and overall stable, denies blood in stool).   Review of Systems  Constitutional: Negative.   HENT: Negative.   Eyes: Negative.   Respiratory: Negative for cough, chest tightness and shortness of breath.   Cardiovascular: Negative for chest pain, palpitations and leg swelling.  Gastrointestinal: Positive for constipation. Negative for abdominal distention, abdominal pain, diarrhea, nausea and vomiting.  Musculoskeletal: Negative.   Skin: Negative.   Neurological: Negative.   Psychiatric/Behavioral: Negative.     Objective:  Physical Exam Constitutional:      Appearance: She is well-developed.  HENT:     Head: Normocephalic and atraumatic.  Cardiovascular:     Rate and Rhythm: Normal rate and regular rhythm.  Pulmonary:     Effort: Pulmonary effort is normal. No respiratory distress.     Breath sounds: Normal breath sounds. No wheezing or rales.  Abdominal:     General: Bowel sounds are normal. There is no distension.     Palpations: Abdomen is soft.     Tenderness: There is no abdominal tenderness. There is no rebound.  Musculoskeletal:     Cervical back: Normal range of motion.  Skin:    General: Skin is warm and dry.  Neurological:     Mental Status: She is alert and oriented to  person, place, and time.     Coordination: Coordination normal.     Vitals:   06/12/19 0842  BP: 132/84  Pulse: 90  Temp: 98.2 F (36.8 C)  TempSrc: Oral  SpO2: 99%  Weight: 151 lb (68.5 kg)  Height: 5\' 6"  (1.676 m)    This visit occurred during the SARS-CoV-2 public health emergency.  Safety protocols were in place, including screening questions prior to the visit, additional usage of staff PPE, and extensive cleaning of exam room while observing appropriate contact time as indicated for disinfecting solutions.   Assessment & Plan:

## 2019-06-15 ENCOUNTER — Telehealth: Payer: Self-pay

## 2019-06-15 NOTE — Telephone Encounter (Signed)
Patient returning call to [place] for labs. Please advise.

## 2019-07-04 ENCOUNTER — Ambulatory Visit: Payer: Medicare HMO | Attending: Internal Medicine

## 2019-07-04 DIAGNOSIS — Z23 Encounter for immunization: Secondary | ICD-10-CM | POA: Insufficient documentation

## 2019-07-04 NOTE — Progress Notes (Signed)
   Covid-19 Vaccination Clinic  Name:  Monica [name]    MRN: [id number] DOB: 30-Dec-1940  07/04/2019  Monica [name] was observed post Covid-19 immunization for 15 minutes without incidence. She was provided with Vaccine Information Sheet and instruction to access the V-Safe system.   Monica [name] was instructed to call 911 with any severe reactions post vaccine: [name] Difficulty breathing  . Swelling of your face and throat  . A fast heartbeat  . A bad rash all over your body  . Dizziness and weakness    Immunizations Administered    Name Date Dose VIS Date Route   Pfizer COVID-19 Vaccine 07/04/2019  9:31 AM 0.3 mL 04/24/2019 Intramuscular   Manufacturer: [place]   Lot: [id number]   [place]: [id number]

## 2019-07-06 DIAGNOSIS — N6323 Unspecified lump in the left breast, lower outer quadrant: Secondary | ICD-10-CM | POA: Diagnosis not present

## 2019-07-06 DIAGNOSIS — Z01419 Encounter for gynecological examination (general) (routine) without abnormal findings: Secondary | ICD-10-CM | POA: Diagnosis not present

## 2019-07-06 DIAGNOSIS — D259 Leiomyoma of uterus, unspecified: Secondary | ICD-10-CM | POA: Diagnosis not present

## 2019-07-10 DIAGNOSIS — H2513 Age-related nuclear cataract, bilateral: Secondary | ICD-10-CM | POA: Diagnosis not present

## 2019-07-10 DIAGNOSIS — H1045 Other chronic allergic conjunctivitis: Secondary | ICD-10-CM | POA: Diagnosis not present

## 2019-07-10 DIAGNOSIS — H04123 Dry eye syndrome of bilateral lacrimal glands: Secondary | ICD-10-CM | POA: Diagnosis not present

## 2019-07-10 DIAGNOSIS — H401134 Primary open-angle glaucoma, bilateral, indeterminate stage: Secondary | ICD-10-CM | POA: Diagnosis not present

## 2019-07-24 DIAGNOSIS — H11133 Conjunctival pigmentations, bilateral: Secondary | ICD-10-CM | POA: Diagnosis not present

## 2019-07-24 DIAGNOSIS — H04123 Dry eye syndrome of bilateral lacrimal glands: Secondary | ICD-10-CM | POA: Diagnosis not present

## 2019-07-24 DIAGNOSIS — H2513 Age-related nuclear cataract, bilateral: Secondary | ICD-10-CM | POA: Diagnosis not present

## 2019-07-24 DIAGNOSIS — H18413 Arcus senilis, bilateral: Secondary | ICD-10-CM | POA: Diagnosis not present

## 2019-07-24 DIAGNOSIS — H16223 Keratoconjunctivitis sicca, not specified as Sjogren's, bilateral: Secondary | ICD-10-CM | POA: Diagnosis not present

## 2019-07-24 DIAGNOSIS — H11153 Pinguecula, bilateral: Secondary | ICD-10-CM | POA: Diagnosis not present

## 2019-07-24 DIAGNOSIS — H1045 Other chronic allergic conjunctivitis: Secondary | ICD-10-CM | POA: Diagnosis not present

## 2019-07-27 ENCOUNTER — Ambulatory Visit: Payer: Medicare HMO | Attending: Internal Medicine

## 2019-07-27 DIAGNOSIS — Z23 Encounter for immunization: Secondary | ICD-10-CM

## 2019-07-27 NOTE — Progress Notes (Signed)
   Covid-19 Vaccination Clinic  Name:  Monica [name]    MRN: [id number] DOB: 1941/05/07  07/27/2019  [name] was observed post Covid-19 immunization for  without incident. She was provided with Vaccine Information Sheet and instruction to access the V-Safe system.   [name] was instructed to call 911 with any severe reactions post vaccine: [name] Difficulty breathing  . Swelling of face and throat  . A fast heartbeat  . A bad rash all over body  . Dizziness and weakness   Immunizations Administered    Name Date Dose VIS Date Route   Pfizer COVID-19 Vaccine 07/27/2019  8:23 AM 0.3 mL 04/24/2019 Intramuscular   Manufacturer: [place]   Lot: [id number]   [place]: [id number]

## 2019-08-05 ENCOUNTER — Encounter: Payer: Self-pay | Admitting: Gastroenterology

## 2019-08-05 ENCOUNTER — Ambulatory Visit: Payer: Medicare HMO | Admitting: Gastroenterology

## 2019-08-05 VITALS — BP 122/70 | HR 84 | Temp 98.3°F | Ht 66.0 in | Wt 152.2 lb

## 2019-08-05 DIAGNOSIS — Z86010 Personal history of colonic polyps: Secondary | ICD-10-CM

## 2019-08-05 DIAGNOSIS — K219 Gastro-esophageal reflux disease without esophagitis: Secondary | ICD-10-CM | POA: Diagnosis not present

## 2019-08-05 DIAGNOSIS — K5909 Other constipation: Secondary | ICD-10-CM

## 2019-08-05 MED ORDER — OMEPRAZOLE 40 MG PO CPDR: 40.0000 mg | DELAYED_RELEASE_CAPSULE | Freq: Every day | ORAL | 1 refills | Status: DC

## 2019-08-05 NOTE — Patient Instructions (Addendum)
If you are age 77 or older, your body mass index should be between 23-30. Your Body mass index is 24.57 kg/m. If this is out of the aforementioned range listed, please consider follow up with your Primary Care Provider.  If you are age 7 or younger, your body mass index should be between 19-25. Your Body mass index is 24.57 kg/m. If this is out of the aformentioned range listed, please consider follow up with your Primary Care Provider.   Continue Miralax.  Discontinue Dexilant.  We have sent the following medications to your pharmacy for you to pick up at your convenience: Start Omeprazole 40mg : Take once daily    Please follow up in 1 year.  Thank you for entrusting me with your care and for choosing [facility], Dr. Montague Cellar

## 2019-08-05 NOTE — Progress Notes (Signed)
HPI :  79 year old female here for a follow-up visit for constipation.  I have seen her in the past for constipation, history of colon polyps, and reflux.  She states she continues to be constipated if she does not take anything for her bowels.  She had been on MiraLAX previously but wanted to try something else on her last visit.  I gave her a trial of Linzess but she thinks it was too harsh on her system and did not like the way it made her feel.  She asked about other options.  She is currently taking MiraLAX on most days and this tends to work well for her.  She has 1-2 bowel movements a day.  She has some borborygmi with this but no abdominal pain.  At her previous visit we discussed her long-term Dexilant use.  It works quite well to control her reflux but I wonder if this is too strong for her.  She has been on some other regimens in the past which may not have worked too well but cannot remember the names of them.  At the last visit we tried to transition her to omeprazole due to cost and try to wean her down to a lower dose.  She cannot recall if she ever tried this.  Looks like she is continue to take Dexilant 60 mg a day.  She does not have any history of Barrett's esophagus on prior endoscopies.  Her last colonoscopy with me was in June 2020, she had 4 small adenomas removed at that time and internal hemorrhoids.  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  Colonoscopy 10/29/18 - The perianal and digital rectal examinations were normal. - A 3 mm polyp was found in the cecum. The polyp was sessile. The polyp was removed with a cold snare. Resection and retrieval were complete. - A 2 mm polyp was found in the transverse colon. The polyp was  sessile. The polyp was removed with a cold snare. Resection and retrieval were complete. - A 2 mm polyp was found in the descending colon. The polyp was sessile. The polyp was removed with a cold snare. Resection and retrieval were complete. - A 4 to 5 mm polyp was found in the sigmoid colon. The polyp was sessile. The polyp was removed with a cold snare. Resection and retrieval were complete. - Internal hemorrhoids were found during retroflexion, with suspected stigmata of prior banding noted. - The exam was otherwise without abnormality.  Path shows TAs   Past Medical History:  Diagnosis Date   ANXIETY    Arthritis    Chronic constipation    Colon polyp 2011   tubulovillous adenoma   DEPRESSION, SITUATIONAL    Diabetes mellitus ([place]) dx 02/2013   GERD (gastroesophageal reflux disease)    w/ HH (EGD 2010)   GLAUCOMA    Hypertension    Iron deficiency anemia due to chronic blood loss 02/05/2014   LOW BACK PAIN, CHRONIC    Mixed hyperlipidemia    OSTEOPENIA    Stricture and stenosis of esophagus 2006     Past Surgical History:  Procedure Laterality Date   BACK SURGERY     COLONOSCOPY  09/21/2013   [name]   ESOPHAGEAL MANOMETRY N/A 02/09/2013   Procedure: ESOPHAGEAL MANOMETRY (EM);  Surgeon: Sable Feil, MD;  Location: [facility];  Service: Endoscopy;  Laterality: N/A;   EYE SURGERY     frozen shoulder surgery Left    HEMORROIDECTOMY     HYSTEROSCOPY WITH D & C  07/20/2011   Procedure: DILATATION AND CURETTAGE /HYSTEROSCOPY;  Surgeon: Melina Schools, MD;  Location: [facility];  Service: Gynecology;  Laterality: N/A;   POLYPECTOMY  07/20/2011   Procedure: POLYPECTOMY;  Surgeon: Melina Schools, MD;  Location: [facility];  Service: Gynecology;  Laterality: N/A;   POLYPECTOMY     ROTATOR CUFF REPAIR     right    TUBAL LIGATION     Family History  Problem Relation Age of Onset   Heart failure Father    Diabetes Mother    Breast cancer Sister      Colon cancer Sister 93   Diabetes Brother    Esophageal cancer Neg Hx    Rectal cancer Neg Hx    Stomach cancer Neg Hx    Colon polyps Neg Hx    Social History   Tobacco Use   Smoking status: Never Smoker   Smokeless tobacco: Never Used  Substance Use Topics   Alcohol use: No    Alcohol/week: 0.0 standard drinks   Drug use: No   Current Outpatient Medications  Medication Sig Dispense Refill   amLODipine (NORVASC) 5 MG tablet Take 0.5 tablets (2.5 mg total) by mouth daily. 45 tablet 3   aspirin 81 MG tablet Take 81 mg by mouth daily.       atorvastatin (LIPITOR) 20 MG tablet Take 1 tablet (20 mg total) by mouth daily. 90 tablet 3   bisacodyl (DULCOLAX) 5 MG EC tablet Take 5 mg by mouth daily as needed for moderate constipation.     dexlansoprazole (DEXILANT) 60 MG capsule Take 1 capsule (60 mg total) by mouth daily. 90 capsule 3   latanoprost (XALATAN) 0.005 % ophthalmic solution      meloxicam (MOBIC) 7.5 MG tablet Take 1 tablet (7.5 mg total) by mouth daily. 90 tablet 3   Polyethylene Glycol 3350 (MIRALAX PO) Take 17 g by mouth daily.     No current facility-administered medications for this visit.   Allergies  Allergen Reactions   Codeine     REACTION: Upset stomach   Hydrochlorothiazide     REACTION: Nausea and headache     Review of Systems: All systems reviewed and negative except where noted in HPI.   Lab Results  Component Value Date   WBC 3.3 (L) 06/12/2019   HGB 13.0 06/12/2019   HCT 39.9 06/12/2019   MCV 92.8 06/12/2019   PLT 224.0 06/12/2019    Lab Results  Component Value Date   CREATININE 0.88 06/12/2019   BUN 12 06/12/2019   NA 140 06/12/2019   K 3.8 06/12/2019   CL 104 06/12/2019   CO2 30 06/12/2019    Lab Results  Component Value Date   ALT 16 06/12/2019   AST 19 06/12/2019   ALKPHOS 57 06/12/2019   BILITOT 0.8 06/12/2019     Physical Exam: BP 122/70    Pulse 84    Temp 98.3 F (36.8 C)    Ht 5\' 6"  (1.676  m)    Wt 152 lb 3.2 oz (69 kg)    BMI 24.57 kg/m  Constitutional: Pleasant,well-developed, female in no acute distress. Neurological: Alert and oriented to person place and time. Psychiatric: Normal mood and affect. Behavior is normal.   ASSESSMENT AND  PLAN: 79 year old female here for reassessment of the following:  Constipation - colonoscopy screening is up-to-date.  She responds well to MiraLAX and this works for her.  We discussed options moving forward.  I think given that MiraLAX is effective, it safe, and it is cheap, I would continue this regimen, especially with failure of some other options in the past.  Linzess was far too aggressive for her.  After this discussion she is agreeable to continue MiraLAX as currently scheduled and can follow-up with me as needed if this does not work for her or if she wishes to change regimens in the future for some reason.  GERD - taking high-dose Dexilant over time.  I discussed risks of chronic PPIs with her to include chronic kidney disease, bone fracture, vitamin deficiencies, etc.  I do think we should try to lower her dose and switch her to another regimen if possible that may be cheaper.  I again recommend we transition to omeprazole 40 mg once a day and see how that works for her.  If she does well in this we can consider titrating down her dose otherwise.  She was agreeable to this.  If for some reason this does not work well and she has significant flare of symptoms we can always go back to the [place].  She agreed  History of colon polyps - 4 small adenomas on her last exam, she would next be due for surveillance in 3 to 5 years from her last exam.  I do not feel strongly she warrants any further surveillance exams given she will be between 4-53 years old at that time.  She agreed and can let me know if she feels differently otherwise moving forward.  [place] Cellar, MD [facility]

## 2019-08-31 ENCOUNTER — Other Ambulatory Visit: Payer: Self-pay

## 2019-08-31 ENCOUNTER — Encounter: Payer: Self-pay | Admitting: Internal Medicine

## 2019-08-31 ENCOUNTER — Ambulatory Visit (INDEPENDENT_AMBULATORY_CARE_PROVIDER_SITE_OTHER): Payer: Medicare HMO | Admitting: Internal Medicine

## 2019-08-31 VITALS — BP 136/82 | HR 84 | Temp 99.1°F | Ht 66.0 in | Wt 149.0 lb

## 2019-08-31 DIAGNOSIS — H409 Unspecified glaucoma: Secondary | ICD-10-CM | POA: Diagnosis not present

## 2019-08-31 DIAGNOSIS — J3089 Other allergic rhinitis: Secondary | ICD-10-CM

## 2019-08-31 DIAGNOSIS — H5310 Unspecified subjective visual disturbances: Secondary | ICD-10-CM | POA: Diagnosis not present

## 2019-08-31 NOTE — Assessment & Plan Note (Signed)
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

## 2019-08-31 NOTE — Assessment & Plan Note (Signed)
Given her glaucoma we will not use flonase as this can worsen glaucoma pressures. She was also asking if it is time to fix her cataracts. She was given some information about this and advised to call her eye doctor for sooner appointment than June to discuss.

## 2019-08-31 NOTE — Patient Instructions (Addendum)
It is okay to use saline or lubricating eye drops as often as needed for the eye sensation.   It is okay to take claritin or zyrtec over the counter for the allergies. The lump on the neck is likely from allergies and should go away in 1-2 weeks. Let us know if it stays or gets bigger instead.

## 2019-08-31 NOTE — Progress Notes (Signed)
   Subjective:   Patient ID: Monica [name], female    DOB: 1941-03-05, 79 y.o.   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 like her eyes are tired, like she has been crying a lot, has talked to her eye doctor about it in the past and they gave her patanol eye drops which she uses in the morning, sometimes these help and sometimes they do not) and lump on her neck (noticed Friday with a tender swollen spot, having a lot of allergy drainage and dripping lately, has not tried anything, denies fevers or chills, denies cough or SOB, overall now not tender and maybe shrinking a little, no ear pain) and cataracts/glaucoma (not sure if this is causing her eye problem, has visit with eye specialist in June, no problems with reading fine print, eyes feel like she has been crying, does a lot of word searches).  Review of Systems  Constitutional: Negative.   HENT: Positive for congestion and postnasal drip. Negative for ear discharge, ear pain, sinus pressure, sinus pain, sore throat, trouble swallowing and voice change.   Eyes: Positive for visual disturbance.  Respiratory: Negative for cough, chest tightness and shortness of breath.   Cardiovascular: Negative for chest pain, palpitations and leg swelling.  Gastrointestinal: Negative for abdominal distention, abdominal pain, constipation, diarrhea, nausea and vomiting.  Musculoskeletal: Negative.   Skin: Negative.   Neurological: Negative.   Psychiatric/Behavioral: Negative.     Objective:  Physical Exam Constitutional:      Appearance: She is well-developed.  HENT:     Head: Normocephalic and atraumatic.     Right Ear: Tympanic membrane normal.     Left Ear: Tympanic membrane normal.     Mouth/Throat:     Comments: Clear drainage with bilateral redness oropharynx Neck:     Comments: Mild shotty LAD left neck paratracheal  without worrisome features Cardiovascular:     Rate and Rhythm: Normal rate and regular rhythm.  Pulmonary:     Effort: Pulmonary effort is normal. No respiratory distress.     Breath sounds: Normal breath sounds. No wheezing or rales.  Abdominal:     General: Bowel sounds are normal. There is no distension.     Palpations: Abdomen is soft.     Tenderness: There is no abdominal tenderness. There is no rebound.  Musculoskeletal:     Cervical back: Normal range of motion.  Skin:    General: Skin is warm and dry.  Neurological:     Mental Status: She is alert and oriented to person, place, and time.     Coordination: Coordination normal.     Vitals:   08/31/19 1315  BP: 136/82  Pulse: 84  Temp: 99.1 F (37.3 C)  SpO2: 98%  Weight: 149 lb (67.6 kg)  Height: 5\' 6"  (1.676 m)    This visit occurred during the SARS-CoV-2 public health emergency.  Safety protocols were in place, including screening questions prior to the visit, additional usage of staff PPE, and extensive cleaning of exam room while observing appropriate contact time as indicated for disinfecting solutions.   Assessment & Plan:

## 2019-08-31 NOTE — Assessment & Plan Note (Addendum)
Not taking anything currently. Advised claritin or zyrtec otc for symptoms. Since she has concurrent glaucoma nasal glucocorticoids are not a good option. Lump left neck likely LN. If growing or not resolving call back and we can order [place] but benign feeling on exam today.

## 2019-09-25 ENCOUNTER — Ambulatory Visit: Payer: Medicare HMO

## 2019-09-28 ENCOUNTER — Other Ambulatory Visit: Payer: Self-pay

## 2019-09-28 ENCOUNTER — Ambulatory Visit: Payer: Medicare HMO | Admitting: Internal Medicine

## 2019-10-07 DIAGNOSIS — K648 Other hemorrhoids: Secondary | ICD-10-CM | POA: Diagnosis not present

## 2019-11-09 DIAGNOSIS — H524 Presbyopia: Secondary | ICD-10-CM | POA: Diagnosis not present

## 2019-12-15 ENCOUNTER — Encounter: Payer: Self-pay | Admitting: Internal Medicine

## 2019-12-15 ENCOUNTER — Other Ambulatory Visit: Payer: Self-pay

## 2019-12-15 ENCOUNTER — Encounter: Payer: Medicare HMO | Admitting: Internal Medicine

## 2019-12-15 ENCOUNTER — Ambulatory Visit (INDEPENDENT_AMBULATORY_CARE_PROVIDER_SITE_OTHER): Payer: Medicare HMO | Admitting: Internal Medicine

## 2019-12-15 ENCOUNTER — Telehealth: Payer: Self-pay

## 2019-12-15 VITALS — BP 132/82 | HR 83 | Temp 98.7°F | Ht 66.0 in | Wt 148.0 lb

## 2019-12-15 DIAGNOSIS — K219 Gastro-esophageal reflux disease without esophagitis: Secondary | ICD-10-CM | POA: Diagnosis not present

## 2019-12-15 DIAGNOSIS — E785 Hyperlipidemia, unspecified: Secondary | ICD-10-CM

## 2019-12-15 DIAGNOSIS — E119 Type 2 diabetes mellitus without complications: Secondary | ICD-10-CM

## 2019-12-15 DIAGNOSIS — Z0000 Encounter for general adult medical examination without abnormal findings: Secondary | ICD-10-CM | POA: Diagnosis not present

## 2019-12-15 DIAGNOSIS — E1169 Type 2 diabetes mellitus with other specified complication: Secondary | ICD-10-CM | POA: Diagnosis not present

## 2019-12-15 DIAGNOSIS — I10 Essential (primary) hypertension: Secondary | ICD-10-CM | POA: Diagnosis not present

## 2019-12-15 MED ORDER — DEXILANT 60 MG PO CPDR: 60.0000 mg | DELAYED_RELEASE_CAPSULE | Freq: Every day | ORAL | 3 refills | Status: DC

## 2019-12-15 NOTE — Progress Notes (Signed)
Subjective:   Patient ID: Monica [name], female    DOB: November 04, 1940, 79 y.o.   MRN: [id number]  HPI Here for medicare wellness and physical, no new complaints. Please see A/P for status and treatment of chronic medical problems.   Dr. Idolina Primer for eyes  Diet: DM since diabetic Physical activity: sedentary Depression/mood screen: negative Hearing: intact to whispered voice left, right with moderate loss Visual acuity: grossly normal, performs annual eye exam  ADLs: capable Fall risk: none Home safety: good Cognitive evaluation: intact to orientation, naming, recall and repetition EOL planning: adv directives discussed    Clinical Support from 09/19/2018 in [place]  PHQ-2 Total Score 0        Clinical Support from 09/19/2018 in [place]  PHQ-9 Total Score 0      I have personally reviewed and have noted 1. The patient's medical and social history - reviewed today no changes 2. Their use of alcohol, tobacco or illicit drugs 3. Their current medications and supplements 4. The patient's functional ability including ADL's, fall risks, home safety risks and hearing or visual impairment. 5. Diet and physical activities 6. Evidence for depression or mood disorders 7. Care team reviewed and updated  Patient Care Team: [name], MD as PCP - General (Internal Medicine) Newton Pigg, MD (Obstetrics and Gynecology) Katy Apo, MD (Ophthalmology) Sable Feil, MD as Consulting Physician (Gastroenterology) Past Medical History:  Diagnosis Date  . ANXIETY   . Arthritis   . Chronic constipation   . 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 DEPRESSION, SITUATIONAL   . Diabetes mellitus ([place]) dx 02/2013  . GERD (gastroesophageal reflux disease)    w/ HH (EGD 2010)  . GLAUCOMA   . Hypertension   . Iron deficiency anemia due to chronic blood loss 02/05/2014  . LOW BACK PAIN, CHRONIC   . Mixed  hyperlipidemia   . OSTEOPENIA   . Stricture and stenosis of esophagus 2006   Past Surgical History:  Procedure Laterality Date  . BACK SURGERY    . COLONOSCOPY  09/21/2013   [name]  . ESOPHAGEAL MANOMETRY N/A 02/09/2013   Procedure: ESOPHAGEAL MANOMETRY (EM);  Surgeon: Sable Feil, MD;  Location: [facility];  Service: Endoscopy;  Laterality: N/A;  . EYE SURGERY    . frozen shoulder surgery Left   . HEMORROIDECTOMY    . HYSTEROSCOPY WITH D & C  07/20/2011   Procedure: DILATATION AND CURETTAGE /HYSTEROSCOPY;  Surgeon: Melina Schools, MD;  Location: [facility];  Service: Gynecology;  Laterality: N/A;  . POLYPECTOMY  07/20/2011   Procedure: POLYPECTOMY;  Surgeon: Melina Schools, MD;  Location: [facility];  Service: Gynecology;  Laterality: N/A;  . POLYPECTOMY    . ROTATOR CUFF REPAIR     right   . TUBAL LIGATION     Family History  Problem Relation Age of Onset  . Heart failure Father   . Diabetes Mother   . Breast cancer Sister   . Colon cancer Sister 45  . Diabetes Brother   . Esophageal cancer Neg Hx   . Rectal cancer Neg Hx   . Stomach cancer Neg Hx   . Colon polyps Neg Hx    Review of Systems  Constitutional: Negative.   HENT: Negative.   Eyes: Negative.   Respiratory: Negative for cough, chest tightness and shortness of breath.   Cardiovascular: Negative for chest pain, palpitations and leg swelling.  Gastrointestinal: Negative  for abdominal distention, abdominal pain, constipation, diarrhea, nausea and vomiting.  Musculoskeletal: Negative.   Skin: Negative.   Neurological: Negative.   Psychiatric/Behavioral: Negative.     Objective:  Physical Exam Constitutional:      Appearance: She is well-developed.  HENT:     Head: Normocephalic and atraumatic.  Cardiovascular:     Rate and Rhythm: Normal rate and regular rhythm.  Pulmonary:     Effort: Pulmonary effort is normal. No respiratory distress.     Breath sounds: Normal breath sounds. No wheezing or rales.   Abdominal:     General: Bowel sounds are normal. There is no distension.     Palpations: Abdomen is soft.     Tenderness: There is no abdominal tenderness. There is no rebound.  Musculoskeletal:     Cervical back: Normal range of motion.  Skin:    General: Skin is warm and dry.  Neurological:     Mental Status: She is alert and oriented to person, place, and time.     Coordination: Coordination normal.     Vitals:   12/15/19 1255  BP: 132/82  Pulse: 83  Temp: 98.7 F (37.1 C)  TempSrc: Oral  SpO2: 97%  Weight: 148 lb (67.1 kg)  Height: 5\' 6"  (1.676 m)    This visit occurred during the SARS-CoV-2 public health emergency.  Safety protocols were in place, including screening questions prior to the visit, additional usage of staff PPE, and extensive cleaning of exam room while observing appropriate contact time as indicated for disinfecting solutions.   Assessment & Plan:

## 2019-12-15 NOTE — Telephone Encounter (Signed)
Pt's daughter, Guerry Minors, wanted PCP to fill out disability forms from [employer] for pt to receive a medical wavier to receive compensation for time out of work due to injury on the job that resulted in back surgery. PCP suggested that the forms be filled out by the physician that took the pt out of work or the Psychologist, sport and exercise. Once pt's daughter was informed of PCPs response she stated that the pt did not know the physicians name and stated that he was either retired or had passed. I browsed through the pt's chart and could not find any record of a surgery pertaining to the injury to the back. The patients daughter wanted to me to look in the chart for the new patient paperwork packet that was filled out from establishing care stating "That should have the information listed on it" but I could not locate that paperwork in the media tab or any other information that could have been scanned pertaining to the requested form to be filled out. I suggested that possibly reaching out to the practice that it was done at but pt and the pt's daughter didn't have that information either but mentioned they knew the surgery was at a [facility] facility. At this time I transferred the call to the clinical manager, Garnette Czech, for some insight on how they could possibly get the information needed.

## 2019-12-15 NOTE — Patient Instructions (Signed)
Neck Exercises Ask your health care provider which exercises are safe for you. Do exercises exactly as told by your health care provider and adjust them as directed. It is normal to feel mild stretching, pulling, tightness, or discomfort as you do these exercises. Stop right away if you feel sudden pain or your pain gets worse. Do not begin these exercises until told by your health care provider. Neck exercises can be important for many reasons. They can improve strength and maintain flexibility in your neck, which will help your upper back and prevent neck pain. Stretching exercises Rotation neck stretching  1. Sit in a chair or stand up. 2. Place your feet flat on the floor, shoulder width apart. 3. Slowly turn your head (rotate) to the right until a slight stretch is felt. Turn it all the way to the right so you can look over your right shoulder. Do not tilt or tip your head. 4. Hold this position for 10-30 seconds. 5. Slowly turn your head (rotate) to the left until a slight stretch is felt. Turn it all the way to the left so you can look over your left shoulder. Do not tilt or tip your head. 6. Hold this position for 10-30 seconds. Repeat __________ times. Complete this exercise __________ times a day. Neck retraction 1. Sit in a sturdy chair or stand up. 2. Look straight ahead. Do not bend your neck. 3. Use your fingers to push your chin backward (retraction). Do not bend your neck for this movement. Continue to face straight ahead. If you are doing the exercise properly, you will feel a slight sensation in your throat and a stretch at the back of your neck. 4. Hold the stretch for 1-2 seconds. Repeat __________ times. Complete this exercise __________ times a day. Strengthening exercises Neck press 1. Lie on your back on a firm bed or on the floor with a pillow under your head. 2. Use your neck muscles to push your head down on the pillow and straighten your spine. 3. Hold the position  as well as you can. Keep your head facing up (in a neutral position) and your chin tucked. 4. Slowly count to 5 while holding this position. Repeat __________ times. Complete this exercise __________ times a day. Isometrics These are exercises in which you strengthen the muscles in your neck while keeping your neck still (isometrics). 1. Sit in a supportive chair and place your hand on your forehead. 2. Keep your head and face facing straight ahead. Do not flex or extend your neck while doing isometrics. 3. Push forward with your head and neck while pushing back with your hand. Hold for 10 seconds. 4. Do the sequence again, this time putting your hand against the back of your head. Use your head and neck to push backward against the hand pressure. 5. Finally, do the same exercise on either side of your head, pushing sideways against the pressure of your hand. Repeat __________ times. Complete this exercise __________ times a day. Prone head lifts 1. Lie face-down (prone position), resting on your elbows so that your chest and upper back are raised. 2. Start with your head facing downward, near your chest. Position your chin either on or near your chest. 3. Slowly lift your head upward. Lift until you are looking straight ahead. Then continue lifting your head as far back as you can comfortably stretch. 4. Hold your head up for 5 seconds. Then slowly lower it to your starting position. Repeat __________   times. Complete this exercise __________ times a day. Supine head lifts 1. Lie on your back (supine position), bending your knees to point to the ceiling and keeping your feet flat on the floor. 2. Lift your head slowly off the floor, raising your chin toward your chest. 3. Hold for 5 seconds. Repeat __________ times. Complete this exercise __________ times a day. Scapular retraction 1. Stand with your arms at your sides. Look straight ahead. 2. Slowly pull both shoulders (scapulae) backward  and downward (retraction) until you feel a stretch between your shoulder blades in your upper back. 3. Hold for 10-30 seconds. 4. Relax and repeat. Repeat __________ times. Complete this exercise __________ times a day. Contact a health care provider if:  Your neck pain or discomfort gets much worse when you do an exercise.  Your neck pain or discomfort does not improve within 2 hours after you exercise. If you have any of these problems, stop exercising right away. Do not do the exercises again unless your health care provider says that you can. Get help right away if:  You develop sudden, severe neck pain. If this happens, stop exercising right away. Do not do the exercises again unless your health care provider says that you can. This information is not intended to replace advice given to you by your health care provider. Make sure you discuss any questions you have with your health care provider. Document Revised: 02/26/2018 Document Reviewed: 02/26/2018 Elsevier Patient Education  [place] Maintenance, Female Adopting a healthy lifestyle and getting preventive care are important in promoting health and wellness. Ask your health care provider about:  The right schedule for you to have regular tests and exams.  Things you can do on your own to prevent diseases and keep yourself healthy. What should I know about diet, weight, and exercise? Eat a healthy diet   Eat a diet that includes plenty of vegetables, fruits, low-fat dairy products, and lean protein.  Do not eat a lot of foods that are high in solid fats, added sugars, or sodium. Maintain a healthy weight Body mass index (BMI) is used to identify weight problems. It estimates body fat based on height and weight. Your health care provider can help determine your BMI and help you achieve or maintain a healthy weight. Get regular exercise Get regular exercise. This is one of the most important things you can  do for your health. Most adults should:  Exercise for at least 150 minutes each week. The exercise should increase your heart rate and make you sweat (moderate-intensity exercise).  Do strengthening exercises at least twice a week. This is in addition to the moderate-intensity exercise.  Spend less time sitting. Even light physical activity can be beneficial. Watch cholesterol and blood lipids Have your blood tested for lipids and cholesterol at 76 years of age, then have this test every 5 years. Have your cholesterol levels checked more often if:  Your lipid or cholesterol levels are high.  You are older than 77 years of age.  You are at high risk for heart disease. What should I know about cancer screening? Depending on your health history and family history, you may need to have cancer screening at various ages. This may include screening for:  Breast cancer.  Cervical cancer.  Colorectal cancer.  Skin cancer.  Lung cancer. What should I know about heart disease, diabetes, and high blood pressure? Blood pressure and heart disease  High blood pressure causes heart  disease and increases the risk of stroke. This is more likely to develop in people who have high blood pressure readings, are of African descent, or are overweight.  Have your blood pressure checked: ? Every 3-5 years if you are 37-49 years of age. ? Every year if you are 69 years old or older. Diabetes Have regular diabetes screenings. This checks your fasting blood sugar level. Have the screening done:  Once every three years after age 94 if you are at a normal weight and have a low risk for diabetes.  More often and at a younger age if you are overweight or have a high risk for diabetes. What should I know about preventing infection? Hepatitis B If you have a higher risk for hepatitis B, you should be screened for this virus. Talk with your health care provider to find out if you are at risk for hepatitis B  infection. Hepatitis C Testing is recommended for:  Everyone born from [birth year] through 1963.  Anyone with known risk factors for hepatitis C. Sexually transmitted infections (STIs)  Get screened for STIs, including gonorrhea and chlamydia, if: ? You are sexually active and are younger than 77 years of age. ? You are older than 77 years of age and your health care provider tells you that you are at risk for this type of infection. ? Your sexual activity has changed since you were last screened, and you are at increased risk for chlamydia or gonorrhea. Ask your health care provider if you are at risk.  Ask your health care provider about whether you are at high risk for HIV. Your health care provider may recommend a prescription medicine to help prevent HIV infection. If you choose to take medicine to prevent HIV, you should first get tested for HIV. You should then be tested every 3 months for as long as you are taking the medicine. Pregnancy  If you are about to stop having your period (premenopausal) and you may become pregnant, seek counseling before you get pregnant.  Take 400 to 800 micrograms (mcg) of folic acid every day if you become pregnant.  Ask for birth control (contraception) if you want to prevent pregnancy. Osteoporosis and menopause Osteoporosis is a disease in which the bones lose minerals and strength with aging. This can result in bone fractures. If you are 49 years old or older, or if you are at risk for osteoporosis and fractures, ask your health care provider if you should:  Be screened for bone loss.  Take a calcium or vitamin D supplement to lower your risk of fractures.  Be given hormone replacement therapy (HRT) to treat symptoms of menopause. Follow these instructions at home: Lifestyle  Do not use any products that contain nicotine or tobacco, such as cigarettes, e-cigarettes, and chewing tobacco. If you need help quitting, ask your health care  provider.  Do not use street drugs.  Do not share needles.  Ask your health care provider for help if you need support or information about quitting drugs. Alcohol use  Do not drink alcohol if: ? Your health care provider tells you not to drink. ? You are pregnant, may be pregnant, or are planning to become pregnant.  If you drink alcohol: ? Limit how much you use to 0-1 drink a day. ? Limit intake if you are breastfeeding.  Be aware of how much alcohol is in your drink. In the [place] one drink equals one 12 oz bottle of beer (355 mL), one  5 oz glass of wine (148 mL), or one 1 oz glass of hard liquor (44 mL). General instructions  Schedule regular health, dental, and eye exams.  Stay current with your vaccines.  Tell your health care provider if: ? You often feel depressed. ? You have ever been abused or do not feel safe at home. Summary  Adopting a healthy lifestyle and getting preventive care are important in promoting health and wellness.  Follow your health care provider's instructions about healthy diet, exercising, and getting tested or screened for diseases.  Follow your health care provider's instructions on monitoring your cholesterol and blood pressure. This information is not intended to replace advice given to you by your health care provider. Make sure you discuss any questions you have with your health care provider. Document Revised: 04/23/2018 Document Reviewed: 04/23/2018 Elsevier Patient Education  2020 [employer].

## 2019-12-16 LAB — CBC
HCT: 42.5 % (ref 35.0–45.0)
Hemoglobin: 13.5 g/dL (ref 11.7–15.5)
MCH: 29.8 pg (ref 27.0–33.0)
MCHC: 31.8 g/dL — ABNORMAL LOW (ref 32.0–36.0)
MCV: 93.8 fL (ref 80.0–100.0)
MPV: 11.1 fL (ref 7.5–12.5)
Platelets: 242 10*3/uL (ref 140–400)
RBC: 4.53 10*6/uL (ref 3.80–5.10)
RDW: 13.3 % (ref 11.0–15.0)
WBC: 5.6 10*3/uL (ref 3.8–10.8)

## 2019-12-16 LAB — MICROALBUMIN / CREATININE URINE RATIO
Creatinine, Urine: 175 mg/dL (ref 20–275)
Microalb Creat Ratio: 7 mcg/mg creat (ref <30)
Microalb, Ur: 1.2 mg/dL

## 2019-12-16 LAB — COMPREHENSIVE METABOLIC PANEL
AG Ratio: 1.6 (calc) (ref 1.0–2.5)
ALT: 14 U/L (ref 6–29)
AST: 20 U/L (ref 10–35)
Albumin: 4.7 g/dL (ref 3.6–5.1)
Alkaline phosphatase (APISO): 58 U/L (ref 37–153)
BUN/Creatinine Ratio: 18 (calc) (ref 6–22)
BUN: 17 mg/dL (ref 7–25)
CO2: 29 mmol/L (ref 20–32)
Calcium: 10 mg/dL (ref 8.6–10.4)
Chloride: 106 mmol/L (ref 98–110)
Creat: 0.96 mg/dL — ABNORMAL HIGH (ref 0.60–0.93)
Globulin: 2.9 g/dL (calc) (ref 1.9–3.7)
Glucose, Bld: 96 mg/dL (ref 65–99)
Potassium: 4.5 mmol/L (ref 3.5–5.3)
Sodium: 141 mmol/L (ref 135–146)
Total Bilirubin: 0.8 mg/dL (ref 0.2–1.2)
Total Protein: 7.6 g/dL (ref 6.1–8.1)

## 2019-12-16 LAB — HEMOGLOBIN A1C
Hgb A1c MFr Bld: 6.7 % of total Hgb — ABNORMAL HIGH (ref <5.7)
Mean Plasma Glucose: 146 (calc)
eAG (mmol/L): 8.1 (calc)

## 2019-12-16 LAB — LIPID PANEL
Cholesterol: 138 mg/dL (ref <200)
HDL: 58 mg/dL (ref >=50)
LDL Cholesterol (Calc): 62 mg/dL (calc)
Non-HDL Cholesterol (Calc): 80 mg/dL (calc) (ref <130)
Total CHOL/HDL Ratio: 2.4 (calc) (ref <5.0)
Triglycerides: 93 mg/dL (ref <150)

## 2019-12-16 NOTE — Assessment & Plan Note (Signed)
Checking lipid panel and adjust as needed lipitor 20 mg daily.

## 2019-12-16 NOTE — Assessment & Plan Note (Signed)
Wants to switch back to dexilant which is prescribed today.

## 2019-12-16 NOTE — Assessment & Plan Note (Signed)
Foot exam done, eye exam up to date just need records. Checking HgA1c and lipid panel. Adjust as needed. Checking microalbumin to creatinine ratio. On statin. Diet controlled.

## 2019-12-16 NOTE — Assessment & Plan Note (Signed)
Flu shot yearly. Covid-19 up to date. Pneumonia declines today. Shingrix declines. Tetanus declines. Colonoscopy aged out future. Mammogram yearly up to date, pap smear aged out and dexa declines further. Counseled about sun safety and mole surveillance. Counseled about the dangers of distracted driving. Given 10 year screening recommendations.

## 2019-12-16 NOTE — Assessment & Plan Note (Signed)
BP at goal on amlodipine 2.5 mg daily. Checking CMP and adjust as needed.

## 2019-12-17 ENCOUNTER — Telehealth: Payer: Self-pay

## 2019-12-17 NOTE — Telephone Encounter (Signed)
New message    Calling for test results  
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
## 2019-12-18 NOTE — Telephone Encounter (Signed)
Pt has been informed.

## 2019-12-18 NOTE — Telephone Encounter (Signed)
Pt requested that the blank forms be mailed to her.   Done.

## 2019-12-30 DIAGNOSIS — Z1231 Encounter for screening mammogram for malignant neoplasm of breast: Secondary | ICD-10-CM | POA: Diagnosis not present

## 2020-01-09 ENCOUNTER — Other Ambulatory Visit: Payer: Self-pay | Admitting: Internal Medicine

## 2020-02-01 DIAGNOSIS — Z20828 Contact with and (suspected) exposure to other viral communicable diseases: Secondary | ICD-10-CM | POA: Diagnosis not present

## 2020-02-02 DIAGNOSIS — H1045 Other chronic allergic conjunctivitis: Secondary | ICD-10-CM | POA: Diagnosis not present

## 2020-02-02 DIAGNOSIS — H18413 Arcus senilis, bilateral: Secondary | ICD-10-CM | POA: Diagnosis not present

## 2020-02-02 DIAGNOSIS — H04123 Dry eye syndrome of bilateral lacrimal glands: Secondary | ICD-10-CM | POA: Diagnosis not present

## 2020-02-02 DIAGNOSIS — H16223 Keratoconjunctivitis sicca, not specified as Sjogren's, bilateral: Secondary | ICD-10-CM | POA: Diagnosis not present

## 2020-02-02 DIAGNOSIS — H11133 Conjunctival pigmentations, bilateral: Secondary | ICD-10-CM | POA: Diagnosis not present

## 2020-02-02 DIAGNOSIS — H11153 Pinguecula, bilateral: Secondary | ICD-10-CM | POA: Diagnosis not present

## 2020-02-02 DIAGNOSIS — H35033 Hypertensive retinopathy, bilateral: Secondary | ICD-10-CM | POA: Diagnosis not present

## 2020-02-02 DIAGNOSIS — H3540 Unspecified peripheral retinal degeneration: Secondary | ICD-10-CM | POA: Diagnosis not present

## 2020-02-02 DIAGNOSIS — H2513 Age-related nuclear cataract, bilateral: Secondary | ICD-10-CM | POA: Diagnosis not present

## 2020-02-03 DIAGNOSIS — Z113 Encounter for screening for infections with a predominantly sexual mode of transmission: Secondary | ICD-10-CM | POA: Diagnosis not present

## 2020-02-03 DIAGNOSIS — R102 Pelvic and perineal pain: Secondary | ICD-10-CM | POA: Diagnosis not present

## 2020-02-08 ENCOUNTER — Telehealth: Payer: Self-pay | Admitting: Internal Medicine

## 2020-02-08 NOTE — Progress Notes (Signed)
°  Chronic Care Management   Outreach Note  02/08/2020 Name: Monica [name] MRN: [id number] DOB: 1941/02/11  Referred by: [name], MD Reason for referral : No chief complaint on file.   An unsuccessful telephone outreach was attempted today. The patient was referred to the pharmacist for assistance with care management and care coordination.   Follow Up Plan:   Carley Perdue [employer] Scheduler

## 2020-02-15 ENCOUNTER — Telehealth: Payer: Self-pay | Admitting: Internal Medicine

## 2020-02-15 NOTE — Progress Notes (Signed)
  Chronic Care Management   Outreach Note  02/15/2020 Name: ZERAH HILYER MRN: [id number] DOB: 1940-08-27  Referred by: [name], MD Reason for referral : No chief complaint on file.   A second unsuccessful telephone outreach was attempted today. The patient was referred to pharmacist for assistance with care management and care coordination.  Follow Up Plan:   Carley Perdue [employer] Scheduler

## 2020-02-16 ENCOUNTER — Telehealth: Payer: Self-pay | Admitting: Internal Medicine

## 2020-02-16 NOTE — Progress Notes (Signed)
  Chronic Care Management   Note  02/16/2020 Name: Monica [name] MRN: [id number] DOB: 1940/12/04  Monica [name] is a 79 y.o. year old female who is a primary care patient of Sharlet Salina Real Cons, MD. I reached out to Eulah Citizen by phone today in response to a referral sent by Ms. Adrian Prows Higbie's PCP, [name], MD.   Ms. Nebergall was given information about Chronic Care Management services today including:  1. CCM service includes personalized support from designated clinical staff supervised by her physician, including individualized plan of care and coordination with other care providers 2. 24/7 contact phone numbers for assistance for urgent and routine care needs. 3. Service will only be billed when office clinical staff spend 20 minutes or more in a month to coordinate care. 4. Only one practitioner may furnish and bill the service in a calendar month. 5. The patient may stop CCM services at any time (effective at the end of the month) by phone call to the office staff.   Patient agreed to services and verbal consent obtained.   Follow up plan:   Carley Perdue [employer] Scheduler
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
## 2020-02-23 ENCOUNTER — Other Ambulatory Visit: Payer: Self-pay

## 2020-02-23 ENCOUNTER — Ambulatory Visit (INDEPENDENT_AMBULATORY_CARE_PROVIDER_SITE_OTHER): Payer: Medicare HMO | Admitting: Internal Medicine

## 2020-02-23 ENCOUNTER — Encounter: Payer: Self-pay | Admitting: Internal Medicine

## 2020-02-23 VITALS — BP 140/78 | HR 92 | Temp 98.3°F | Ht 66.0 in | Wt 147.6 lb

## 2020-02-23 DIAGNOSIS — I10 Essential (primary) hypertension: Secondary | ICD-10-CM | POA: Diagnosis not present

## 2020-02-23 DIAGNOSIS — J3089 Other allergic rhinitis: Secondary | ICD-10-CM | POA: Diagnosis not present

## 2020-02-23 DIAGNOSIS — K219 Gastro-esophageal reflux disease without esophagitis: Secondary | ICD-10-CM | POA: Diagnosis not present

## 2020-02-23 DIAGNOSIS — E1169 Type 2 diabetes mellitus with other specified complication: Secondary | ICD-10-CM

## 2020-02-23 DIAGNOSIS — E785 Hyperlipidemia, unspecified: Secondary | ICD-10-CM | POA: Diagnosis not present

## 2020-02-23 NOTE — Patient Instructions (Addendum)
Try visine dry eye relief eye drops for your dry eyes.    Only take allergy medication if needed.  All allergy medications (nasal sprays and claritin).    Continue all your other medications.       Dry Eye  Dry eye, also called keratoconjunctivitis sicca, is dryness of the membranes surrounding the eye. It happens when there are not enough healthy, natural tears in the eyes. The eyes must remain moist at all times. A small amount of tears is constantly produced by the tear glands (lacrimal glands). These glands are located under the outside part of the upper eyelids. Dry eye can happen on its own or be a symptom of several conditions, such as rheumatoid arthritis, lupus, or Sjgren's syndrome. Dry eye may be mild to severe. What are the causes? This condition may be caused by:  Not making enough tears (aqueous tear-deficient dry eyes).  Tears evaporating from the eyes too quickly (evaporative dry eyes). This is when there is an abnormality in the quality of your tears. This abnormality causes your tears to evaporate so quickly that the eyes cannot be kept moist. What increases the risk? You are more likely to develop this condition if you:  Are a woman, especially if you have gone through menopause.  Live in a dry climate.  Live in a dusty or smoky area.  Take certain medicines, such as: ? Anti-allergy medicines (antihistamines). ? Blood pressure medicines (antihypertensives). ? Birth control pills (oral contraceptives). ? Laxatives. ? Tranquilizers.  Have a history of refractive eye surgery, such as LASIK.  Have a history of long-term contact lens use. What are the signs or symptoms? Symptoms of this condition include:  Irritation.  Itchiness.  Redness.  Burning.  Inflammation of the eyelids.  Feeling as though something is stuck in the eye.  Light sensitivity.  Increased sensitivity and discomfort when wearing contact lenses.  Vision that varies  throughout the day.  Occasional excessive tearing. How is this diagnosed? This condition is diagnosed based on your symptoms, your medical history, and an eye exam.  Your health care provider may look at your eye using a microscope and may put dyes in your eye to check the health of the surface of your eye.  You may have tests, such as a test to evaluate your tear production (Schirmer test). ? During this test, a small strip of special paper is gently pressed into the inner corner of your eye. ? Your tear production is measured by how much of the paper is moistened by your tears during a set amount of time. You may be referred to a health care provider who specializes in eyes and eyesight (ophthalmologist). How is this treated? Treatment for this condition depends on the severity. Mild cases are often treated at home. To help relieve your symptoms, your health care provider may recommend eye drops, which are also called artificial tears.  If your condition is severe, treatment may include: ? Prescription eye drops. ? Over-the-counter or prescription ointments to moisten your eyes. ? Minor surgery to place plugs into the tear ducts. This keep tears from draining so that tears can stay on the surface of the eye longer. ? Medicines to reduce inflammation of the eyelids. ? Taking an omega-3 fatty acid nutritional supplement. Follow these instructions at home:  Take or apply over-the-counter and prescription medicines only as told by your health care provider. This includes eye drops.  If directed, apply a warm compress to your eyes to help  reduce inflammation. Place a towel over your eyes and gently press the warm compress over your eyes for about 5 minutes, or as long as told by your health care provider.  Drink plenty of fluids to stay well hydrated.  If possible, avoid dry, drafty environments.  Wear sunglasses when outdoors to protect your eyes from the sun and wind.  Use a  humidifier at home to increase moisture in the air.  Remember to blink often when reading or using the computer for long periods.  If you wear contact lenses, remove them regularly to give your eyes a break. Always remove contacts before sleeping.  Have a yearly eye exam and vision test.  Keep all follow-up visits as told by your health care provider. This is important. Contact a health care provider if:  You have eye pain.  You have pus-like fluid coming from your eye.  Your symptoms get worse or do not improve with treatment. Get help right away if:  Your vision suddenly changes. Summary  Dry eye is dryness of the membranes surrounding the eye.  Dry eye can happen on its own or be a symptom of several conditions, such as rheumatoid arthritis, lupus, or Sjgren's syndrome.  This condition is diagnosed based on your symptoms, your medical history, and an eye exam.  Treatment for this condition depends on the severity. Mild cases are often treated at home. To help relieve your symptoms, your health care provider may recommend eye drops, which are also called artificial tears. This information is not intended to replace advice given to you by your health care provider. Make sure you discuss any questions you have with your health care provider. Document Revised: 10/22/2017 Document Reviewed: 10/22/2017 Elsevier Patient Education  [employer].

## 2020-02-23 NOTE — Assessment & Plan Note (Signed)
Chronic GERD controlled Continue dexilant 60 mg daily

## 2020-02-23 NOTE — Assessment & Plan Note (Signed)
Chronic Continue atorvastatin 20 mg daily Regular exercise and healthy diet encouraged

## 2020-02-23 NOTE — Assessment & Plan Note (Addendum)
Chronic BP well controlled Continue amlodipine 5 mg daily  

## 2020-02-23 NOTE — Assessment & Plan Note (Signed)
Chronic Mild in nature claritin dried eyes out too much She did purchase generic Afrin, but has not taken it yet.  Discussed that all nasal sprays and oral allergy medications will dry out her eyes.  She is currently using an allergy eyedrop and advised her to discontinue this as well because it likely is drying out her eyes more Her allergy symptoms are relatively mild-advised her not to do anything-if she needs to take allergy medication take it for a short time. Start Visine eyedrops for dry eyes

## 2020-03-01 DIAGNOSIS — H16223 Keratoconjunctivitis sicca, not specified as Sjogren's, bilateral: Secondary | ICD-10-CM | POA: Diagnosis not present

## 2020-03-19 ENCOUNTER — Other Ambulatory Visit: Payer: Self-pay | Admitting: Internal Medicine

## 2020-03-30 ENCOUNTER — Other Ambulatory Visit: Payer: Self-pay

## 2020-03-30 MED ORDER — ATORVASTATIN CALCIUM 20 MG PO TABS: 20.0000 mg | ORAL_TABLET | Freq: Every day | ORAL | 3 refills | Status: DC

## 2020-04-19 ENCOUNTER — Ambulatory Visit: Payer: Medicare HMO

## 2020-04-19 NOTE — Chronic Care Management (AMB) (Deleted)
Chronic Care Management Pharmacy  Name: ADDISYNN VASSELL  MRN: [id number] DOB: 09-Jan-1941   Chief Complaint/ HPI  Monica [name],  79 y.o. , female presents for her Initial CCM visit with the clinical pharmacist In office.  PCP : [name], MD Patient Care Team: [name], MD as PCP - General (Internal Medicine) Newton Pigg, MD (Obstetrics and Gynecology) Katy Apo, MD (Ophthalmology) Sable Feil, MD as Consulting Physician (Gastroenterology) Charlton Haws, [facility] as Pharmacist (Pharmacist)  Patient's chronic conditions include: Hypertension, Hyperlipidemia, Diabetes, GERD, Osteopenia and Allergic Rhinitis, chronic constipation  Office Visits: 02/23/20 Dr Quay Burow OV: c/o anxiety, sleep issues, throat drainage. Advised to stop allergy meds/eye drops, start Visine eye drops.  12/15/19 Dr Sharlet Salina OV: chronic f/u. Switched PPI to [employer]. Labs stable.  08/31/19 Dr Sharlet Salina OV: acute visit for eyes, lump on neck, glaucoma. Advised to use Pataday eye drops daily and lubricant eye drop PRN. Advised claritin or zyrtec for allergies, would avoid nasal steroids due to glaucoma. Advised to f/u with eye doctor for glaucoma/cataracts.  Consult Visit: 02/03/20 Dr Ulanda Edison (OB/GYN): mammogram, STI screening 02/02/20 Dr Rosana Hoes (optometry): f/u for dry eye 08/05/19 Dr Havery Moros (GI): f/u for constipation, GERD. Switched Dexilant to omeprazole and discussed PPI taper given long term issues with PPI.   Allergies  Allergen Reactions  . Codeine     REACTION: Upset stomach  . Hydrochlorothiazide     REACTION: Nausea and headache    Medications: Outpatient Encounter Medications as of 04/19/2020  Medication Sig  . amLODipine (NORVASC) 5 MG tablet Take 1/2 (one-half) tablet by mouth once daily  . aspirin 81 MG tablet Take 81 mg by mouth daily.    [name] atorvastatin (LIPITOR) 20 MG tablet Take 1 tablet (20 mg total) by mouth daily.  . bisacodyl (DULCOLAX) 5 MG EC  tablet Take 5 mg by mouth daily as needed for moderate constipation.  [name] dexlansoprazole (DEXILANT) 60 MG capsule Take 1 capsule (60 mg total) by mouth daily.  . fluorometholone (FML) 0.1 % ophthalmic suspension   . latanoprost (XALATAN) 0.005 % ophthalmic solution   . meloxicam (MOBIC) 7.5 MG tablet Take 1 tablet (7.5 mg total) by mouth daily.  . Polyethylene Glycol 3350 (MIRALAX PO) Take 17 g by mouth daily.  [name] triamcinolone (KENALOG) 0.025 % ointment    No facility-administered encounter medications on file as of 04/19/2020.    Wt Readings from Last 3 Encounters:  02/23/20 147 lb 9.6 oz (67 kg)  12/15/19 148 lb (67.1 kg)  08/31/19 149 lb (67.6 kg)    Current Diagnosis/Assessment:    Goals Addressed   None     Hypertension   BP goal is:  <140/90  Office blood pressures are  BP Readings from Last 3 Encounters:  02/23/20 140/78  12/15/19 132/82  08/31/19 136/82   Lab Results  Component Value Date   CREATININE 0.96 (H) 12/15/2019   BUN 17 12/15/2019   GFR 75.41 06/12/2019   GFRNONAA >60 10/31/2016   GFRAA >60 10/31/2016   NA 141 12/15/2019   K 4.5 12/15/2019   CALCIUM 10.0 12/15/2019   CO2 29 12/15/2019   Patient checks BP at home {CHL HP BP Monitoring [phone]} Patient home BP readings are ranging: ***  Patient has failed these meds in the past: *** Patient is currently {CHL Controlled/[phone]} on the following medications:  . Amlodipine 5 mg daily  We discussed {CHL HP Upstream Pharmacy discussion:[phone]}  Plan  Continue {CHL HP Upstream Pharmacy TIWPY:[id number]}  Hyperlipidemia   LDL goal < 100  Last lipids Lab Results  Component Value Date   CHOL 138 12/15/2019   HDL 58 12/15/2019   LDLCALC 62 12/15/2019   TRIG 93 12/15/2019   CHOLHDL 2.4 12/15/2019   Hepatic Function Latest Ref Rng & Units 12/15/2019 06/12/2019 12/10/2018  Total Protein 6.1 - 8.1 g/dL 7.6 7.5 7.5  Albumin 3.5 - 5.2 g/dL - 4.4 4.4  AST 10 -  35 U/L _0 ALT 6 - 29 U/L _1 Alk Phosphatase 39 - 117 U/L - 57 55  Total Bilirubin 0.2 - 1.2 mg/dL 0.8 0.8 0.8  Bilirubin, Direct 0.0 - 0.3 mg/dL - - -     The 10-year ASCVD risk score Mikey Bussing DC Jr., et al., 2013) is: 30.3%   Values used to calculate the score:     Age: 61 years     Sex: Female     Is Non-Hispanic African American: Yes     Diabetic: Yes     Tobacco smoker: No     Systolic Blood Pressure: 657 mmHg     Is BP treated: Yes     HDL Cholesterol: 58 mg/dL     Total Cholesterol: 138 mg/dL   Patient has failed these meds in past: *** Patient is currently {CHL Controlled/Uncontrolled:[phone]} on the following medications:  . Atorvastatin 20 mg daily . Aspirin 81 mg daily  We discussed:  {CHL HP Upstream Pharmacy discussion:[phone]}  Plan  Continue {CHL HP Upstream Pharmacy Plans:[phone]}  Diabetes   A1c goal <7%  Recent Relevant Labs: Lab Results  Component Value Date/Time   HGBA1C 6.7 (H) 12/15/2019 01:48 PM   HGBA1C 6.9 (H) 06/12/2019 09:09 AM   GFR 75.41 06/12/2019 09:09 AM   GFR 74.53 12/10/2018 09:33 AM   MICROALBUR 1.2 12/15/2019 01:48 PM   MICROALBUR 1.1 12/10/2018 09:33 AM    Last diabetic Eye exam:  Lab Results  Component Value Date/Time   HMDIABEYEEXA No Retinopathy 01/23/2018 12:00 AM    Last diabetic Foot exam: No results found for: HMDIABFOOTEX   Checking BG: {CHL HP Blood Glucose Monitoring Frequency:[phone]}  Recent FBG Readings: *** Recent pre-meal BG readings: *** Recent 2hr PP BG readings:  *** Recent HS BG readings: ***  Patient has failed these meds in past: metformin Patient is currently {CHL Controlled/Uncontrolled:[phone]} on the following medications: . No medication indicated  We discussed: {CHL HP Upstream Pharmacy discussion:[phone]}  Plan  Continue {CHL HP Upstream Pharmacy Plans:[phone]}  Osteopenia   Last DEXA Scan: 08/10/2013   T-Score femoral neck: -1.6  Last vitamin  D/Calcium Lab Results  Component Value Date   VD25OH 35.89 06/12/2018   CALCIUM 10.0 12/15/2019   Patient is not a candidate for pharmacologic treatment  Patient has failed these meds in past: n/a Patient is currently {CHL Controlled/Uncontrolled:[phone]} on the following medications:  . ***  We discussed:  {Osteoporosis Counseling:23892}  Plan  Continue {CHL HP Upstream Pharmacy Plans:[phone]}  GERD   Patient has failed these meds in past: *** Patient is currently {CHL Controlled/Uncontrolled:[phone]} on the following medications:  . Dexilant 60 mg daily  We discussed:  ***  Plan  Continue {CHL HP Upstream Pharmacy Plans:[phone]}  Chronic constipation   Patient has failed these meds in past: [place] Patient is currently {CHL Controlled/Uncontrolled:[phone]} on the following medications:  [name] Miralax PRN . Bisacodyl 5 mg PRN  We discussed:  ***  Plan  Continue {CHL HP Upstream Pharmacy XUXYB:[id number]}  Osteoarthritis   Patient  has failed these meds in past: *** Patient is currently {CHL Controlled/Uncontrolled:[phone]} on the following medications:  [name] Meloxicam 7.5 mg daily  We discussed:  ***  Plan  Continue {CHL HP Upstream Pharmacy YOCHV:[id number]}  Glaucoma   Patient has failed these meds in past: *** Patient is currently {CHL Controlled/Uncontrolled:[phone]} on the following medications:  . Latanoprost 0.005% eye drops . Flurometholone 0.1% eye drops  We discussed:  ***  Plan  Continue {CHL HP Upstream Pharmacy NTJXK:[id number]}  Vaccines   Reviewed and discussed patient's vaccination history.    Immunization History  Administered Date(s) Administered  . PFIZER SARS-COV-2 Vaccination 07/04/2019, 07/27/2019    Plan  Recommended patient receive *** vaccine in *** office.   Health Maintenance   Patient is currently {CHL Controlled/Uncontrolled:[phone]} on the following medications:  . Triamcinolone 0.025%  ointment  We discussed:  ***  Plan  Continue {CHL HP Upstream Pharmacy IJLTH:[id number]}  Medication Management   Patient's preferred pharmacy is:  [place] [place] [facility] [id number] [place] [address] Phone: [phone] Fax: [phone]  Uses pill box? {Yes or If no, why not?:20788} Pt endorses ***% compliance  We discussed: {Pharmacy options:24294}  Plan  {US Pharmacy OUMN:61224}    Follow up: *** month phone visit  ***

## 2020-05-17 DIAGNOSIS — H401131 Primary open-angle glaucoma, bilateral, mild stage: Secondary | ICD-10-CM | POA: Diagnosis not present

## 2020-06-10 ENCOUNTER — Encounter: Payer: Self-pay | Admitting: Internal Medicine

## 2020-06-10 ENCOUNTER — Ambulatory Visit (INDEPENDENT_AMBULATORY_CARE_PROVIDER_SITE_OTHER): Payer: Medicare HMO | Admitting: Internal Medicine

## 2020-06-10 ENCOUNTER — Other Ambulatory Visit: Payer: Self-pay

## 2020-06-10 VITALS — BP 132/70 | HR 79 | Temp 98.0°F | Resp 18 | Wt 145.4 lb

## 2020-06-10 DIAGNOSIS — R0789 Other chest pain: Secondary | ICD-10-CM | POA: Insufficient documentation

## 2020-06-10 MED ORDER — DEXLANSOPRAZOLE 60 MG PO CPDR: 60.0000 mg | DELAYED_RELEASE_CAPSULE | Freq: Two times a day (BID) | ORAL | 0 refills | Status: DC

## 2020-06-10 NOTE — Assessment & Plan Note (Signed)
EKG done without change. Suspect GERD flare. Advised to take dexilant 1 pill twice a day for 2 weeks then resume 1 pill daily.

## 2020-06-10 NOTE — Patient Instructions (Addendum)
I would recommend to increase the dexilant to twice a day for 2 weeks then go back to once a day.  Try claritin for a few days to help also.

## 2020-06-10 NOTE — Progress Notes (Signed)
   Subjective:   Patient ID: Monica [name], female    DOB: [birth year], 80 y.o.   MRN: [id number]  HPI The patient is a 80 YO female coming in for concerns about lower chest pain and upper stomach pain. Pain is located left sided at the rib cage. Started in the last week or so. Mild during the day and worse at night time. She gets relief by lying on the right side rather than left. She is taking dexilant chronically and denies missing doses. She denies changes in diet recently. Denies worsening pain with exertion. Did drink some juice with turmeric and then ginger drink and felt that this helped some. Pain was not as severe last night. Overall improving. Pain at worst was 10/10.   Review of Systems  Constitutional: Negative.   HENT: Negative.   Eyes: Negative.   Respiratory: Negative for cough, chest tightness and shortness of breath.   Cardiovascular: Positive for chest pain. Negative for palpitations and leg swelling.  Gastrointestinal: Positive for abdominal pain. Negative for abdominal distention, constipation, diarrhea, nausea and vomiting.  Musculoskeletal: Negative.   Skin: Negative.   Neurological: Negative.   Psychiatric/Behavioral: Negative.     Objective:  Physical Exam Constitutional:      Appearance: She is well-developed and well-nourished.  HENT:     Head: Normocephalic and atraumatic.  Eyes:     Extraocular Movements: EOM normal.  Cardiovascular:     Rate and Rhythm: Normal rate and regular rhythm.  Pulmonary:     Effort: Pulmonary effort is normal. No respiratory distress.     Breath sounds: Normal breath sounds. No wheezing or rales.  Abdominal:     General: Bowel sounds are normal. There is no distension.     Palpations: Abdomen is soft.     Tenderness: There is abdominal tenderness. There is no rebound.     Comments: Pain midline epigastric region  Musculoskeletal:        General: No edema.     Cervical back: Normal range of motion.  Skin:    General: Skin  is warm and dry.  Neurological:     Mental Status: She is alert and oriented to person, place, and time.     Coordination: Coordination normal.  Psychiatric:        Mood and Affect: Mood and affect normal.     Vitals:   06/10/20 0936  BP: 132/70  Pulse: 79  Resp: 18  Temp: 98 F (36.7 C)  TempSrc: Oral  SpO2: 96%  Weight: 145 lb 6.4 oz (66 kg)   EKG: Rate 67, axis normal, interval normal, sinus, no st or t wave changes, no significant change when compared to 2019  This visit occurred during the SARS-CoV-2 public health emergency.  Safety protocols were in place, including screening questions prior to the visit, additional usage of staff PPE, and extensive cleaning of exam room while observing appropriate contact time as indicated for disinfecting solutions.   Assessment & Plan:

## 2020-07-08 DIAGNOSIS — R319 Hematuria, unspecified: Secondary | ICD-10-CM | POA: Diagnosis not present

## 2020-07-08 DIAGNOSIS — R8290 Unspecified abnormal findings in urine: Secondary | ICD-10-CM | POA: Diagnosis not present

## 2020-07-08 DIAGNOSIS — Z124 Encounter for screening for malignant neoplasm of cervix: Secondary | ICD-10-CM | POA: Diagnosis not present

## 2020-07-08 DIAGNOSIS — Z01419 Encounter for gynecological examination (general) (routine) without abnormal findings: Secondary | ICD-10-CM | POA: Diagnosis not present

## 2020-07-08 DIAGNOSIS — D259 Leiomyoma of uterus, unspecified: Secondary | ICD-10-CM | POA: Diagnosis not present

## 2020-08-23 DIAGNOSIS — R3121 Asymptomatic microscopic hematuria: Secondary | ICD-10-CM | POA: Diagnosis not present

## 2020-09-01 ENCOUNTER — Telehealth: Payer: Self-pay | Admitting: Internal Medicine

## 2020-09-05 NOTE — Telephone Encounter (Signed)
Initiate PA for dexilant? Please advise

## 2020-09-05 NOTE — Telephone Encounter (Signed)
Patient states when she went to the pharmacy they said her insurance will no longer cover the DEXILANT 60 MG capsule and it would be 600 dollars. Patient wondering what needs to be done.

## 2020-09-06 NOTE — Telephone Encounter (Signed)
If PA is needed then yes.

## 2020-09-07 NOTE — Telephone Encounter (Signed)
Attempting to do the PA for Dexilant 60 mg. PA not needed if the patient is prescribed Dexilant 60MG  dr capsules. Would like me to proceed wit the original prior authorization? Please advise

## 2020-09-08 NOTE — Telephone Encounter (Signed)
I'm not actually sure what you are asking me. Can you clarify?

## 2020-09-09 NOTE — Telephone Encounter (Signed)
Prior authorization has been initiated on covermymeds for Dexilant 60 mg. Key:[id number] PA Case ID: [id number] ICD 10 Code: K21.9   I will update the patient once a decision has been made.

## 2020-09-19 ENCOUNTER — Telehealth: Payer: Self-pay

## 2020-09-19 DIAGNOSIS — R3121 Asymptomatic microscopic hematuria: Secondary | ICD-10-CM | POA: Diagnosis not present

## 2020-09-19 DIAGNOSIS — N281 Cyst of kidney, acquired: Secondary | ICD-10-CM | POA: Diagnosis not present

## 2020-09-19 DIAGNOSIS — D1809 Hemangioma of other sites: Secondary | ICD-10-CM | POA: Diagnosis not present

## 2020-09-19 DIAGNOSIS — R3129 Other microscopic hematuria: Secondary | ICD-10-CM | POA: Diagnosis not present

## 2020-09-19 DIAGNOSIS — D259 Leiomyoma of uterus, unspecified: Secondary | ICD-10-CM | POA: Diagnosis not present

## 2020-09-19 NOTE — Chronic Care Management (AMB) (Signed)
Spoke with patient, scheduled for initial CCM visit 10/04/2020 after appointment with PCP   Tomasa Blase, PharmD Clinical Pharmacist, [place]

## 2020-10-03 NOTE — Progress Notes (Signed)
Chronic Care Management Pharmacy Note  10/04/2020 Name:  Monica [name] MRN:  [id number] DOB:  11-06-1940  Subjective: Monica [name] is an 80 y.o. year old female who is a primary patient of [name], MD.  The CCM team was consulted for assistance with disease management and care coordination needs.    Engaged with patient face to face for initial visit in response to provider referral for pharmacy case management and/or care coordination services.   Consent to Services:  The patient was given the following information about Chronic Care Management services today, agreed to services, and gave verbal consent: 1. CCM service includes personalized support from designated clinical staff supervised by the primary care provider, including individualized plan of care and coordination with other care providers 2. 24/7 contact phone numbers for assistance for urgent and routine care needs. 3. Service will only be billed when office clinical staff spend 20 minutes or more in a month to coordinate care. 4. Only one practitioner may furnish and bill the service in a calendar month. 5.The patient may stop CCM services at any time (effective at the end of the month) by phone call to the office staff. 6. The patient will be responsible for cost sharing (co-pay) of up to 20% of the service fee (after annual deductible is met). Patient agreed to services and consent obtained.  Patient Care Team: [name], MD as PCP - General (Internal Medicine) Newton Pigg, MD (Obstetrics and Gynecology) Katy Apo, MD (Ophthalmology) Sable Feil, MD as Consulting Physician (Gastroenterology) Charlton Haws, [facility] as Pharmacist (Pharmacist)  Recent office visits: 10/04/2020 - PCP visit- meloxicam refilled  06/10/2020 - PCP visit for lower chest / upper stomach pain - thought to be due GERD, dexilant increased to BID x 2 weeks then returned to QD dosing  02/23/2020 - Dr. Quay Burow - no  changes   Recent consult visits: 05/17/2020 - Optometry Visit  03/01/2020 - [facility] visits: None in previous 6 months  Objective:  Lab Results  Component Value Date   CREATININE 0.96 (H) 12/15/2019   BUN 17 12/15/2019   GFR 75.41 06/12/2019   GFRNONAA >60 10/31/2016   GFRAA >60 10/31/2016   NA 141 12/15/2019   K 4.5 12/15/2019   CALCIUM 10.0 12/15/2019   CO2 29 12/15/2019   GLUCOSE 96 12/15/2019    Lab Results  Component Value Date/Time   HGBA1C 6.7 (H) 12/15/2019 01:48 PM   HGBA1C 6.9 (H) 06/12/2019 09:09 AM   GFR 75.41 06/12/2019 09:09 AM   GFR 74.53 12/10/2018 09:33 AM   MICROALBUR 1.2 12/15/2019 01:48 PM   MICROALBUR 1.1 12/10/2018 09:33 AM    Last diabetic Eye exam:  Lab Results  Component Value Date/Time   HMDIABEYEEXA No Retinopathy 01/23/2018 12:00 AM    Last diabetic Foot exam:  No results found for: HMDIABFOOTEX   Lab Results  Component Value Date   CHOL 138 12/15/2019   HDL 58 12/15/2019   LDLCALC 62 12/15/2019   TRIG 93 12/15/2019   CHOLHDL 2.4 12/15/2019    Hepatic Function Latest Ref Rng & Units 12/15/2019 06/12/2019 12/10/2018  Total Protein 6.1 - 8.1 g/dL 7.6 7.5 7.5  Albumin 3.5 - 5.2 g/dL - 4.4 4.4  AST 10 - 35 U/L _0 ALT 6 - 29 U/L _1 Alk Phosphatase 39 - 117 U/L - 57 55  Total Bilirubin 0.2 - 1.2 mg/dL 0.8 0.8 0.8  Bilirubin, Direct  0.0 - 0.3 mg/dL - - -    Lab Results  Component Value Date/Time   TSH 1.70 06/12/2018 01:39 PM   TSH 2.33 11/07/2017 09:13 AM    CBC Latest Ref Rng & Units 12/15/2019 06/12/2019 12/10/2018  WBC 3.8 - 10.8 Thousand/uL 5.6 3.3(L) 3.4(L)  Hemoglobin 11.7 - 15.5 g/dL 13.5 13.0 13.2  Hematocrit 35.0 - 45.0 % 42.5 39.9 40.5  Platelets 140 - 400 Thousand/uL 242 224.0 235.0    Lab Results  Component Value Date/Time   VD25OH 35.89 06/12/2018 01:39 PM   VD25OH 43.00 11/07/2017 09:13 AM    Clinical ASCVD: No  The 10-year ASCVD risk score Mikey Bussing DC Jr., et al., 2013) is: 29.5%    Values used to calculate the score:     Age: 31 years     Sex: Female     Is Non-Hispanic African American: Yes     Diabetic: Yes     Tobacco smoker: No     Systolic Blood Pressure: 174 mmHg     Is BP treated: Yes     HDL Cholesterol: 58 mg/dL     Total Cholesterol: 138 mg/dL    Depression screen [facility] 2/9 06/10/2020 09/19/2018 08/12/2017  Decreased Interest 0 0 0  Down, Depressed, Hopeless 0 0 1  PHQ - 2 Score 0 0 1  Altered sleeping - 0 0  Tired, decreased energy - 0 0  Change in appetite - 0 0  Feeling bad or failure about yourself  - 0 0  Trouble concentrating - 0 0  Moving slowly or fidgety/restless - 0 0  Suicidal thoughts - 0 0  PHQ-9 Score - 0 1  Difficult doing work/chores - Not difficult at all Not difficult at all      Social History   Tobacco Use  Smoking Status Never Smoker  Smokeless Tobacco Never Used   BP Readings from Last 3 Encounters:  10/04/20 132/80  06/10/20 132/70  02/23/20 140/78   Pulse Readings from Last 3 Encounters:  10/04/20 65  06/10/20 79  02/23/20 92   Wt Readings from Last 3 Encounters:  10/04/20 147 lb 9.6 oz (67 kg)  06/10/20 145 lb 6.4 oz (66 kg)  02/23/20 147 lb 9.6 oz (67 kg)   BMI Readings from Last 3 Encounters:  10/04/20 23.82 kg/m  06/10/20 23.47 kg/m  02/23/20 23.82 kg/m    Assessment/Interventions: Review of patient past medical history, allergies, medications, health status, including review of consultants reports, laboratory and other test data, was performed as part of comprehensive evaluation and provision of chronic care management services.   SDOH:  (Social Determinants of Health) assessments and interventions performed: Yes  SDOH Screenings   Alcohol Screen: Not on file  Depression (PHQ2-9): Low Risk   . PHQ-2 Score: 0  Financial Resource Strain: Not on file  Food Insecurity: Not on file  Housing: Not on file  Physical Activity: Not on file  Social Connections: Not on file  Stress: Not on file   Tobacco Use: Low Risk   . Smoking Tobacco Use: Never Smoker  . Smokeless Tobacco Use: Never Used  Transportation Needs: Not on file    CCM Care Plan  Allergies  Allergen Reactions  . Codeine     REACTION: Upset stomach  . Hydrochlorothiazide     REACTION: Nausea and headache    Medications Reviewed Today    Reviewed by Tomasa Blase, [facility] (Pharmacist) on 10/04/20 at 1433  Med List Status: <None>  Medication Order  Taking? Sig Documenting Provider Last Dose Status Informant  amLODipine (NORVASC) 5 MG tablet [id number] Yes Take 1/2 (one-half) tablet by mouth once daily [name], MD Taking Active   aspirin 81 MG tablet [id number] Yes Take 81 mg by mouth daily. [provider] Taking Active Self  atorvastatin (LIPITOR) 20 MG tablet [id number] Yes Take 1 tablet (20 mg total) by mouth daily. [name], MD Taking Active   Calcium Carbonate-Vitamin D3 600-400 MG-UNIT TABS [id number] Yes Take 2 tablets by mouth daily. [provider] Taking Active   dexlansoprazole (DEXILANT) 60 MG capsule [id number] Yes Take 1 capsule (60 mg total) by mouth daily. [name], MD Taking Active   fluorometholone (FML) 0.1 % ophthalmic suspension [id number] Yes Place 1 drop into both eyes daily. [provider] Taking Active   latanoprost (XALATAN) 0.005 % ophthalmic solution [id number] Yes Place 1 drop into both eyes at bedtime. [provider] Taking Active   meloxicam (MOBIC) 7.5 MG tablet [id number] Yes Take 1 tablet (7.5 mg total) by mouth daily. [name], MD Taking Active   Polyethylene Glycol 3350 (MIRALAX PO) [id number] Yes Take 17 g by mouth daily. [provider] Taking Active   triamcinolone (KENALOG) 0.025 % ointment [id number] No   Patient not taking: Reported on 10/04/2020   [provider] Not Taking Active           Patient Active Problem List   Diagnosis Date Noted  . Atypical chest pain  06/10/2020  . Allergic rhinitis 01/28/2019  . Dizziness 03/28/2018  . Routine general medical examination at a health care facility 06/18/2016  . Internal hemorrhoids 07/02/2013  . Diet-controlled diabetes mellitus ([place]) 03/09/2013  . 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 Chronic constipation 02/27/2011  . GERD (gastroesophageal reflux disease) 02/27/2011  . Hyperlipidemia associated with type 2 diabetes mellitus ([place]) 01/14/2009  . ESOPHAGEAL STRICTURE 11/11/2008  . Essential hypertension 12/01/2007  . OSTEOPENIA 09/12/2007  . Unspecified glaucoma 05/07/2007  . Arthropathy 05/07/2007    Immunization History  Administered Date(s) Administered  . PFIZER(Purple Top)SARS-COV-2 Vaccination 07/04/2019, 07/27/2019    Conditions to be addressed/monitored:  Hypertension, Hyperlipidemia, Diabetes, GERD and Open-angled Glaucoma   Care Plan : CCM Care Plan  Updates made by Tomasa Blase, [place] since 10/04/2020 12:00 AM    Problem: HTN, DM2, Osteopenia, HLD, GERD   Priority: High  Onset Date: 10/04/2020    Goal: Disease Management   Start Date: 10/04/2020  Expected End Date: 04/06/2021  This Visit's Progress: On track  Priority: High  Note:   Current Barriers:  . Unable to independently monitor therapeutic efficacy . Does not adhere to prescribed medication regimen  Pharmacist Clinical Goal(s):  [name] Patient will achieve adherence to monitoring guidelines and medication adherence to achieve therapeutic efficacy . maintain control of BP and A1c  as evidenced by continued controlled BP when checked at home and in office, and A1c remaining <7.0%  through collaboration with PharmD and provider.   Interventions: . 1:1 collaboration with [name], MD regarding development and update of comprehensive plan of care as evidenced by provider attestation and co-signature . Inter-disciplinary care team collaboration (see longitudinal plan of care) . Comprehensive medication review performed;  medication list updated in electronic medical record  Hypertension (BP goal <130/80) -Controlled -Current treatment: . Amlodipine 67m - 1/2 tablet daily   -Medications previously tried: hctz  -Current home readings: 132/80 HR 65 -Current dietary habits: reports that she uses salt in moderation, has little caffeine  throughout the day  -Current exercise habits: n/a -Denies hypotensive/hypertensive symptoms -Educated on BP goals and benefits of medications for prevention of heart attack, stroke and kidney damage; Daily salt intake goal < 2300 mg; Symptoms of hypotension and importance of maintaining adequate hydration; -Counseled to monitor BP at home daily if able, document, and provide log at future appointments -Counseled on diet and exercise extensively Recommended to continue current medication Recommended for patient to switch to amlodipine 2.46m tablet - will continue with 2.559mdaily dose - patient has trouble splitting the 51m22mablet   Hyperlipidemia: (LDL goal < 70 mg/dL) -Controlled  LDL 62 mg/dL - 12/15/2019 -Current treatment: . Atorvastatin 46m89mily . ASA 81mg60mly   -Medications previously tried: n/a  -Current dietary patterns: reports to diet low in fried / fatty foods  -Educated on Cholesterol goals;  Benefits of statin for ASCVD risk reduction; Importance of limiting foods high in cholesterol; -Counseled on diet and exercise extensively Recommended for patient to start taking atorvastatin in the AM, notes she has been taking at night but has trouble remembering to take every night   Diabetes (A1c goal <7%) -Controlled -Current medications: . N/a - DM at this time is diet controlled  -Medications previously tried: n/a  -Current home glucose readings . 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 Latanoprost 0.005% solution  - 1 drop in each eye daily  . Fluoromethalone 0.1% solution  - 1 drop in each eye daily  -Medications previously tried: Refresh eye drops   -Recommended to continue current medication   GERD (Goal: Acid control / prevention of flares ) -Controlled -Current treatment  . Dexlansoprazole 60mg 73my -Medications previously tried: n/a  -Recommended to continue current medication   Osteoporosis / Osteopenia (Goal Promotion of bone health / prevention of disease ) -Not ideally controlled -Last DEXA Scan: 08/11/2013   T-Score femoral neck: -1.6  T-Score total hip: -1.6  T-Score lumbar spine: -0.7  T-Score forearm radius: n/a  10-year probability of major osteoporotic fracture: 5.7%  10-year probability of hip fracture: 1.4%   *per FRAX tool calculation -Patient is a candidate for pharmacologic treatment due to femoral neck t score of -1.6 - indicating osteopenia, would recommend supplementation with Ca/ Vit D -Current treatment  . n/a -Medications previously tried: Calcium and VitD  -Recommend [id number] units of vitamin D daily. Recommend 1200 mg of calcium daily from dietary and supplemental sources. -Counseled on diet and exercise extensively   Start:  Ca/ VitD supplementation - Ca1200mg /78munits   Health Maintenance -Current therapy:   . MiralMarland Kitchenx - 17g daily prn  . Meloxicam 7.51mg dai8m . Triamcinolone 0.025% ointment  -Educated on Cost vs benefit of each product must be carefully weighed by individual consumer -Patient is satisfied with current therapy and denies issues -Recommended to continue current medication   Patient Goals/Self-Care Activities . Patient will:  - take medications as  prescribed  Follow Up Plan: Face to Face appointment with care management team member scheduled for:  The patient has been provided with contact information for the care management team and has been advised to call with any health related  questions or concerns.         Medication Assistance: None required.  Patient affirms current coverage meets needs.  Patient's preferred pharmacy is:  [facility] [place], [place] - [address] [place] [address] Phone: [phone] Fax: [phone]   Uses pill box? No Pt endorses 75% compliance - not taking atorvastatin   Care Plan and Follow Up Patient Decision:  Patient agrees to Care Plan and Follow-up.  Plan: Face to Face appointment with care management team member scheduled for: 6 months  and The patient has been provided with contact information for the care management team and has been advised to call with any health related questions or concerns.

## 2020-10-04 ENCOUNTER — Ambulatory Visit (INDEPENDENT_AMBULATORY_CARE_PROVIDER_SITE_OTHER): Payer: Medicare HMO | Admitting: Internal Medicine

## 2020-10-04 ENCOUNTER — Ambulatory Visit: Payer: Medicare HMO

## 2020-10-04 ENCOUNTER — Encounter: Payer: Self-pay | Admitting: Internal Medicine

## 2020-10-04 ENCOUNTER — Other Ambulatory Visit: Payer: Self-pay

## 2020-10-04 ENCOUNTER — Ambulatory Visit (INDEPENDENT_AMBULATORY_CARE_PROVIDER_SITE_OTHER): Payer: Medicare HMO

## 2020-10-04 DIAGNOSIS — E1169 Type 2 diabetes mellitus with other specified complication: Secondary | ICD-10-CM | POA: Diagnosis not present

## 2020-10-04 DIAGNOSIS — E785 Hyperlipidemia, unspecified: Secondary | ICD-10-CM

## 2020-10-04 DIAGNOSIS — M129 Arthropathy, unspecified: Secondary | ICD-10-CM | POA: Diagnosis not present

## 2020-10-04 DIAGNOSIS — E119 Type 2 diabetes mellitus without complications: Secondary | ICD-10-CM

## 2020-10-04 DIAGNOSIS — I10 Essential (primary) hypertension: Secondary | ICD-10-CM

## 2020-10-04 DIAGNOSIS — K219 Gastro-esophageal reflux disease without esophagitis: Secondary | ICD-10-CM | POA: Diagnosis not present

## 2020-10-04 DIAGNOSIS — H409 Unspecified glaucoma: Secondary | ICD-10-CM

## 2020-10-04 DIAGNOSIS — R3121 Asymptomatic microscopic hematuria: Secondary | ICD-10-CM | POA: Diagnosis not present

## 2020-10-04 MED ORDER — DEXLANSOPRAZOLE 60 MG PO CPDR: 60.0000 mg | DELAYED_RELEASE_CAPSULE | Freq: Every day | ORAL | 3 refills | Status: DC

## 2020-10-04 MED ORDER — MELOXICAM 7.5 MG PO TABS: 7.5000 mg | ORAL_TABLET | Freq: Every day | ORAL | 3 refills | Status: DC

## 2020-10-04 NOTE — Patient Instructions (Signed)
Visit Information   PATIENT GOALS:  Goals Addressed            This Visit's Progress   . Track and Manage My Blood Pressure-Hypertension       Timeframe:  Long-Range Goal Priority:  High Start Date:  10/04/2020                     Expected End Date:  04/06/2021                     Follow Up Date 04/06/2021    - check blood pressure 3 times per week - choose a place to take my blood pressure (home, clinic or office, retail store) - write blood pressure results in a log or diary    Why is this important?    You won't feel high blood pressure, but it can still hurt your blood vessels.   High blood pressure can cause heart or kidney problems. It can also cause a stroke.   Making lifestyle changes like losing a little weight or eating less salt will help.   Checking your blood pressure at home and at different times of the day can help to control blood pressure.   If the doctor prescribes medicine remember to take it the way the doctor ordered.   Call the office if you cannot afford the medicine or if there are questions about it.     Notes:        Consent to CCM Services: Ms. Wolter was given information about Chronic Care Management services today including:  1. CCM service includes personalized support from designated clinical staff supervised by her physician, including individualized plan of care and coordination with other care providers 2. 24/7 contact phone numbers for assistance for urgent and routine care needs. 3. Service will only be billed when office clinical staff spend 20 minutes or more in a month to coordinate care. 4. Only one practitioner may furnish and bill the service in a calendar month. 5. The patient may stop CCM services at any time (effective at the end of the month) by phone call to the office staff. 6. The patient will be responsible for cost sharing (co-pay) of up to 20% of the service fee (after annual deductible is met).  Patient agreed to  services and verbal consent obtained.   The patient verbalized understanding of instructions, educational materials, and care plan provided today and declined offer to receive copy of patient instructions, educational materials, and care plan.   Face to Face appointment with care management team member scheduled for:  The patient has been provided with contact information for the care management team and has been advised to call with any health related questions or concerns.   Daniel C Szabat, PharmD Clinical Pharmacist, [facility]   CLINICAL CARE PLAN: Patient Care Plan: CCM Care Plan    Problem Identified: HTN, DM2, Osteopenia, HLD, GERD   Priority: High  Onset Date: 10/04/2020    Goal: Disease Management   Start Date: 10/04/2020  Expected End Date: 04/06/2021  This Visit's Progress: On track  Priority: High  Note:   Current Barriers:  . Unable to independently monitor therapeutic efficacy . Does not adhere to prescribed medication regimen  Pharmacist Clinical Goal(s):  . Patient will achieve adherence to monitoring guidelines and medication adherence to achieve therapeutic efficacy . maintain control of BP and A1c  as evidenced by continued controlled BP when checked at home and   in office, and A1c remaining <7.0%  through collaboration with PharmD and provider.   Interventions: . 1:1 collaboration with Crawford, Elizabeth A, MD regarding development and update of comprehensive plan of care as evidenced by provider attestation and co-signature . Inter-disciplinary care team collaboration (see longitudinal plan of care) . Comprehensive medication review performed; medication list updated in electronic medical record  Hypertension (BP goal <130/80) -Controlled -Current treatment: . Amlodipine 5mg - 1/2 tablet daily   -Medications previously tried: hctz  -Current home readings: 132/80 HR 65 -Current dietary habits: reports that she uses salt in moderation, has little  caffeine throughout the day  -Current exercise habits: n/a -Denies hypotensive/hypertensive symptoms -Educated on BP goals and benefits of medications for prevention of heart attack, stroke and kidney damage; Daily salt intake goal < 2300 mg; Symptoms of hypotension and importance of maintaining adequate hydration; -Counseled to monitor BP at home daily if able, document, and provide log at future appointments -Counseled on diet and exercise extensively Recommended to continue current medication Recommended for patient to switch to amlodipine 2.5mg tablet - will continue with 2.5mg daily dose - patient has trouble splitting the 5mg tablet   Hyperlipidemia: (LDL goal < 70 mg/dL) -Controlled  LDL 62 mg/dL - 12/15/2019 -Current treatment: . Atorvastatin 20mg daily . ASA 81mg daily   -Medications previously tried: n/a  -Current dietary patterns: reports to diet low in fried / fatty foods  -Educated on Cholesterol goals;  Benefits of statin for ASCVD risk reduction; Importance of limiting foods high in cholesterol; -Counseled on diet and exercise extensively Recommended for patient to start taking atorvastatin in the AM, notes she has been taking at night but has trouble remembering to take every night   Diabetes (A1c goal <7%) -Controlled -Current medications: . N/a - DM at this time is diet controlled  -Medications previously tried: n/a  -Current home glucose readings . 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 Latanoprost 0.005% solution  - 1 drop in each eye daily   . Fluoromethalone 0.1% solution  - 1 drop in each eye daily  -Medications previously tried: Refresh eye drops   -Recommended to continue current medication   GERD (Goal: Acid control / prevention of flares ) -Controlled -Current treatment  . Dexlansoprazole 60mg daily -Medications previously tried: n/a  -Recommended to continue current medication   Osteoporosis / Osteopenia (Goal Promotion of bone health / prevention of disease ) -Not ideally controlled -Last DEXA Scan: 08/11/2013   T-Score femoral neck: -1.6  T-Score total hip: -1.6  T-Score lumbar spine: -0.7  T-Score forearm radius: n/a  10-year probability of major osteoporotic fracture: 5.7%  10-year probability of hip fracture: 1.4%   *per FRAX tool calculation -Patient is a candidate for pharmacologic treatment due to femoral neck t score of -1.6 - indicating osteopenia, would recommend supplementation with Ca/ Vit D -Current treatment  . n/a -Medications previously tried: Calcium and VitD  -Recommend 800-1000 units of vitamin D daily. Recommend 1200 mg of calcium daily from dietary and supplemental sources. -Counseled on diet and exercise extensively   Start:  Ca/ VitD supplementation - Ca1200mg / 800units   Health Maintenance -  Current therapy:   . Miralax - 17g daily prn  . Meloxicam 7.5mg daily  . Triamcinolone 0.025% ointment  -Educated on Cost vs benefit of each product must be carefully weighed by individual consumer -Patient is satisfied with current therapy and denies issues -Recommended to continue current medication   Patient Goals/Self-Care Activities . Patient will:  - take medications as prescribed  Follow Up Plan: Face to Face appointment with care management team member scheduled for:  The patient has been provided with contact information for the care management team and has been advised to call with any health related questions or concerns.       

## 2020-10-04 NOTE — Progress Notes (Signed)
   Subjective:   Patient ID: Monica [name], female    DOB: 23-Jun-1940, 80 y.o.   MRN: [id number]  HPI The patient is a 80 YO female coming in for concerns about dexilant. She was taking this twice a day since our last visit. Then ran out for awhile and is not back on twice a day for several weeks. She is thinking this helped her stomach but is not wanting to be on this long term twice a day. Denies chest pains any longer. Having more arthritis pain and stiffness in the morning. Would like refill meloxicam.  Review of Systems  Constitutional: Negative.   HENT: Negative.   Eyes: Negative.   Respiratory: Negative for cough, chest tightness and shortness of breath.   Cardiovascular: Negative for chest pain, palpitations and leg swelling.  Gastrointestinal: Negative for abdominal distention, abdominal pain, constipation, diarrhea, nausea and vomiting.  Musculoskeletal: Negative.   Skin: Negative.   Neurological: Negative.   Psychiatric/Behavioral: Negative.     Objective:  Physical Exam Constitutional:      Appearance: She is well-developed.  HENT:     Head: Normocephalic and atraumatic.  Cardiovascular:     Rate and Rhythm: Normal rate and regular rhythm.  Pulmonary:     Effort: Pulmonary effort is normal. No respiratory distress.     Breath sounds: Normal breath sounds. No wheezing or rales.  Abdominal:     General: Bowel sounds are normal. There is no distension.     Palpations: Abdomen is soft.     Tenderness: There is no abdominal tenderness. There is no rebound.  Musculoskeletal:     Cervical back: Normal range of motion.  Skin:    General: Skin is warm and dry.  Neurological:     Mental Status: She is alert and oriented to person, place, and time.     Coordination: Coordination normal.     Vitals:   10/04/20 1257  BP: 132/80  Pulse: 65  Resp: 18  Temp: 98.4 F (36.9 C)  TempSrc: Oral  SpO2: 98%  Weight: 147 lb 9.6 oz (67 kg)  Height: 5\' 6"  (1.676 m)    This  visit occurred during the SARS-CoV-2 public health emergency.  Safety protocols were in place, including screening questions prior to the visit, additional usage of staff PPE, and extensive cleaning of exam room while observing appropriate contact time as indicated for disinfecting solutions.   Assessment & Plan:

## 2020-10-04 NOTE — Patient Instructions (Addendum)
We have sent in the refill for the dexilant for 1 pill daily.   You can use heat or ice on the jaw to help with the pain.  It is okay to take the meloxicam or tylenol for pain in the hands.

## 2020-10-05 ENCOUNTER — Other Ambulatory Visit: Payer: Self-pay | Admitting: Internal Medicine

## 2020-10-05 MED ORDER — AMLODIPINE BESYLATE 2.5 MG PO TABS: 2.5000 mg | ORAL_TABLET | Freq: Every day | ORAL | 3 refills | Status: DC

## 2020-10-06 NOTE — Assessment & Plan Note (Signed)
We talked about how her symptoms of hand pain are consistent with arthritis and rx meloxicam. Encouraged to be active to help.

## 2020-10-06 NOTE — Assessment & Plan Note (Signed)
Refilled dexilant for daily only as the plan from last visit was for only 2 weeks of BID treatment then she was to resume daily. She is no longer having the chest tightness/pain she was previously having before we did BID dosing so this was successful.

## 2020-10-29 DIAGNOSIS — Z885 Allergy status to narcotic agent status: Secondary | ICD-10-CM | POA: Diagnosis not present

## 2020-10-29 DIAGNOSIS — K649 Unspecified hemorrhoids: Secondary | ICD-10-CM | POA: Diagnosis not present

## 2020-11-17 DIAGNOSIS — Z0100 Encounter for examination of eyes and vision without abnormal findings: Secondary | ICD-10-CM | POA: Diagnosis not present

## 2020-11-17 DIAGNOSIS — H524 Presbyopia: Secondary | ICD-10-CM | POA: Diagnosis not present

## 2020-11-25 ENCOUNTER — Ambulatory Visit: Payer: Self-pay | Admitting: *Deleted

## 2020-11-25 NOTE — Telephone Encounter (Signed)
Positive Covid today. Runny nose only. Reviewed isolation, when to discontinue isolation, Coricidin HBP for nasal symptoms, health precautions and when to seek treatment immediately. Patient voiced understanding.

## 2020-11-30 ENCOUNTER — Other Ambulatory Visit: Payer: Self-pay | Admitting: Internal Medicine

## 2020-12-05 ENCOUNTER — Telehealth: Payer: Self-pay

## 2020-12-05 NOTE — Telephone Encounter (Signed)
pt has stated she is out of her medication Dexlansoprazole and is in need of a refill. Pt can be reached at [phone] if needed.

## 2020-12-05 NOTE — Telephone Encounter (Signed)
Patient has been made aware that she needs to contact her pharmacy for a refill. She received a 90 day supply with 3 refills on 10/04/2020. She verbalized understanding.

## 2021-01-18 ENCOUNTER — Telehealth: Payer: Self-pay

## 2021-01-18 NOTE — Progress Notes (Signed)
    Chronic Care Management Pharmacy Assistant   Name: Monica [name]  MRN: [id number] DOB: 11-17-1940   Reason for Encounter: Disease State General Assessment    Recent office visits:  No recent office visits noted   Recent consult visits:  11/17/20 [place] - No notes available   Hospital visits:   Medication Reconciliation was completed by comparing discharge summary, patient's EMR and Pharmacy list, and upon discussion with patient.  Admitted to the hospital on 10/29/20 due to Hemorrhoids. Discharge date was 10/29/20. Discharged from [facility] emergency department.  New?Medications Started at [facility] Discharge:?? N/a  Medication Changes at Hospital Discharge: N/a  Medications Discontinued at Hospital Discharge: N/a  Medications that remain the same after Hospital Discharge:??  -All other medications will remain the same.     Medications: Outpatient Encounter Medications as of 01/18/2021  Medication Sig   amLODipine (NORVASC) 2.5 MG tablet Take 1 tablet (2.5 mg total) by mouth daily.   aspirin 81 MG tablet Take 81 mg by mouth daily.   atorvastatin (LIPITOR) 20 MG tablet Take 1 tablet (20 mg total) by mouth daily.   Calcium Carbonate-Vitamin D3 600-400 MG-UNIT TABS Take 2 tablets by mouth daily.   DEXILANT 60 MG capsule TAKE 1 CAPSULE BY MOUTH IN THE MORNING AND AT BEDTIME   fluorometholone (FML) 0.1 % ophthalmic suspension Place 1 drop into both eyes daily.   latanoprost (XALATAN) 0.005 % ophthalmic solution Place 1 drop into both eyes at bedtime.   meloxicam (MOBIC) 7.5 MG tablet Take 1 tablet (7.5 mg total) by mouth daily.   Polyethylene Glycol 3350 (MIRALAX PO) Take 17 g by mouth daily.   triamcinolone (KENALOG) 0.025 % ointment  (Patient not taking: Reported on 10/04/2020)   No facility-administered encounter medications on file as of 01/18/2021.    Have you had any problems recently with your health? Patient states that she has no concerns at  this time.  Have you had any problems with your pharmacy? N/a  What issues or side effects are you having with your medications? Patient states that she has no issues with medications at this time.   What would you like me to pass along to [facility] for them to help you with?  N/a  What can we do to take care of you better?  N/a   Reminded patient that she has an upcoming appointment with Linna Hoff on 04/05/21 @ 10:30 am in person.  Star Rating Drugs: Atorvastatin 20 mg last filled 03/30/20 90 DS   Andee Poles, CMA

## 2021-01-25 ENCOUNTER — Encounter: Payer: Self-pay | Admitting: Internal Medicine

## 2021-01-25 ENCOUNTER — Other Ambulatory Visit: Payer: Self-pay

## 2021-01-25 ENCOUNTER — Ambulatory Visit (INDEPENDENT_AMBULATORY_CARE_PROVIDER_SITE_OTHER): Payer: Medicare HMO | Admitting: Internal Medicine

## 2021-01-25 VITALS — BP 122/84 | HR 76 | Temp 98.0°F | Resp 18 | Ht 66.0 in | Wt 147.8 lb

## 2021-01-25 DIAGNOSIS — E785 Hyperlipidemia, unspecified: Secondary | ICD-10-CM | POA: Diagnosis not present

## 2021-01-25 DIAGNOSIS — E119 Type 2 diabetes mellitus without complications: Secondary | ICD-10-CM

## 2021-01-25 DIAGNOSIS — K5909 Other constipation: Secondary | ICD-10-CM | POA: Diagnosis not present

## 2021-01-25 DIAGNOSIS — E1169 Type 2 diabetes mellitus with other specified complication: Secondary | ICD-10-CM

## 2021-01-25 DIAGNOSIS — F5101 Primary insomnia: Secondary | ICD-10-CM | POA: Diagnosis not present

## 2021-01-25 DIAGNOSIS — G4700 Insomnia, unspecified: Secondary | ICD-10-CM | POA: Insufficient documentation

## 2021-01-25 LAB — COMPREHENSIVE METABOLIC PANEL
ALT: 13 U/L (ref 0–35)
AST: 19 U/L (ref 0–37)
Albumin: 4.2 g/dL (ref 3.5–5.2)
Alkaline Phosphatase: 50 U/L (ref 39–117)
BUN: 11 mg/dL (ref 6–23)
CO2: 28 mEq/L (ref 19–32)
Calcium: 9.7 mg/dL (ref 8.4–10.5)
Chloride: 105 mEq/L (ref 96–112)
Creatinine, Ser: 0.96 mg/dL (ref 0.40–1.20)
GFR: 56.66 mL/min — ABNORMAL LOW (ref >60.00)
Glucose, Bld: 103 mg/dL — ABNORMAL HIGH (ref 70–99)
Potassium: 3.9 mEq/L (ref 3.5–5.1)
Sodium: 140 mEq/L (ref 135–145)
Total Bilirubin: 0.9 mg/dL (ref 0.2–1.2)
Total Protein: 7.4 g/dL (ref 6.0–8.3)

## 2021-01-25 LAB — CBC
HCT: 40.1 % (ref 36.0–46.0)
Hemoglobin: 13.1 g/dL (ref 12.0–15.0)
MCHC: 32.7 g/dL (ref 30.0–36.0)
MCV: 93.4 fl (ref 78.0–100.0)
Platelets: 223 10*3/uL (ref 150.0–400.0)
RBC: 4.3 Mil/uL (ref 3.87–5.11)
RDW: 14.8 % (ref 11.5–15.5)
WBC: 4.6 10*3/uL (ref 4.0–10.5)

## 2021-01-25 LAB — MICROALBUMIN / CREATININE URINE RATIO
Creatinine,U: 139.7 mg/dL
Microalb Creat Ratio: 3.1 mg/g (ref 0.0–30.0)
Microalb, Ur: 4.4 mg/dL — ABNORMAL HIGH (ref 0.0–1.9)

## 2021-01-25 LAB — LIPID PANEL
Cholesterol: 206 mg/dL — ABNORMAL HIGH (ref 0–200)
HDL: 59.5 mg/dL (ref >39.00)
LDL Cholesterol: 131 mg/dL — ABNORMAL HIGH (ref 0–99)
NonHDL: 146.81
Total CHOL/HDL Ratio: 3
Triglycerides: 77 mg/dL (ref 0.0–149.0)
VLDL: 15.4 mg/dL (ref 0.0–40.0)

## 2021-01-25 LAB — HEMOGLOBIN A1C: Hgb A1c MFr Bld: 6.9 % — ABNORMAL HIGH (ref 4.6–6.5)

## 2021-01-25 NOTE — Patient Instructions (Signed)
We will check the labs today. 

## 2021-01-25 NOTE — Assessment & Plan Note (Signed)
New complaint over the last month. She used to listen to Cd to fall asleep but this is broken. Now she is using television noise to fall asleep and this works well. Encouraged that non-medication option for sleep is safest. We talked about noise machine, tv noise, meditation, relaxation for sleep.

## 2021-01-25 NOTE — Assessment & Plan Note (Signed)
Foot exam done. Diet controlled currently. She is on statin but not ACE-I or ARB. Checking HgA1c, lipid, microalbumin to creatinine ratio. Reminded about yearly eye exam.

## 2021-01-25 NOTE — Progress Notes (Signed)
   Subjective:   Patient ID: Monica [name], female    DOB: 25-Jan-1941, 80 y.o.   MRN: [id number]  Insomnia  The patent is a 80 YO female coming in for follow up medical conditions.   Review of Systems  Constitutional: Negative.   HENT: Negative.    Eyes: Negative.   Respiratory:  Negative for cough, chest tightness and shortness of breath.   Cardiovascular:  Negative for chest pain, palpitations and leg swelling.  Gastrointestinal:  Negative for abdominal distention, abdominal pain, constipation, diarrhea, nausea and vomiting.  Musculoskeletal: Negative.   Skin: Negative.   Neurological: Negative.   Psychiatric/Behavioral:  The patient has insomnia.    Objective:  Physical Exam Constitutional:      Appearance: She is well-developed.  HENT:     Head: Normocephalic and atraumatic.  Cardiovascular:     Rate and Rhythm: Normal rate and regular rhythm.  Pulmonary:     Effort: Pulmonary effort is normal. No respiratory distress.     Breath sounds: Normal breath sounds. No wheezing or rales.  Abdominal:     General: Bowel sounds are normal. There is no distension.     Palpations: Abdomen is soft.     Tenderness: There is no abdominal tenderness. There is no rebound.  Musculoskeletal:     Cervical back: Normal range of motion.  Skin:    General: Skin is warm and dry.  Neurological:     Mental Status: She is alert and oriented to person, place, and time.     Coordination: Coordination normal.    Vitals:   01/25/21 0934  BP: 122/84  Pulse: 76  Resp: 18  Temp: 98 F (36.7 C)  TempSrc: Oral  SpO2: 99%  Weight: 147 lb 12.8 oz (67 kg)  Height: '5\' 6"'$  (1.676 m)    This visit occurred during the SARS-CoV-2 public health emergency.  Safety protocols were in place, including screening questions prior to the visit, additional usage of staff PPE, and extensive cleaning of exam room while observing appropriate contact time as indicated for disinfecting solutions.   Assessment &  Plan:

## 2021-01-25 NOTE — Assessment & Plan Note (Signed)
We did discuss today how atorvastatin may be worsening this slightly. She is using miralax daily and this is helping well. She is encouraged to continue this. Not due for repeat colonoscopy due to age.

## 2021-01-25 NOTE — Assessment & Plan Note (Signed)
Taking atorvastatin 20 mg daily and checking lipid panel today. Adjust as needed for LDL goal <100.

## 2021-01-30 ENCOUNTER — Telehealth: Payer: Self-pay

## 2021-01-30 NOTE — Telephone Encounter (Signed)
-----   Message from [name], MD sent at 01/27/2021 10:02 PM EDT ----- Normal/stable labs except cholesterol is still higher than goal. Are you taking the liptiro 20 mg daily? If so we need to increase the dose. If not we need you to resume and recheck labs in 3 months.

## 2021-01-30 NOTE — Telephone Encounter (Signed)
Patient says she stopped taking Lipitor a couple weeks ago, she thinks it was causing her trouble going to the bathroom.  Patient is willing to go back on lipitor or other options.  Also informed patient provider was out of the office and it may be a wait for a response.  Patient voiced understanding.

## 2021-02-01 ENCOUNTER — Telehealth: Payer: Self-pay

## 2021-02-01 NOTE — Telephone Encounter (Signed)
Patient has quit takings her atorvastatin and is wondering if she should start back taking them?

## 2021-02-06 NOTE — Telephone Encounter (Signed)
Ok to restart, and also take miralax otc daily prn constipation if this was the issue

## 2021-02-08 NOTE — Telephone Encounter (Signed)
LVM with Dr. Gwynn Burly recommendations.

## 2021-02-10 ENCOUNTER — Other Ambulatory Visit: Payer: Self-pay | Admitting: Internal Medicine

## 2021-02-14 ENCOUNTER — Telehealth: Payer: Self-pay

## 2021-02-14 NOTE — Telephone Encounter (Signed)
Okay to resume lipitor for cholesterol.

## 2021-02-14 NOTE — Telephone Encounter (Signed)
PA has been submitted.  Key: [id number]

## 2021-02-14 NOTE — Telephone Encounter (Signed)
Please advise as [place] has called and stated a PA needs to be submitted for DEXILANT 60 MG capsule as it is not covered by pts insurance and pt has stated she needs the medication.

## 2021-02-15 MED ORDER — ATORVASTATIN CALCIUM 20 MG PO TABS: 20.0000 mg | ORAL_TABLET | Freq: Every day | ORAL | 3 refills | Status: DC

## 2021-02-15 NOTE — Addendum Note (Signed)
Addended by: Thomes Cake on: 02/15/2021 03:08 PM   Modules accepted: Orders

## 2021-02-15 NOTE — Telephone Encounter (Signed)
Spoke with the patient ad she verbalized understanding. New script has been sent to her pharmacy on file

## 2021-04-04 NOTE — Progress Notes (Signed)
Chronic Care Management Pharmacy Note  04/05/2021 Name:  Monica [name] MRN:  [id number] DOB:  [birth year]  Summary: -Patient was in to see PCP today, has been switched from atorvastatin to pravastatin due to constipation issues which patient attributes to atorvastatin  -Patient reports that dexliant PA has been approved, no issues with copay of dexliant or any of her other medications  -BP well controlled in office, patient has not been checking at home as of late  Recommendations/Changes made from today's visit: -Recommending no additional changes to medications aside from replacing atorvastatin with pravastatsin -counseled patient on COVID booster, Tdap, Pneumonia, and shingles vaccines which are due   Subjective: Monica [name] is an 80 y.o. year old female who is a primary patient of [name], MD.  The CCM team was consulted for assistance with disease management and care coordination needs.    Engaged with patient face to face for follow up visit in response to provider referral for pharmacy case management and/or care coordination services.   Consent to Services:  The patient was given the following information about Chronic Care Management services today, agreed to services, and gave verbal consent: 1. CCM service includes personalized support from designated clinical staff supervised by the primary care provider, including individualized plan of care and coordination with other care providers 2. 24/7 contact phone numbers for assistance for urgent and routine care needs. 3. Service will only be billed when office clinical staff spend 20 minutes or more in a month to coordinate care. 4. Only one practitioner may furnish and bill the service in a calendar month. 5.The patient may stop CCM services at any time (effective at the end of the month) by phone call to the office staff. 6. The patient will be responsible for cost sharing (co-pay) of up to 20% of the service fee  (after annual deductible is met). Patient agreed to services and consent obtained.  Patient Care Team: [name], MD as PCP - General (Internal Medicine) Newton Pigg, MD (Obstetrics and Gynecology) Katy Apo, MD (Ophthalmology) Sable Feil, MD as Consulting Physician (Gastroenterology) Charlton Haws, [facility] as Pharmacist (Pharmacist) Delice Bison Darnelle Maffucci, [facility] (Pharmacist) Tomasa Blase, [facility] as Pharmacist (Pharmacist)  Recent office visits: 01/25/2021 - Dr. Sharlet Salina - f/u - patient to restart atorvastatin - will use miralax as needed for constipation   Recent consult visits: 11/17/2020 - Dr. Rosana Hoes - notes not available   Hospital visits: 10/29/2020 - To ED for evaluation of hemorrhoids - tretaed with anusol cream   Objective:  Lab Results  Component Value Date   CREATININE 0.96 01/25/2021   BUN 11 01/25/2021   GFR 56.66 (L) 01/25/2021   GFRNONAA >60 10/31/2016   GFRAA >60 10/31/2016   NA 140 01/25/2021   K 3.9 01/25/2021   CALCIUM 9.7 01/25/2021   CO2 28 01/25/2021   GLUCOSE 103 (H) 01/25/2021    Lab Results  Component Value Date/Time   HGBA1C 6.9 (H) 01/25/2021 09:55 AM   HGBA1C 6.7 (H) 12/15/2019 01:48 PM   GFR 56.66 (L) 01/25/2021 09:55 AM   GFR 75.41 06/12/2019 09:09 AM   MICROALBUR 4.4 (H) 01/25/2021 09:55 AM   MICROALBUR 1.2 12/15/2019 01:48 PM    Last diabetic Eye exam:  Lab Results  Component Value Date/Time   HMDIABEYEEXA No Retinopathy 01/23/2018 12:00 AM    Last diabetic Foot exam:  No results found for: HMDIABFOOTEX   Lab Results  Component Value Date   CHOL 206 (H)  01/25/2021   HDL 59.50 01/25/2021   LDLCALC 131 (H) 01/25/2021   TRIG 77.0 01/25/2021   CHOLHDL 3 01/25/2021    Hepatic Function Latest Ref Rng & Units 01/25/2021 12/15/2019 06/12/2019  Total Protein 6.0 - 8.3 g/dL 7.4 7.6 7.5  Albumin 3.5 - 5.2 g/dL 4.2 - 4.4  AST 0 - 37 U/L _0 ALT 0 - 35 U/L _1 Alk Phosphatase 39 - 117 U/L 50 - 57  Total  Bilirubin 0.2 - 1.2 mg/dL 0.9 0.8 0.8  Bilirubin, Direct 0.0 - 0.3 mg/dL - - -    Lab Results  Component Value Date/Time   TSH 1.70 06/12/2018 01:39 PM   TSH 2.33 11/07/2017 09:13 AM    CBC Latest Ref Rng & Units 01/25/2021 12/15/2019 06/12/2019  WBC 4.0 - 10.5 K/uL 4.6 5.6 3.3(L)  Hemoglobin 12.0 - 15.0 g/dL 13.1 13.5 13.0  Hematocrit 36.0 - 46.0 % 40.1 42.5 39.9  Platelets 150.0 - 400.0 K/uL 223.0 242 224.0    Lab Results  Component Value Date/Time   VD25OH 35.89 06/12/2018 01:39 PM   VD25OH 43.00 11/07/2017 09:13 AM    Clinical ASCVD: No  The 10-year ASCVD risk score (Arnett DK, et al., 2019) is: 38%   Values used to calculate the score:     Age: 57 years     Sex: Female     Is Non-Hispanic African American: Yes     Diabetic: Yes     Tobacco smoker: No     Systolic Blood Pressure: 808 mmHg     Is BP treated: Yes     HDL Cholesterol: 59.5 mg/dL     Total Cholesterol: 206 mg/dL    Depression screen [facility] 2/9 06/10/2020 09/19/2018 08/12/2017  Decreased Interest 0 0 0  Down, Depressed, Hopeless 0 0 1  PHQ - 2 Score 0 0 1  Altered sleeping - 0 0  Tired, decreased energy - 0 0  Change in appetite - 0 0  Feeling bad or failure about yourself  - 0 0  Trouble concentrating - 0 0  Moving slowly or fidgety/restless - 0 0  Suicidal thoughts - 0 0  PHQ-9 Score - 0 1  Difficult doing work/chores - Not difficult at all Not difficult at all      Social History   Tobacco Use  Smoking Status Never  Smokeless Tobacco Never   BP Readings from Last 3 Encounters:  04/05/21 124/76  01/25/21 122/84  10/04/20 132/80   Pulse Readings from Last 3 Encounters:  04/05/21 73  01/25/21 76  10/04/20 65   Wt Readings from Last 3 Encounters:  04/05/21 147 lb 9.6 oz (67 kg)  01/25/21 147 lb 12.8 oz (67 kg)  10/04/20 147 lb 9.6 oz (67 kg)   BMI Readings from Last 3 Encounters:  04/05/21 23.82 kg/m  01/25/21 23.86 kg/m  10/04/20 23.82 kg/m    Assessment/Interventions: Review of  patient past medical history, allergies, medications, health status, including review of consultants reports, laboratory and other test data, was performed as part of comprehensive evaluation and provision of chronic care management services.   SDOH:  (Social Determinants of Health) assessments and interventions performed: Yes  SDOH Screenings   Alcohol Screen: Not on file  Depression (PHQ2-9): Low Risk    PHQ-2 Score: 0  Financial Resource Strain: Not on file  Food Insecurity: Not on file  Housing: Not on file  Physical Activity: Not on file  Social Connections: Not on  file  Stress: Not on file  Tobacco Use: Low Risk    Smoking Tobacco Use: Never   Smokeless Tobacco Use: Never   Passive Exposure: Not on file  Transportation Needs: Not on file    CCM Care Plan  Allergies  Allergen Reactions   Codeine     REACTION: Upset stomach   Hydrochlorothiazide     REACTION: Nausea and headache    Medications Reviewed Today     Reviewed by Tomasa Blase, [facility] (Pharmacist) on 04/05/21 at [place] List Status: <None>   Medication Order Taking? Sig Documenting Provider Last Dose Status Informant  amLODipine (NORVASC) 2.5 MG tablet [id number] No Take 1 tablet (2.5 mg total) by mouth daily. [name], MD Taking Active   aspirin 81 MG tablet [id number] No Take 81 mg by mouth daily. [provider] Taking Active Self  Calcium Carbonate-Vitamin D3 600-400 MG-UNIT TABS [id number] No Take 2 tablets by mouth daily. [provider] Taking Active   DEXILANT 60 MG capsule [id number] No TAKE 1 CAPSULE BY MOUTH IN THE MORNING AND AT BEDTIME [name], MD Taking Active   fluorometholone (FML) 0.1 % ophthalmic suspension [id number] No Place 1 drop into both eyes daily. [provider] Taking Active   latanoprost (XALATAN) 0.005 % ophthalmic solution [id number] No Place 1 drop into both eyes at bedtime. [provider] Taking Active   meloxicam  (MOBIC) 7.5 MG tablet [id number] No Take 1 tablet (7.5 mg total) by mouth daily. [name], MD Taking Active   Polyethylene Glycol 3350 (MIRALAX PO) [id number] No Take 17 g by mouth daily. [provider] Taking Active   pravastatin (PRAVACHOL) 20 MG tablet [id number]  Take 1 tablet (20 mg total) by mouth daily. 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   Chronic constipation 02/27/2011   GERD (gastroesophageal reflux disease) 02/27/2011   Hyperlipidemia associated with type 2 diabetes mellitus ([place]) 01/14/2009   ESOPHAGEAL STRICTURE 11/11/2008   Essential hypertension 12/01/2007   OSTEOPENIA 09/12/2007   Unspecified glaucoma 05/07/2007   Arthropathy 05/07/2007    Immunization History  Administered Date(s) Administered   PFIZER(Purple Top)SARS-COV-2 Vaccination 07/04/2019, 07/27/2019, 03/14/2020    Conditions to be addressed/monitored:  Hypertension, Hyperlipidemia, Diabetes, GERD and Open-angled Glaucoma   Care Plan : [place]  Updates made by Tomasa Blase, [place] since 04/05/2021 12:00 AM     Problem: HTN, DM2, Osteopenia, HLD, Glaucoma, GERD   Priority: High  Onset Date: 10/04/2020     Goal: Disease Management   Start Date: 10/04/2020  Expected End Date: 04/06/2021  This Visit's Progress: On track  Recent Progress: On track  Priority: High  Note:   Current Barriers:  Unable to independently monitor therapeutic efficacy Does not adhere to prescribed medication regimen  Pharmacist Clinical Goal(s):  Patient will achieve  adherence to monitoring guidelines and medication adherence to achieve therapeutic efficacy maintain control of BP and A1c  as evidenced by continued controlled BP when checked at home and in office, and A1c remaining <7.0%  through collaboration with PharmD and provider.   Interventions: 1:1 collaboration with [name], MD regarding development and update of comprehensive plan of care as evidenced by provider attestation and co-signature Inter-disciplinary care team collaboration (see longitudinal plan of care) Comprehensive medication review performed; medication list updated in electronic medical record  Hypertension (BP goal <130/80) -Controlled -Current treatment: Amlodipine 2.74m - 1 tablet daily   -Medications previously tried: hctz  -Current home readings: has not been checking at home  BP Readings from Last 3 Encounters:  04/05/21 124/76  01/25/21 122/84  10/04/20 132/80  -Current dietary habits: reports that she uses salt in moderation, has little caffeine throughout the day  -Current exercise habits: n/a -Denies hypotensive/hypertensive symptoms -Educated on BP goals and benefits of medications for prevention of heart attack, stroke and kidney damage; Daily salt intake goal < 2300 mg; Symptoms of hypotension and importance of maintaining adequate hydration; -Counseled to monitor BP at home daily if able, document, and provide log at future appointments -Counseled on diet and exercise extensively Recommended to continue current medication  Hyperlipidemia: (LDL goal < 70 mg/dL) - Not ideally controlled - patient has not been consistently taking atorvastatin due to constipation issues it caused  Lab Results  Component Value Date   LDLCALC 131 (H) 01/25/2021  -Current treatment: Pravastatin 267mdaily  ASA 8156maily   -Medications previously tried: n/a  -Current dietary patterns: reports to diet low in fried / fatty foods  -Educated on Cholesterol goals;   Benefits of statin for ASCVD risk reduction; Importance of limiting foods high in cholesterol; -Counseled on diet and exercise extensively Recommended for patient to start pravastatin - will have member of care team reach out to patient in 2 months to ensure no issues after starting   Diabetes (A1c goal <7%) -Controlled Lab Results  Component Value Date   HGBA1C 6.9 (H) 01/25/2021  -Current medications: N/a - DM at this time is diet controlled  -Medications previously tried: n/a  -Current home glucose readings fasting glucose: unknown at this time - patient does not test BG -Current exercise: nothing scheduled  -Educated on A1c and blood sugar goals; Complications of diabetes including kidney damage, retinal damage, and cardiovascular disease; -Counseled to check feet daily and get yearly eye exams -Counseled on diet and exercise extensively  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) -Controlled -Current treatment  Latanoprost 0.005% solution  - 1 drop in each eye daily  Fluoromethalone 0.1% solution  - 1 drop in each eye daily  -Medications previously tried: Refresh eye drops   -Recommended to continue current medication   GERD (Goal: Acid control / prevention of flares ) -Controlled -Current treatment  Dexlansoprazole 20m7mily -Medications previously tried: n/a  -Recommended to continue current medication   Osteoporosis / Osteopenia (Goal Promotion of bone health / prevention of disease ) -Not ideally controlled -Last DEXA Scan: 08/11/2013   T-Score femoral neck: -1.6  T-Score total hip: -1.6  T-Score lumbar spine: -0.7  T-Score forearm radius: n/a  10-year probability of major osteoporotic fracture: 5.7%  10-year probability of hip fracture: 1.4%   *per FRAX tool calculation -Patient is a candidate for pharmacologic treatment due to femoral neck t score of -1.6 - indicating osteopenia, would recommend supplementation with Ca/  Vit D -Current treatment  Calcium-Vitamin D - 600mg46munits- 1 tablets daily  -Medications previously tried: Calcium and VitD  -Counseled on diet and exercise extensively   Health Maintenance -Current therapy:   Miralax -  17g daily prn  Meloxicam 7.83m daily  Triamcinolone 0.025% ointment  -Educated on Cost vs benefit of each product must be carefully weighed by individual consumer -Patient is satisfied with current therapy and denies issues -Recommended to continue current medication   Patient Goals/Self-Care Activities Patient will:  - take medications as prescribed  Follow Up Plan: telephone appointment with care management team member scheduled for:  6 months The patient has been provided with contact information for the care management team and has been advised to call with any health related questions or concerns.          Medication Assistance: None required.  Patient affirms current coverage meets needs.  [place]:  [facility] [place]Phone: [phone]: [phone]  Uses pill box? No Pt endorses 75% compliance - not taking atorvastatin   Care Plan and Follow Up Patient Decision:  Patient agrees to Care Plan and Follow-up.  Plan: Face to Face appointment with care management team member scheduled for: 6 months  and The patient has been provided with contact information for the care management team and has been advised to call with any health related questions or concerns.   DTomasa Blase PharmD Clinical Pharmacist, [place]

## 2021-04-05 ENCOUNTER — Encounter: Payer: Self-pay | Admitting: Internal Medicine

## 2021-04-05 ENCOUNTER — Other Ambulatory Visit: Payer: Self-pay

## 2021-04-05 ENCOUNTER — Ambulatory Visit (INDEPENDENT_AMBULATORY_CARE_PROVIDER_SITE_OTHER): Payer: Medicare HMO

## 2021-04-05 ENCOUNTER — Ambulatory Visit (INDEPENDENT_AMBULATORY_CARE_PROVIDER_SITE_OTHER): Payer: Medicare HMO | Admitting: Internal Medicine

## 2021-04-05 VITALS — BP 124/76 | HR 73 | Resp 18 | Ht 66.0 in | Wt 147.6 lb

## 2021-04-05 DIAGNOSIS — Z0000 Encounter for general adult medical examination without abnormal findings: Secondary | ICD-10-CM

## 2021-04-05 DIAGNOSIS — E119 Type 2 diabetes mellitus without complications: Secondary | ICD-10-CM

## 2021-04-05 DIAGNOSIS — K5909 Other constipation: Secondary | ICD-10-CM | POA: Diagnosis not present

## 2021-04-05 DIAGNOSIS — E785 Hyperlipidemia, unspecified: Secondary | ICD-10-CM

## 2021-04-05 DIAGNOSIS — E1169 Type 2 diabetes mellitus with other specified complication: Secondary | ICD-10-CM

## 2021-04-05 DIAGNOSIS — I10 Essential (primary) hypertension: Secondary | ICD-10-CM

## 2021-04-05 MED ORDER — PRAVASTATIN SODIUM 20 MG PO TABS: 20.0000 mg | ORAL_TABLET | Freq: Every day | ORAL | 3 refills | Status: DC

## 2021-04-05 NOTE — Progress Notes (Signed)
Subjective:   Patient ID: Monica [name], female    DOB: [birth year], 80 y.o.   MRN: [id number]  HPI Here for medicare wellness and physical, no new complaints. Please see A/P for status and treatment of chronic medical problems.   Diet: DM since diabetic Physical activity: sedentary, walking regularly Depression/mood screen: negative Hearing: intact to whispered voice, mild loss Visual acuity: grossly normal with lens, performs annual eye exam  ADLs: capable Fall risk: none Home safety: good Cognitive evaluation: intact to orientation, naming, recall and repetition EOL planning: adv directives discussed  [place] Office Visit from 06/10/2020 in [place] at [facility]  PHQ-2 Total Score 0       [place] from 09/19/2018 in [place]  PHQ-9 Total Score 0      Fall Risk 04/19/2016 08/12/2017 09/19/2018 08/31/2019 10/04/2020  Falls in the past year? No No 0 0 0  Was there an injury with Fall? - - 1 0 0  Fall Risk Category Calculator - - 1 0 0  Fall Risk Category - - Low Low Low  Patient Fall Risk Level - - - Low fall risk -  Patient at Risk for Falls Due to - - - - -  Fall risk Follow up - - Falls prevention discussed - -    I have personally reviewed and have noted 1. The patient's medical and social history - reviewed today no changes 2. Their use of alcohol, tobacco or illicit drugs 3. Their current medications and supplements 4. The patient's functional ability including ADL's, fall risks, home safety risks and hearing or visual impairment. 5. Diet and physical activities 6. Evidence for depression or mood disorders 7. Care team reviewed and updated 8.  The patient is not on an opioid pain medication.  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   DEPRESSION, SITUATIONAL    Diabetes mellitus ([place]) dx 02/2013   GERD (gastroesophageal reflux disease)    w/ HH (EGD 2010)   GLAUCOMA    Hypertension    Iron deficiency anemia due to chronic blood loss 02/05/2014   LOW BACK PAIN, CHRONIC    Mixed hyperlipidemia    OSTEOPENIA    Stricture and stenosis of esophagus 2006   Past Surgical History:  Procedure Laterality Date   BACK SURGERY     COLONOSCOPY  09/21/2013   [name]   ESOPHAGEAL MANOMETRY N/A 02/09/2013   Procedure: ESOPHAGEAL MANOMETRY (EM);  Surgeon: Sable Feil, MD;  Location: [facility];  Service: Endoscopy;  Laterality: N/A;   EYE SURGERY     frozen shoulder surgery Left    HEMORROIDECTOMY     HYSTEROSCOPY WITH D & C  07/20/2011   Procedure: DILATATION AND CURETTAGE /HYSTEROSCOPY;  Surgeon: Melina Schools, MD;  Location: [facility];  Service: Gynecology;  Laterality: N/A;   POLYPECTOMY  07/20/2011   Procedure: POLYPECTOMY;  Surgeon: Melina Schools, MD;  Location: [facility];  Service: Gynecology;  Laterality: N/A;   POLYPECTOMY     ROTATOR CUFF REPAIR  right    TUBAL LIGATION     Family History  Problem Relation Age of Onset   Heart failure Father    Diabetes Mother    Breast cancer Sister    Colon cancer Sister 49   Diabetes Brother    Esophageal cancer Neg Hx    Rectal cancer Neg Hx    Stomach cancer Neg Hx    Colon polyps Neg Hx    Review of Systems  Constitutional: Negative.   HENT: Negative.    Eyes: Negative.   Respiratory:  Negative for cough, chest tightness and shortness of breath.   Cardiovascular:  Negative for chest pain, palpitations and leg swelling.  Gastrointestinal:  Positive for constipation.  Negative for abdominal distention, abdominal pain, diarrhea, nausea and vomiting.  Musculoskeletal: Negative.   Skin: Negative.   Neurological: Negative.   Psychiatric/Behavioral: Negative.     Objective:  Physical Exam Constitutional:      Appearance: She is well-developed.  HENT:     Head: Normocephalic and atraumatic.  Cardiovascular:     Rate and Rhythm: Normal rate and regular rhythm.  Pulmonary:     Effort: Pulmonary effort is normal. No respiratory distress.     Breath sounds: Normal breath sounds. No wheezing or rales.  Abdominal:     General: Bowel sounds are normal. There is no distension.     Palpations: Abdomen is soft.     Tenderness: There is no abdominal tenderness. There is no rebound.  Musculoskeletal:     Cervical back: Normal range of motion.  Skin:    General: Skin is warm and dry.  Neurological:     Mental Status: She is alert and oriented to person, place, and time.     Coordination: Coordination normal.    Vitals:   04/05/21 0932  BP: 124/76  Pulse: 73  Resp: 18  SpO2: 98%  Weight: 147 lb 9.6 oz (67 kg)  Height: 5\' 6"  (1.676 m)   This visit occurred during the SARS-CoV-2 public health emergency.  Safety protocols were in place, including screening questions prior to the visit, additional usage of staff PPE, and extensive cleaning of exam room while observing appropriate contact time as indicated for disinfecting solutions.   Assessment & Plan:

## 2021-04-05 NOTE — Assessment & Plan Note (Signed)
BP at goal on amlodipine 2.5 mg daily. Recent labs reviewed and appropriate.

## 2021-04-05 NOTE — Assessment & Plan Note (Signed)
Reviewed lab results with her and she is up to date on eye exam.

## 2021-04-05 NOTE — Patient Instructions (Addendum)
We will stop the atorvastatin and start a different cholesterol medicine pravastatin to start about 1-2 weeks after stopping the atorvastatin.   Think about the pneumonia and covid-19 vaccine.

## 2021-04-05 NOTE — Assessment & Plan Note (Signed)
Flu shot declines. Covid-19 booster encouraged. Pneumonia declines. Shingrix declines. Tetanus declines. Colonoscopy up to date due 2023. Mammogram aged out future, pap smear aged out and dexa declines future last 2015. Counseled about sun safety and mole surveillance. Counseled about the dangers of distracted driving. Given 10 year screening recommendations.

## 2021-04-05 NOTE — Patient Instructions (Signed)
Visit Information  Following are the goals we discussed today:   Track and Manage My Blood Pressure - HTN  Timeframe:  Long-Range Goal Priority:  High Start Date:  10/04/2020                     Expected End Date:  04/06/2022                    Follow Up Date 10/04/2021   - check blood pressure 3 times per week - choose a place to take my blood pressure (home, clinic or office, retail store) - write blood pressure results in a log or diary    Why is this important?   You won't feel high blood pressure, but it can still hurt your blood vessels.  High blood pressure can cause heart or kidney problems. It can also cause a stroke.  Making lifestyle changes like losing a little weight or eating less salt will help.  Checking your blood pressure at home and at different times of the day can help to control blood pressure.  If the doctor prescribes medicine remember to take it the way the doctor ordered.  Call the office if you cannot afford the medicine or if there are questions about it.     Plan: Telephone follow up appointment with care management team member scheduled for:  6 months The patient has been provided with contact information for the care management team and has been advised to call with any health related questions or concerns.   Tomasa Blase, PharmD Clinical Pharmacist, [facility]    Please call the care guide team at [phone] if you need to cancel or reschedule your appointment.   The patient verbalized understanding of instructions, educational materials, and care plan provided today and declined offer to receive copy of patient instructions, educational materials, and care plan.

## 2021-04-05 NOTE — Assessment & Plan Note (Signed)
She does feel that her atorvastatin is causing constipation so we will stop and prescribe pravastatin 20 mg daily instead.

## 2021-04-05 NOTE — Assessment & Plan Note (Signed)
She is taking miralax and this is helping slightly. She does feel that her cholesterol medicine is causing some of this so we will D/C and change this.

## 2021-04-12 DIAGNOSIS — E785 Hyperlipidemia, unspecified: Secondary | ICD-10-CM | POA: Diagnosis not present

## 2021-04-12 DIAGNOSIS — E1169 Type 2 diabetes mellitus with other specified complication: Secondary | ICD-10-CM | POA: Diagnosis not present

## 2021-04-12 DIAGNOSIS — I10 Essential (primary) hypertension: Secondary | ICD-10-CM

## 2021-05-17 ENCOUNTER — Encounter: Payer: Self-pay | Admitting: Nurse Practitioner

## 2021-05-17 ENCOUNTER — Other Ambulatory Visit: Payer: Self-pay

## 2021-05-17 ENCOUNTER — Telehealth: Payer: Self-pay | Admitting: Nurse Practitioner

## 2021-05-17 ENCOUNTER — Ambulatory Visit
Admission: EM | Admit: 2021-05-17 | Discharge: 2021-05-17 | Disposition: A | Discharge status: Home / Self Care | Payer: Medicare Other | Attending: Family Medicine | Admitting: Family Medicine

## 2021-05-17 ENCOUNTER — Encounter (HOSPITAL_COMMUNITY): Payer: Self-pay | Admitting: Hematology and Oncology

## 2021-05-17 ENCOUNTER — Ambulatory Visit: Payer: Commercial Managed Care - HMO | Admitting: Nurse Practitioner

## 2021-05-17 VITALS — BP 110/66 | HR 99 | Ht 66.0 in | Wt 145.0 lb

## 2021-05-17 DIAGNOSIS — R9431 Abnormal electrocardiogram [ECG] [EKG]: Secondary | ICD-10-CM | POA: Diagnosis not present

## 2021-05-17 DIAGNOSIS — Z86010 Personal history of colonic polyps: Secondary | ICD-10-CM | POA: Diagnosis not present

## 2021-05-17 DIAGNOSIS — R002 Palpitations: Secondary | ICD-10-CM

## 2021-05-17 DIAGNOSIS — K648 Other hemorrhoids: Secondary | ICD-10-CM

## 2021-05-17 NOTE — Progress Notes (Signed)
05/17/2021 Monica CHERN [id number] 1940/09/22   Chief Complaint:  Hemorrhoids, constipation   History of Present Illness: Monica [name] is a 81 year old female with a past medical history of DM II, IDA, GERD, constipation and colon polyps. She is followed by Dr. Havery Moros. She presents to our office today for further evaluation regarding hemorrhoids and constipation. She developed constipation after she was started on Atorvastatin then was switched to Pravastatin but still had constipation issues.  She started MiraLAX which she takes every morning and her constipation resolved.  She passes a normal firm brown bowel movement once or twice daily as long as she takes MiraLAX.  If she skips a dose or 2 of MiraLAX she develops harder pellet-like stools and strains.  She sometimes feels her hemorrhoids swell and pop out with infrequent scant amount of bright red blood on the toilet tissue which might occur once every few months or sometimes occurs a few times in a few weeks.  No significant rectal bleeding.  She has mild central lower abdominal discomfort when constipated and this pain resolves after she passes a bowel movement.  No fever, sweats or chills.  No weight loss.  Her most recent colonoscopy was 10/29/2018 and 4 tubular adenomatous polyps were removed from the colon.  She was advised by Dr. Havery Moros to repeat a colonoscopy 10/2021.  On exam today, her heart rhythm is irregular with a controlled rate.  She denies having any palpitations, dizziness or shortness of breath.  She sometimes has left chest pain when she sleeps on her left side.  No chest pain at this time.  Her stress level is elevated as she worries about her son who has cancer and her other son has other issues.   CBC Latest Ref Rng & Units 01/25/2021 12/15/2019 06/12/2019  WBC 4.0 - 10.5 K/uL 4.6 5.6 3.3(L)  Hemoglobin 12.0 - 15.0 g/dL 13.1 13.5 13.0  Hematocrit 36.0 - 46.0 % 40.1 42.5 39.9  Platelets 150.0 - 400.0 K/uL 223.0  242 224.0    CMP Latest Ref Rng & Units 01/25/2021 12/15/2019 06/12/2019  Glucose 70 - 99 mg/dL 103(H) 96 134(H)  BUN 6 - 23 mg/dL 11 17 12   Creatinine 0.40 - 1.20 mg/dL 0.96 0.96(H) 0.88  Sodium 135 - 145 mEq/L 140 141 140  Potassium 3.5 - 5.1 mEq/L 3.9 4.5 3.8  Chloride 96 - 112 mEq/L 105 106 104  CO2 19 - 32 mEq/L 28 29 30   Calcium 8.4 - 10.5 mg/dL 9.7 10.0 9.7  Total Protein 6.0 - 8.3 g/dL 7.4 7.6 7.5  Total Bilirubin 0.2 - 1.2 mg/dL 0.9 0.8 0.8  Alkaline Phos 39 - 117 U/L 50 - 57  AST 0 - 37 U/L 19 20 19   ALT 0 - 35 U/L 13 14 16       Colonoscopy 10/29/18 - The perianal and digital rectal examinations were normal. - A 3 mm polyp was found in the cecum. The polyp was sessile. The polyp was removed with a cold snare. Resection and retrieval were complete. - A 2 mm polyp was found in the transverse colon. The polyp was sessile. The polyp was removed with a cold snare. Resection and retrieval were complete. - A 2 mm polyp was found in the descending colon. The polyp was sessile. The polyp was removed with a cold snare. Resection and retrieval were complete. - A 4 to 5 mm polyp was found in the sigmoid colon. The polyp was sessile. The polyp  was removed with a cold snare. Resection and retrieval were complete. - Internal hemorrhoids were found during retroflexion, with suspected stigmata of prior banding noted. - The exam was otherwise without abnormality - 3 year recall  Path report showed tubular adenomatous polyps   Current Outpatient Medications on File Prior to Visit  Medication Sig Dispense Refill   amLODipine (NORVASC) 2.5 MG tablet Take 1 tablet (2.5 mg total) by mouth daily. 90 tablet 3   aspirin 81 MG tablet Take 81 mg by mouth daily.     Calcium Carbonate-Vitamin D3 600-400 MG-UNIT TABS Take 2 tablets by mouth daily.     DEXILANT 60 MG capsule TAKE 1 CAPSULE BY MOUTH IN THE MORNING AND AT BEDTIME 180 capsule 0   latanoprost (XALATAN) 0.005 % ophthalmic solution Place 1  drop into both eyes at bedtime.     meloxicam (MOBIC) 7.5 MG tablet Take 1 tablet (7.5 mg total) by mouth daily. 90 tablet 3   phenylephrine-shark liver oil-mineral oil-petrolatum (PREPARATION H) 0.25-14-74.9 % rectal ointment Place 1 application rectally 2 (two) times daily as needed for hemorrhoids.     Polyethylene Glycol 3350 (MIRALAX PO) Take 17 g by mouth daily.     pravastatin (PRAVACHOL) 20 MG tablet Take 1 tablet (20 mg total) by mouth daily. 90 tablet 3   triamcinolone (KENALOG) 0.025 % ointment Apply topically as needed.     No current facility-administered medications on file prior to visit.   Allergies  Allergen Reactions   Codeine     REACTION: Upset stomach   Hydrochlorothiazide     REACTION: Nausea and headache   Current Medications, Allergies, Past Medical History, Past Surgical History, Family History and Social History were reviewed in [employer] record.  Review of Systems:   Constitutional: Negative for fever, sweats, chills or weight loss.  Respiratory: Negative for shortness of breath.   Cardiovascular: Negative for chest pain, palpitations and leg swelling.  Gastrointestinal: See HPI.  Musculoskeletal: Negative for back pain or muscle aches.  Neurological: Negative for dizziness, headaches or paresthesias.   Physical Exam: BP 110/66   Pulse 99   Ht 5\' 6"  (1.676 m)   Wt 145 lb (65.8 kg)   SpO2 99%   BMI 23.40 kg/m  General: 81 year old female in no acute distress. Head: Normocephalic and atraumatic. Eyes: No scleral icterus. Conjunctiva pink . Ears: Normal auditory acuity. Mouth: Upper and lower dentures.  No ulcers or lesions.  Lungs: Clear throughout to auscultation. Heart: Irregular rhythm, regular rate.  No murmur. Abdomen: Soft, nontender and nondistended. No masses or hepatomegaly. Normal bowel sounds x 4 quadrants.  Rectal: No external hemorrhoids or fissure.  Small prolapsed internal hemorrhoid inflamed without active  bleeding.  No rectal prolapse assessed when patient bears down. No stool or mass in the rectal vault.  Melissa CMA present during exam. Musculoskeletal: Symmetrical with no gross deformities. Extremities: No edema. Neurological: Alert oriented x 4. No focal deficits.  Psychological: Alert and cooperative. Normal mood and affect  Assessment and Recommendations:  87) 81 year old female with constipation -MiraLAX nightly, may take twice daily if needed.  Okay to take MiraLAX indefinitely. -Drink 6 to 8 glasses of water daily -Fiber diet recommended  2) Internal hemorrhoids with mild prolapse, infrequent rectal bleeding likely from hemorrhoids -MiraLAX as noted above -Apply a small amount of Desitin inside the anal opening and to the external anal area tid as needed for anal or hemorrhoidal irritation/bleeding.  -Instructed patient to use Vaseline to  push prolapsed hemorrhoid back inside the anorectum as needed  3) History of 4 tubular adenomatous polyps removed from the colon per colonoscopy 10/2018. Recall colonoscopy 10/2021. -Patient to follow-up with Dr. Havery Moros April 2023 to further discuss if a follow-up colon polyp surveillance colonoscopy warranted  4) irregular heart rhythm on exam today.  Patient has chest pain only when she lays on her left side. -I advised patient to contact her PCP today regarding irregular heart rhythm, recommend further evaluation including EKG  -Patient was instructed to go to the ED if she developed any chest pain, palpitations, dizziness or shortness of breath  She will contact our if office if her symptoms worsen

## 2021-05-17 NOTE — Discharge Instructions (Signed)
If your symptoms worsen at any time please go to the emergency department for further evaluation.  Call the cardiologist and have an appointment as soon as possible.

## 2021-05-17 NOTE — Progress Notes (Signed)
I contacted the patient, she will go to the urgent care clinic in [place] to get an EKG.  Further follow-up and management per her PCP.

## 2021-05-17 NOTE — ED Triage Notes (Signed)
Patient states that her PCP sent her here for an EKG and call him back at the office.   Patient states she feels fine today. No chest pain or SOB today.

## 2021-05-17 NOTE — Telephone Encounter (Signed)
Patient called asking we send a referral to see a cardiologist to [address] in [place].

## 2021-05-17 NOTE — Patient Instructions (Signed)
If you are age 79 or older, your body mass index should be between 23-30. Your Body mass index is 23.4 kg/m. If this is out of the aforementioned range listed, please consider follow up with your Primary Care Provider.  The  GI providers would like to encourage you to use [facility] to communicate with providers for non-urgent requests or questions.  Due to long hold times on the telephone, sending your provider a message by [facility] may be faster and more efficient way to get a response. Please allow 48 business hours for a response.  Please remember that this is for non-urgent requests/questions.  RECOMMENDATIONS: Contact your primary care provider today regarding irregular heat rhythm on exam today, recommend EKG. Go to the emergency room if chest pain, palpitations or dizziness occurs. Miralax- Dissolve one capful in 8 ounces of water and drink before bed. Desitin: Apply a small amount to the external and internal anal area three times a day as needed. Follow up in April with Dr. Havery Moros.  It was great seeing you today! Thank you for entrusting me with your care and choosing [facility].  [name], CRNP

## 2021-05-17 NOTE — ED Provider Notes (Signed)
[facility]    CSN: [id number] Arrival date & time: 05/17/21  1247      History   Chief Complaint Chief Complaint  Patient presents with   Abnormal ECG    PCP sent her here to get an EKG    HPI Monica [name] is a 80 y.o. female.   Patient presenting today at the request of her GI specialist due to an abnormal heart rhythm.  She states she was at her appointment earlier in auscultation the provider heard some abnormal heartbeats.  It was suggested that she call her primary care provider for further evaluation into this and EKG.  She called her primary care provider who told her to come to urgent care.  She denies any chest pain, shortness of breath, dizziness, nausea, fatigue.  States for the past month or so she has been having positional flutters when laying on one side but they resolve when she turns a different way in bed.  She states she had not paid any attention as she felt it was likely stress related due to numerous stressors currently.  She has not been trying anything for her symptoms.  Denies any known history of heart problems apart from hypertension controlled on amlodipine, does have a history of diabetes, hyperlipidemia, GERD, esophageal stricture.  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   DEPRESSION, SITUATIONAL    GERD (gastroesophageal reflux disease)    w/ HH (EGD 2010)   GLAUCOMA    Hypertension    Iron deficiency anemia due to chronic blood loss 02/05/2014   LOW BACK PAIN, CHRONIC    Mixed hyperlipidemia    OSTEOPENIA    Stricture and stenosis of esophagus 05/14/2004    Patient Active Problem List   Diagnosis Date Noted   Insomnia 01/25/2021   Allergic rhinitis 01/28/2019   Dizziness 03/28/2018   Routine general medical examination at a health care facility 06/18/2016   Internal hemorrhoids 07/02/2013   Diet-controlled diabetes mellitus  ([place]) 03/09/2013   Frozen shoulder 03/03/2012   Chronic constipation 02/27/2011   GERD (gastroesophageal reflux disease) 02/27/2011   Hyperlipidemia associated with type 2 diabetes mellitus ([place]) 01/14/2009   ESOPHAGEAL STRICTURE 11/11/2008   Essential hypertension 12/01/2007   OSTEOPENIA 09/12/2007   Unspecified glaucoma 05/07/2007   Arthropathy 05/07/2007    Past Surgical History:  Procedure Laterality Date   BACK SURGERY     COLONOSCOPY  09/21/2013   [name]   ESOPHAGEAL MANOMETRY N/A 02/09/2013   Procedure: ESOPHAGEAL MANOMETRY (EM);  Surgeon: Sable Feil, MD;  Location: [facility];  Service: Endoscopy;  Laterality: N/A;   EYE SURGERY     frozen shoulder surgery Left    HEMORROIDECTOMY     HYSTEROSCOPY WITH D & C  07/20/2011   Procedure: DILATATION AND CURETTAGE /HYSTEROSCOPY;  Surgeon: Melina Schools, MD;  Location: [facility];  Service: Gynecology;  Laterality: N/A;   POLYPECTOMY  07/20/2011   Procedure: POLYPECTOMY;  Surgeon: Melina Schools, MD;  Location: [facility];  Service: Gynecology;  Laterality: N/A;   POLYPECTOMY     ROTATOR CUFF REPAIR     right    TUBAL LIGATION      OB History   No obstetric history on file.     Home Medications    Prior to Admission medications   Medication Sig Start Date End Date Taking? Authorizing Provider  amLODipine (NORVASC) 2.5 MG tablet Take 1 tablet (2.5 mg total) by mouth daily. 10/05/20   [name], MD  aspirin 81 MG tablet Take 81 mg by mouth daily.    [provider]  Calcium Carbonate-Vitamin D3 600-400 MG-UNIT TABS Take 2 tablets by mouth daily.    [provider]  DEXILANT 60 MG capsule TAKE 1 CAPSULE BY MOUTH IN THE MORNING AND AT BEDTIME 02/10/21   [name], MD  latanoprost (XALATAN) 0.005 % ophthalmic solution Place 1 drop into both eyes at bedtime. 11/25/18   [provider]  meloxicam (MOBIC) 7.5 MG tablet Take 1 tablet (7.5 mg total) by mouth daily. 10/04/20   [name], MD  phenylephrine-shark liver oil-mineral oil-petrolatum (PREPARATION H) 0.25-14-74.9 % rectal ointment Place 1 application rectally 2 (two) times daily as needed for hemorrhoids.    [provider]  Polyethylene Glycol 3350 (MIRALAX PO) Take 17 g by mouth daily.    [provider]  pravastatin (PRAVACHOL) 20 MG tablet Take 1 tablet (20 mg total) by mouth daily. 04/05/21   [name], MD  triamcinolone (KENALOG) 0.025 % ointment Apply topically as needed. 02/03/20   [provider]   Family History Family History  Problem Relation Age of Onset   Heart failure Father    Diabetes Mother    Breast cancer Sister    Colon cancer Sister 38   Diabetes Brother    Esophageal cancer Neg Hx    Rectal cancer Neg Hx    Stomach cancer Neg Hx    Colon polyps Neg Hx    Social History Social History   Tobacco Use   Smoking status: Never   Smokeless tobacco: Never  Vaping Use   Vaping Use: Never used  Substance Use Topics   Alcohol use: No    Alcohol/week: 0.0 standard drinks   Drug use: No     Allergies   Codeine and Hydrochlorothiazide   Review of Systems Review of Systems Per HPI  Physical Exam Triage Vital Signs ED Triage Vitals  Enc Vitals Group     BP 05/17/21 1341 127/76     Pulse Rate 05/17/21 1341 69     Resp 05/17/21 1341 14     Temp 05/17/21 1341 98.3 F (36.8 C)     Temp Source 05/17/21 1341 Oral     SpO2 05/17/21 1341 98 %     Weight --      Height --      Head Circumference --      Peak Flow --      Pain Score 05/17/21 1339 0     Pain Loc --      Pain Edu? --      Excl. in [place]? --    No data found.  Updated Vital Signs BP 127/76 (BP Location: Right Arm)    Pulse 69    Temp 98.3 F (36.8 C) (Oral)    Resp 14    SpO2 98%   Visual Acuity Right Eye Distance:   Left Eye Distance:   Bilateral Distance:    Right Eye Near:   Left Eye Near:    Bilateral Near:     Physical Exam Vitals and nursing note  reviewed.  Constitutional:      Appearance: Normal appearance. She is not ill-appearing.  HENT:     Head: Atraumatic.     Nose: Nose normal.  Eyes:     Extraocular Movements: Extraocular movements  intact.     Conjunctiva/sclera: Conjunctivae normal.  Cardiovascular:     Rate and Rhythm: Normal rate.     Comments: Occasional irregular beats, otherwise normal rhythm Pulmonary:     Effort: Pulmonary effort is normal.     Breath sounds: Normal breath sounds. No wheezing or rales.  Musculoskeletal:        General: Normal range of motion.     Cervical back: Normal range of motion and neck supple.  Skin:    General: Skin is warm and dry.  Neurological:     Mental Status: She is alert and oriented to person, place, and time.     Motor: No weakness.     Gait: Gait normal.  Psychiatric:        Mood and Affect: Mood normal.        Thought Content: Thought content normal.        Judgment: Judgment normal.     UC Treatments / Results  Labs (all labs ordered are listed, but only abnormal results are displayed) Labs Reviewed - No data to display  EKG   Radiology No results found.  Procedures Procedures (including critical care time)  Medications Ordered in UC Medications - No data to display  Initial Impression / Assessment and Plan / UC Course  I have reviewed the triage vital signs and the nursing notes.  Pertinent labs & imaging results that were available during my care of the patient were reviewed by me and considered in my medical decision making (see chart for details).     Vital signs benign and very reassuring today, exam overall reassuring and she appears in no acute distress.  Does have some occasional abnormal heartbeats on auscultation today.  EKG today showing sinus rhythm with premature supraventricular complexes, 77 bpm with some inferior T wave changes.  On chart review, previous EKGs from 1 to 3 years ago normal sinus rhythm with no acute ST or T wave  changes.  She is currently asymptomatic and feeling in her usual state of health.  Discussed that if she became symptomatic at any time she should go to the emergency department immediately for further evaluation and cardiac testing.  As she is currently asymptomatic and has been having mild intermittent symptoms for a month now, do suspect she is appropriate for outpatient cardiology evaluation in the next few days.  Information given to schedule this appointment.  She is agreeable to going to the emergency department anytime if her symptoms worsen.  Final Clinical Impressions(s) / UC Diagnoses   Final diagnoses:  Abnormal EKG  Palpitations     Discharge Instructions      If your symptoms worsen at any time please go to the emergency department for further evaluation.  Call the cardiologist and have an appointment as soon as possible.    ED Prescriptions   None    PDMP not reviewed this encounter.   Volney American, [place] 05/17/21 1414

## 2021-05-17 NOTE — Telephone Encounter (Signed)
Referral placed per [facility] urgent care visit.

## 2021-05-17 NOTE — Progress Notes (Signed)
Agree with assessment and plan as outlined.  

## 2021-05-18 NOTE — Telephone Encounter (Signed)
Noted  

## 2021-05-18 NOTE — Telephone Encounter (Signed)
Inbound call from patient, stated that she was able to get appointment for 1/9 at 8:40.

## 2021-05-19 ENCOUNTER — Ambulatory Visit (INDEPENDENT_AMBULATORY_CARE_PROVIDER_SITE_OTHER): Payer: Medicare Other

## 2021-05-19 ENCOUNTER — Encounter: Payer: Self-pay | Admitting: Cardiology

## 2021-05-19 ENCOUNTER — Ambulatory Visit: Payer: Medicare Other | Admitting: Cardiology

## 2021-05-19 ENCOUNTER — Other Ambulatory Visit (HOSPITAL_COMMUNITY)
Admission: RE | Admit: 2021-05-19 | Discharge: 2021-05-19 | Disposition: A | Discharge status: Home / Self Care | Payer: Medicare Other | Source: Ambulatory Visit | Attending: Cardiology | Admitting: Cardiology

## 2021-05-19 ENCOUNTER — Other Ambulatory Visit: Payer: Self-pay

## 2021-05-19 ENCOUNTER — Other Ambulatory Visit: Payer: Self-pay | Admitting: Cardiology

## 2021-05-19 VITALS — BP 118/74 | HR 72 | Ht 66.0 in | Wt 145.0 lb

## 2021-05-19 DIAGNOSIS — R002 Palpitations: Secondary | ICD-10-CM

## 2021-05-19 DIAGNOSIS — R9431 Abnormal electrocardiogram [ECG] [EKG]: Secondary | ICD-10-CM

## 2021-05-19 LAB — BASIC METABOLIC PANEL
Anion gap: 8 (ref 5–15)
BUN: 16 mg/dL (ref 8–23)
CO2: 27 mmol/L (ref 22–32)
Calcium: 9.5 mg/dL (ref 8.9–10.3)
Chloride: 105 mmol/L (ref 98–111)
Creatinine, Ser: 0.9 mg/dL (ref 0.44–1.00)
GFR, Estimated: >60 mL/min (ref >60)
Glucose, Bld: 103 mg/dL — ABNORMAL HIGH (ref 70–99)
Potassium: 4 mmol/L (ref 3.5–5.1)
Sodium: 140 mmol/L (ref 135–145)

## 2021-05-19 LAB — TSH: TSH: 1.942 u[IU]/mL (ref 0.350–4.500)

## 2021-05-19 LAB — MAGNESIUM: Magnesium: 2.2 mg/dL (ref 1.7–2.4)

## 2021-05-19 NOTE — Progress Notes (Signed)
Cardiology Office Note:    Date:  05/19/2021   ID:  Monica [name], DOB [birth year], MRN [id number]  PCP:  [name], MD  Cardiologist:  None  Electrophysiologist:  None   Referring MD: Carl Best *   Chief Complaint  Patient presents with   Palpitations    History of Present Illness:    Monica [name] is a 81 y.o. female with a hx of T2DM, IDA, GERD, hypertension, hyperlipidemia who is referred by Carl Best, NP for evaluation of palpitations.  She reports that she has been having palpitations, particularly occurring at night.  She denies any chest pain, dyspnea, lower extremity edema.  Does report some lightheadedness but denies any syncope.  No smoking history.  Family history includes father had CHF.   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   DEPRESSION, SITUATIONAL    GERD (gastroesophageal reflux disease)    w/ HH (EGD 2010)   GLAUCOMA    Hypertension    Iron deficiency anemia due to chronic blood loss 02/05/2014   LOW BACK PAIN, CHRONIC    Mixed hyperlipidemia    OSTEOPENIA    Stricture and stenosis of esophagus 05/14/2004    Past Surgical History:  Procedure Laterality Date   BACK SURGERY     COLONOSCOPY  09/21/2013   [name]   ESOPHAGEAL MANOMETRY N/A 02/09/2013   Procedure: ESOPHAGEAL MANOMETRY (EM);  Surgeon: Sable Feil, MD;  Location: [facility];  Service: Endoscopy;  Laterality: N/A;   EYE SURGERY     frozen shoulder surgery Left    HEMORROIDECTOMY     HYSTEROSCOPY WITH D & C  07/20/2011   Procedure: DILATATION AND CURETTAGE /HYSTEROSCOPY;  Surgeon: Melina Schools, MD;  Location: [facility];  Service: Gynecology;  Laterality: N/A;   POLYPECTOMY  07/20/2011   Procedure: POLYPECTOMY;  Surgeon: Melina Schools, MD;  Location: [facility];  Service: Gynecology;  Laterality: N/A;   POLYPECTOMY     ROTATOR CUFF REPAIR     right     TUBAL LIGATION      Current Medications: Current Meds  Medication Sig   amLODipine (NORVASC) 2.5 MG tablet Take 1 tablet (2.5 mg total) by mouth daily.   aspirin 81 MG tablet Take 81 mg by mouth daily.   DEXILANT 60 MG capsule TAKE 1 CAPSULE BY MOUTH IN THE MORNING AND AT BEDTIME   latanoprost (XALATAN) 0.005 % ophthalmic solution Place 1 drop into both eyes at bedtime.   meloxicam (MOBIC) 7.5 MG tablet Take 1 tablet (7.5 mg total) by mouth daily.   phenylephrine-shark liver oil-mineral oil-petrolatum (PREPARATION H) 0.25-14-74.9 % rectal ointment Place 1 application rectally 2 (two) times daily as needed for hemorrhoids.   Polyethylene Glycol 3350 (MIRALAX PO) Take 17 g by mouth daily.   pravastatin (PRAVACHOL) 20 MG tablet Take 1 tablet (20 mg total) by mouth daily.   triamcinolone (KENALOG) 0.025 % ointment Apply topically as needed.     Allergies:   Codeine and Hydrochlorothiazide   Social History   Socioeconomic History   Marital status: Divorced    Spouse name: Not on file   Number of children: 5   Years of education: Not on file   Highest education level: Not on file  Occupational History   Occupation: retired  Tobacco Use   Smoking status: Never   Smokeless tobacco: Never  Vaping Use   Vaping Use: Never used  Substance and Sexual Activity   Alcohol use: No    Alcohol/week: 0.0 standard drinks   Drug use: No   Sexual activity: Never  Other Topics Concern   Not on file  Social History Narrative   Not on file   Social Determinants of Health   Financial Resource Strain: Not on file  Food Insecurity: Not on file  Transportation Needs: Not on file  Physical Activity: Not on file  Stress: Not on file  Social Connections: Not on file     Family History: The patient's family history includes Breast cancer in her sister; Colon cancer (age of onset: 62) in her sister; Diabetes in her brother and mother; Heart failure in her father. There is no history of Esophageal  cancer, Rectal cancer, Stomach cancer, or Colon polyps.  ROS:   Please see the history of present illness.     All other systems reviewed and are negative.  EKGs/Labs/Other Studies Reviewed:    The following studies were reviewed today:   EKG:  EKG is not ordered today.  The ekg ordered 05/17/21 demonstrates sinus rhythm, rate 77, low voltage, inferior Q waves  Recent Labs: 01/25/2021: ALT 13; Hemoglobin 13.1; Platelets 223.0 05/19/2021: BUN 16; Creatinine, Ser 0.90; Magnesium 2.2; Potassium 4.0; Sodium 140; TSH 1.942  Recent Lipid Panel    Component Value Date/Time   CHOL 206 (H) 01/25/2021 0955   TRIG 77.0 01/25/2021 0955   HDL 59.50 01/25/2021 0955   CHOLHDL 3 01/25/2021 0955   VLDL 15.4 01/25/2021 0955   LDLCALC 131 (H) 01/25/2021 0955   LDLCALC 62 12/15/2019 1348    Physical Exam:    VS:  BP 118/74 (BP Location: Right Arm)    Pulse 72    Ht 5\' 6"  (1.676 m)    Wt 145 lb (65.8 kg)    SpO2 95%    BMI 23.40 kg/m     Wt Readings from Last 3 Encounters:  05/19/21 145 lb (65.8 kg)  05/17/21 145 lb (65.8 kg)  04/05/21 147 lb 9.6 oz (67 kg)     GEN:  Well nourished, well developed in no acute distress HEENT: Normal NECK: No JVD; No carotid bruits LYMPHATICS: No lymphadenopathy CARDIAC: RRR, no murmurs, rubs, gallops RESPIRATORY:  Clear to auscultation without rales, wheezing or rhonchi  ABDOMEN: Soft, non-tender, non-distended MUSCULOSKELETAL:  No edema; No deformity  SKIN: Warm and dry NEUROLOGIC:  Alert and oriented x 3 PSYCHIATRIC:  Normal affect   ASSESSMENT:    1. Palpitations   2. Nonspecific abnormal electrocardiogram (ECG) (EKG)    PLAN:     Palpitations: Description concerning for arrhythmia, evaluate with Zio patch x7 days.  Will check electrolytes, TSH  Abnormal EKG: Inferior Q waves.  Will check echocardiogram to evaluate for structural heart disease  RTC in 6 months   Medication Adjustments/Labs and Tests Ordered: Current medicines are reviewed  at length with the patient today.  Concerns regarding medicines are outlined above.  Orders Placed This Encounter  Procedures   Magnesium   Basic metabolic panel   TSH   ECHOCARDIOGRAM COMPLETE   No orders of the defined types were placed in this encounter.   Patient Instructions  Medication Instructions:  Your physician recommends that you continue on your current medications as directed. Please refer to the Current Medication list given to you today.  *If you need a refill on your cardiac medications before your next appointment, please call your pharmacy*  Lab Work: BMET MAG TSH If you have labs (blood work) drawn today and your tests are completely normal, you will receive your results only by: [employer] (if you have MyChart) OR A paper copy in the mail If you have any lab test that is abnormal or we need to change your treatment, we will call you to review the results.   Testing/Procedures: Your physician has requested that you have an echocardiogram. Echocardiography is a painless test that uses sound waves to create images of your heart. It provides your doctor with information about the size and shape of your heart and how well your hearts chambers and valves are working. This procedure takes approximately one hour. There are no restrictions for this procedure.    Follow-Up: At [facility], you and your health needs are our priority.  As part of our continuing mission to provide you with exceptional heart care, we have created designated Provider Care Teams.  These Care Teams include your primary Cardiologist (physician) and Advanced Practice Providers (APPs -  Physician Assistants and Nurse Practitioners) who all work together to provide you with the care you need, when you need it.  We recommend signing up for the patient portal called "MyChart".  Sign up information is provided on this After Visit Summary.  MyChart is used to connect with patients for Virtual  Visits (Telemedicine).  Patients are able to view lab/test results, encounter notes, upcoming appointments, etc.  Non-urgent messages can be sent to your provider as well.   To learn more about what you can do with MyChart, go to [email or web address].    Your next appointment:   6 month(s)  The format for your next appointment:   In Person  Provider:   Oswaldo Milian, MD     Other Instructions Bryn Gulling- Long Term Monitor Instructions   Your physician has requested you wear your ZIO patch monitor___7____days.   This is a single patch monitor.  [employer] supplies one patch monitor per enrollment.  Additional stickers are not available.   Please do not apply patch if you will be having a Nuclear Stress Test, Echocardiogram, Cardiac CT, MRI, or Chest Xray during the time frame you would be wearing the monitor. The patch cannot be worn during these tests.  You cannot remove and re-apply the ZIO XT patch monitor.   Your ZIO patch monitor will be sent USPS Priority mail from [facility] directly to your home address. The monitor may also be mailed to a PO BOX if home delivery is not available.   It may take 3-5 days to receive your monitor after you have been enrolled.   Once you have received you monitor, please review enclosed instructions.  Your monitor has already been registered assigning a specific monitor serial # to you.   Applying the monitor   Shave hair from upper left chest.   Hold abrader disc by orange tab.  Rub abrader in 40 strokes over left upper chest as indicated in your monitor instructions.   Clean area with 4 enclosed alcohol pads .  Use all pads to assure are is cleaned thoroughly.  Let dry.   Apply patch as indicated in monitor instructions.  Patch will be place under collarbone on left side of chest with arrow pointing upward.   Rub patch adhesive wings for 2 minutes.Remove white label marked "1".  Remove white label marked "2".  Rub patch adhesive  wings for 2 additional minutes.   While looking in a mirror,  press and release button in center of patch.  A small green light will flash 3-4 times .  This will be your only indicator the monitor has been turned on.     Do not shower for the first 24 hours.  You may shower after the first 24 hours.   Press button if you feel a symptom. You will hear a small click.  Record Date, Time and Symptom in the Patient Log Book.   When you are ready to remove patch, follow instructions on last 2 pages of Patient Log Book.  Stick patch monitor onto last page of Patient Log Book.   Place Patient Log Book in [place] box.  Use locking tab on box and tape box closed securely.  The [employer] and [employer] has [employer] on it.  Please place in mailbox as soon as possible.  Your physician should have your test results approximately 7 days after the monitor has been mailed back to [facility].   Call [place] at [phone] if you have questions regarding your ZIO XT patch monitor.  Call them immediately if you see an orange light blinking on your monitor.   If your monitor falls off in less than 4 days contact our Monitor department at [phone].  If your monitor becomes loose or falls off after 4 days call [employer] at [phone] for suggestions on securing your monitor.      Signed, Donato Heinz, MD  05/19/2021 9:43 PM    Micro

## 2021-05-19 NOTE — Patient Instructions (Signed)
Medication Instructions:  Your physician recommends that you continue on your current medications as directed. Please refer to the Current Medication list given to you today.  *If you need a refill on your cardiac medications before your next appointment, please call your pharmacy*   Lab Work: BMET MAG TSH If you have labs (blood work) drawn today and your tests are completely normal, you will receive your results only by: [place] (if you have MyChart) OR A paper copy in the mail If you have any lab test that is abnormal or we need to change your treatment, we will call you to review the results.   Testing/Procedures: Your physician has requested that you have an echocardiogram. Echocardiography is a painless test that uses sound waves to create images of your heart. It provides your doctor with information about the size and shape of your heart and how well your hearts chambers and valves are working. This procedure takes approximately one hour. There are no restrictions for this procedure.    Follow-Up: At [facility], you and your health needs are our priority.  As part of our continuing mission to provide you with exceptional heart care, we have created designated Provider Care Teams.  These Care Teams include your primary Cardiologist (physician) and Advanced Practice Providers (APPs -  Physician Assistants and Nurse Practitioners) who all work together to provide you with the care you need, when you need it.  We recommend signing up for the patient portal called "MyChart".  Sign up information is provided on this After Visit Summary.  MyChart is used to connect with patients for Virtual Visits (Telemedicine).  Patients are able to view lab/test results, encounter notes, upcoming appointments, etc.  Non-urgent messages can be sent to your provider as well.   To learn more about what you can do with MyChart, go to [email or web address].    Your next appointment:   6  month(s)  The format for your next appointment:   In Person  Provider:   Oswaldo Milian, MD     Other Instructions Bryn Gulling- Long Term Monitor Instructions   Your physician has requested you wear your ZIO patch monitor___7____days.   This is a single patch monitor.  [employer] supplies one patch monitor per enrollment.  Additional stickers are not available.   Please do not apply patch if you will be having a Nuclear Stress Test, Echocardiogram, Cardiac CT, MRI, or Chest Xray during the time frame you would be wearing the monitor. The patch cannot be worn during these tests.  You cannot remove and re-apply the ZIO XT patch monitor.   Your ZIO patch monitor will be sent USPS Priority mail from [facility] directly to your home address. The monitor may also be mailed to a PO BOX if home delivery is not available.   It may take 3-5 days to receive your monitor after you have been enrolled.   Once you have received you monitor, please review enclosed instructions.  Your monitor has already been registered assigning a specific monitor serial # to you.   Applying the monitor   Shave hair from upper left chest.   Hold abrader disc by orange tab.  Rub abrader in 40 strokes over left upper chest as indicated in your monitor instructions.   Clean area with 4 enclosed alcohol pads .  Use all pads to assure are is cleaned thoroughly.  Let dry.   Apply patch as indicated in monitor instructions.  Patch will be place under  collarbone on left side of chest with arrow pointing upward.   Rub patch adhesive wings for 2 minutes.Remove white label marked "1".  Remove white label marked "2".  Rub patch adhesive wings for 2 additional minutes.   While looking in a mirror, press and release button in center of patch.  A small green light will flash 3-4 times .  This will be your only indicator the monitor has been turned on.     Do not shower for the first 24 hours.  You may shower after the  first 24 hours.   Press button if you feel a symptom. You will hear a small click.  Record Date, Time and Symptom in the Patient Log Book.   When you are ready to remove patch, follow instructions on last 2 pages of Patient Log Book.  Stick patch monitor onto last page of Patient Log Book.   Place Patient Log Book in [place] box.  Use locking tab on box and tape box closed securely.  The [employer] and [employer] has [employer] on it.  Please place in mailbox as soon as possible.  Your physician should have your test results approximately 7 days after the monitor has been mailed back to [facility].   Call [place] at [phone] if you have questions regarding your ZIO XT patch monitor.  Call them immediately if you see an orange light blinking on your monitor.   If your monitor falls off in less than 4 days contact our Monitor department at [phone].  If your monitor becomes loose or falls off after 4 days call [employer] at [phone] for suggestions on securing your monitor.

## 2021-05-22 ENCOUNTER — Ambulatory Visit: Payer: Commercial Managed Care - HMO | Admitting: Internal Medicine

## 2021-05-23 ENCOUNTER — Telehealth: Payer: Self-pay | Admitting: Internal Medicine

## 2021-05-23 NOTE — Telephone Encounter (Signed)
Imane is returning Ann's call from yesterday in regards to her lab results

## 2021-05-23 NOTE — Telephone Encounter (Signed)
Spoke with pt and advised per Dr Harrington Challenger:  Thyroid function is normal Magnesium is normal Electrolytes and kidney function are OK  Pt verbalizes understanding and thanked RN for the call.

## 2021-05-26 ENCOUNTER — Telehealth: Payer: Self-pay | Admitting: Internal Medicine

## 2021-05-26 ENCOUNTER — Encounter (HOSPITAL_COMMUNITY): Payer: Self-pay | Admitting: Hematology and Oncology

## 2021-05-26 NOTE — Telephone Encounter (Signed)
Unsure who called the pt.. LM for her to call me back.. she had labs but noted she was given results on 05/23/21.

## 2021-05-26 NOTE — Telephone Encounter (Signed)
Patient states she was returning a call. Please advise  

## 2021-05-26 NOTE — Telephone Encounter (Signed)
Patient is returning call. She states she would like to inform us that she dropped off her monitor at the post office. She also mentioned when she put her monitor back in the box it was beeping. She would like to make sure this is normal. Please advise.

## 2021-05-26 NOTE — Telephone Encounter (Deleted)
LMTCB 11/3

## 2021-05-26 NOTE — Telephone Encounter (Signed)
I spoke with the pt and she has sent her monitor in and I advised her that I will call her with her results.

## 2021-05-31 DIAGNOSIS — R002 Palpitations: Secondary | ICD-10-CM | POA: Diagnosis not present

## 2021-06-14 ENCOUNTER — Encounter (HOSPITAL_COMMUNITY): Payer: Self-pay | Admitting: Hematology and Oncology

## 2021-06-21 ENCOUNTER — Ambulatory Visit (HOSPITAL_COMMUNITY): Admission: RE | Admit: 2021-06-21 | Payer: Commercial Managed Care - HMO | Source: Ambulatory Visit

## 2021-06-28 DIAGNOSIS — H401122 Primary open-angle glaucoma, left eye, moderate stage: Secondary | ICD-10-CM | POA: Diagnosis not present

## 2021-07-10 DIAGNOSIS — Z1231 Encounter for screening mammogram for malignant neoplasm of breast: Secondary | ICD-10-CM | POA: Diagnosis not present

## 2021-07-26 DIAGNOSIS — H401112 Primary open-angle glaucoma, right eye, moderate stage: Secondary | ICD-10-CM | POA: Diagnosis not present

## 2021-07-26 DIAGNOSIS — R311 Benign essential microscopic hematuria: Secondary | ICD-10-CM | POA: Diagnosis not present

## 2021-08-08 ENCOUNTER — Ambulatory Visit (INDEPENDENT_AMBULATORY_CARE_PROVIDER_SITE_OTHER): Payer: Medicare Other | Admitting: Internal Medicine

## 2021-08-08 ENCOUNTER — Encounter (HOSPITAL_COMMUNITY): Payer: Self-pay | Admitting: Hematology and Oncology

## 2021-08-08 ENCOUNTER — Other Ambulatory Visit: Payer: Self-pay

## 2021-08-08 ENCOUNTER — Encounter: Payer: Self-pay | Admitting: Internal Medicine

## 2021-08-08 VITALS — BP 130/80 | HR 75 | Resp 18 | Ht 66.0 in | Wt 143.8 lb

## 2021-08-08 DIAGNOSIS — E785 Hyperlipidemia, unspecified: Secondary | ICD-10-CM

## 2021-08-08 DIAGNOSIS — Z86010 Personal history of colonic polyps: Secondary | ICD-10-CM | POA: Diagnosis not present

## 2021-08-08 DIAGNOSIS — E119 Type 2 diabetes mellitus without complications: Secondary | ICD-10-CM | POA: Diagnosis not present

## 2021-08-08 DIAGNOSIS — H9193 Unspecified hearing loss, bilateral: Secondary | ICD-10-CM

## 2021-08-08 DIAGNOSIS — E1169 Type 2 diabetes mellitus with other specified complication: Secondary | ICD-10-CM | POA: Diagnosis not present

## 2021-08-08 LAB — LIPID PANEL
Cholesterol: 159 mg/dL (ref 0–200)
HDL: 62.6 mg/dL (ref >39.00)
LDL Cholesterol: 81 mg/dL (ref 0–99)
NonHDL: 95.93
Total CHOL/HDL Ratio: 3
Triglycerides: 73 mg/dL (ref 0.0–149.0)
VLDL: 14.6 mg/dL (ref 0.0–40.0)

## 2021-08-08 LAB — HEMOGLOBIN A1C: Hgb A1c MFr Bld: 6.9 % — ABNORMAL HIGH (ref 4.6–6.5)

## 2021-08-08 NOTE — Patient Instructions (Signed)
We will check the labs today and get you in with the audiologist and for the colonoscopy.  ?

## 2021-08-08 NOTE — Progress Notes (Signed)
? ?  Subjective:  ? ?Patient ID: Monica [name], female    DOB: 03/24/1941, 81 y.o.   MRN: [id number] ? ?HPI ?The patient is a 81 YO female coming in for follow up.  ? ?Review of Systems  ?Constitutional: Negative.   ?HENT: Negative.    ?Eyes: Negative.   ?Respiratory:  Negative for cough, chest tightness and shortness of breath.   ?Cardiovascular:  Negative for chest pain, palpitations and leg swelling.  ?Gastrointestinal:  Negative for abdominal distention, abdominal pain, constipation, diarrhea, nausea and vomiting.  ?Musculoskeletal: Negative.   ?Skin: Negative.   ?Neurological: Negative.   ?Psychiatric/Behavioral:  Positive for sleep disturbance.   ? ?Objective:  ?Physical Exam ?Constitutional:   ?   Appearance: She is well-developed.  ?HENT:  ?   Head: Normocephalic and atraumatic.  ?Cardiovascular:  ?   Rate and Rhythm: Normal rate and regular rhythm.  ?Pulmonary:  ?   Effort: Pulmonary effort is normal. No respiratory distress.  ?   Breath sounds: Normal breath sounds. No wheezing or rales.  ?Abdominal:  ?   General: Bowel sounds are normal. There is no distension.  ?   Palpations: Abdomen is soft.  ?   Tenderness: There is no abdominal tenderness. There is no rebound.  ?Musculoskeletal:  ?   Cervical back: Normal range of motion.  ?Skin: ?   General: Skin is warm and dry.  ?Neurological:  ?   Mental Status: She is alert and oriented to person, place, and time.  ?   Coordination: Coordination normal.  ? ? ?Vitals:  ? 08/08/21 0901  ?BP: 130/80  ?Pulse: 75  ?Resp: 18  ?SpO2: 98%  ?Weight: 143 lb 12.8 oz (65.2 kg)  ?Height: '5\' 6"'$  (1.676 m)  ? ? ?This visit occurred during the SARS-CoV-2 public health emergency.  Safety protocols were in place, including screening questions prior to the visit, additional usage of staff PPE, and extensive cleaning of exam room while observing appropriate contact time as indicated for disinfecting solutions.  ? ?Assessment & Plan:  ? ?

## 2021-08-11 ENCOUNTER — Encounter: Payer: Self-pay | Admitting: Internal Medicine

## 2021-08-11 DIAGNOSIS — H9193 Unspecified hearing loss, bilateral: Secondary | ICD-10-CM | POA: Insufficient documentation

## 2021-08-11 NOTE — Assessment & Plan Note (Signed)
Referral to audiology for hearing test and hearing aids if needed.  ?

## 2021-08-11 NOTE — Assessment & Plan Note (Signed)
Referral to GI for consideration of repeat colon cancer screening which is due. ?

## 2021-08-11 NOTE — Assessment & Plan Note (Signed)
Checking lipid panel and adjust pravastatin 20 mg daily as needed which she changed to in Nov 2023. Goal LDL <100.  ?

## 2021-08-11 NOTE — Assessment & Plan Note (Signed)
Checking HgA1c and lipid panel and adjust as needed. Diet controlled.  ?

## 2021-08-15 ENCOUNTER — Ambulatory Visit: Payer: Medicare Other | Attending: Internal Medicine | Admitting: Audiologist

## 2021-08-15 ENCOUNTER — Telehealth: Payer: Self-pay | Admitting: Internal Medicine

## 2021-08-15 DIAGNOSIS — H903 Sensorineural hearing loss, bilateral: Secondary | ICD-10-CM | POA: Insufficient documentation

## 2021-08-15 NOTE — Telephone Encounter (Signed)
I don't see reason for referral ? ?

## 2021-08-15 NOTE — Telephone Encounter (Signed)
Monica [name] would like a referral to an ENT.  Please advise. ?

## 2021-08-15 NOTE — Procedures (Signed)
?  [facility] ?[address] ?[place], [place]  [address] ?[phone] ? ?AUDIOLOGICAL  EVALUATION ? ?NAME: Monica [name]     ?DOB:   1941-05-12      ?MRN: [id number]                                                                                     ?DATE: 08/15/2021     ?REFERENT: [name], MD ?STATUS: Outpatient ?DIAGNOSIS: Mild Sensorineural Hearing Loss Bilateral   ? ?History: ?[name] was seen for an audiological evaluation. [name] was referred buy her primary provider, [name], MD. ?[name] is receiving a hearing evaluation due to concerns for difficulty hearing, mostly on the phone. [name] says her daughter feels she has hearing loss. This difficulty began gradually many years ago. No pain or pressure reported in either ear. Tinnitus denied for both ears. [name] feels her right ear hearing is worse for several years. She also cannot smell from left nostril. [name] has a history of noise exposure from working in [employer]. [name] says she was diligent about wearing her hearing protection at work. [name] has a history of dizziness of which her PCP is aware.  ?Medical history positive for diabetes which is a risk factor for hearing loss. Her diabetes is under control. No other relevant case history reported.  ? ?Evaluation:  ?Otoscopy showed a clear view of the tympanic membranes, bilaterally ?Tympanometry results were consistent with normal middle ear function, bilaterally   ?Audiometric testing was completed using conventional audiometry with insert transducer. Speech Recognition Thresholds were consistent with pure tone averages. Word Recognition was excellent at an elevated level of 40dB SL. Pure tone thresholds show normal sloping to mild sensorineural hearing loss in both ears. Test results are consistent with mild high frequency hearing loss.  ? ?Results:  ?The test results were reviewed with [name]. She has a mild hearing loss in each ear.  She is a borderline candidate for hearing aids. Recommend she trial hearing aids. Her [employer] will help cover the cost. [name] needs to start having her hearing tested annually. Also recommend communicating face to face within five feet. [name] will be able to fill in high frequency speech sounds that she misses by lipreading since they are all visible on the face. Hearing aids will help bring those high pitched consonants into an audible range.  ? ?Recommendations: ?Hearing loss is mild in each ear. [name] is a candidate for hearing aids if she feels her hearing loss impacts her daily.  ?Recommend annual hearing tests if [name] decides not to pursue hearing aids at this time. ?  ?[name]  ?Audiologist, Au.D., CCC-A ?08/15/2021  9:06 AM ? ?Cc: [name], MD  ?

## 2021-08-28 ENCOUNTER — Other Ambulatory Visit: Payer: Self-pay | Admitting: Internal Medicine

## 2021-08-28 DIAGNOSIS — H16223 Keratoconjunctivitis sicca, not specified as Sjogren's, bilateral: Secondary | ICD-10-CM | POA: Diagnosis not present

## 2021-09-05 ENCOUNTER — Telehealth: Payer: Self-pay

## 2021-09-05 NOTE — Telephone Encounter (Signed)
Pt was advised that insurance will not covered BID for DEXILANT 60 MG capsule.  ? ?PA is needed. ?

## 2021-09-08 NOTE — Telephone Encounter (Signed)
PA has been sent in. ? ? Key: [id number] ?

## 2021-09-08 NOTE — Telephone Encounter (Signed)
Unable to complete PA due to issues with patient's insurance. Attempted to call the pt to update her and the number on file kept ringing with no option to lvm. ?

## 2021-09-12 ENCOUNTER — Telehealth: Payer: Self-pay | Admitting: Internal Medicine

## 2021-10-02 ENCOUNTER — Encounter: Payer: Self-pay | Admitting: Gastroenterology

## 2021-10-03 ENCOUNTER — Ambulatory Visit (INDEPENDENT_AMBULATORY_CARE_PROVIDER_SITE_OTHER): Payer: Medicare Other

## 2021-10-03 DIAGNOSIS — I10 Essential (primary) hypertension: Secondary | ICD-10-CM

## 2021-10-03 DIAGNOSIS — E1169 Type 2 diabetes mellitus with other specified complication: Secondary | ICD-10-CM

## 2021-10-03 DIAGNOSIS — K219 Gastro-esophageal reflux disease without esophagitis: Secondary | ICD-10-CM

## 2021-10-03 DIAGNOSIS — H409 Unspecified glaucoma: Secondary | ICD-10-CM

## 2021-10-03 NOTE — Patient Instructions (Signed)
Visit Information  Following are the goals we discussed today:   Manage My Medicine   Timeframe:  Long-Range Goal Priority:  Medium Start Date:    10/03/2021                          Expected End Date:    10/04/2022                   Follow Up Date 03/2022   - call for medicine refill 2 or 3 days before it runs out - call if I am sick and can't take my medicine - keep a list of all the medicines I take; vitamins and herbals too - learn to read medicine labels    Why is this important?   These steps will help you keep on track with your medicines.  Plan: Telephone follow up appointment with care management team member scheduled for:  6 months The patient has been provided with contact information for the care management team and has been advised to call with any health related questions or concerns.   Tomasa Blase, PharmD Clinical Pharmacist, [facility]   Please call the care guide team at [phone] if you need to cancel or reschedule your appointment.   The patient verbalized understanding of instructions, educational materials, and care plan provided today and DECLINED offer to receive copy of patient instructions, educational materials, and care plan.

## 2021-10-03 NOTE — Progress Notes (Signed)
Chronic Care Management Pharmacy Note  10/03/2021 Name:  Monica [name] MRN:  [id number] DOB:  10/15/1940  Summary: -Patient reports recent changes to her eye drops - no longer taking latanoprost, is now taking lumigan once daily, systane up to TID prn, and cequa - which patient reports was listed on most recent AVS from ophthalmology but she does not have (unable to view ophthalmology notes to confirm)  -Pt reports that she has been without dexilant for about 1 month as insurance is requiring a prior auth for BID dosing - pt reports she was not using BID and thinks once a day dosing is enough to control GERD  Recommendations/Changes made from today's visit: -Advised for patient to reach out to ophthalmology office to confirm what eye drops she is to be taking  -will reach out to PCP about changing dexilant prescription to once daily dosing so she can continue therapy  -Advised for patient to start vitamin D- Calcium supplementation again   Subjective: Monica [name] is an 81 y.o. year old female who is a primary patient of Myrlene Broker, MD.  The CCM team was consulted for assistance with disease management and care coordination needs.    Engaged with patient by telephone for follow up visit in response to provider referral for pharmacy case management and/or care coordination services.   Consent to Services:  The patient was given the following information about Chronic Care Management services today, agreed to services, and gave verbal consent: 1. CCM service includes personalized support from designated clinical staff supervised by the primary care provider, including individualized plan of care and coordination with other care providers 2. 24/7 contact phone numbers for assistance for urgent and routine care needs. 3. Service will only be billed when office clinical staff spend 20 minutes or more in a month to coordinate care. 4. Only one practitioner may furnish and bill the  service in a calendar month. 5.The patient may stop CCM services at any time (effective at the end of the month) by phone call to the office staff. 6. The patient will be responsible for cost sharing (co-pay) of up to 20% of the service fee (after annual deductible is met). Patient agreed to services and consent obtained.  Patient Care Team: Myrlene Broker, MD as PCP - General (Internal Medicine) Tracey Harries, MD (Obstetrics and Gynecology) Antony Contras, MD (Ophthalmology) Mardella Layman, MD as Consulting Physician (Gastroenterology) Kathyrn Sheriff, [facility] as Pharmacist (Pharmacist) Erlene Quan Vinnie Level, [facility] (Pharmacist) Ellin Saba, [facility] as Pharmacist (Pharmacist)  Recent office visits: 08/08/2021 - Dr. Okey Dupre - referred to GI for consideratio of repeat colon cancer screening  no changes to medications - f/u in 6 months   Recent consult visits: 05/19/2021 - Dr. Bjorn Pippin  - Cardiology - palpitations  - zio patch x 7 days  no changes to medications - f/u in 6 months  05/17/2021 - Alcide Evener  NP - Laurette Schimke - irregular heart rhythm on exam - advised to contact PCP - advised proceed to urgent care  Hospital visits: 05/17/2021 - Urgent care - abnormal EKG - f/u with cardiology outpatient   Objective:  Lab Results  Component Value Date   CREATININE 0.90 05/19/2021   BUN 16 05/19/2021   GFR 56.66 (L) 01/25/2021   GFRNONAA >60 05/19/2021   GFRAA >60 10/31/2016   NA 140 05/19/2021   K 4.0 05/19/2021   CALCIUM 9.5 05/19/2021   CO2 27 05/19/2021   GLUCOSE 103 (H) 05/19/2021  Lab Results  Component Value Date/Time   HGBA1C 6.9 (H) 08/08/2021 10:02 AM   HGBA1C 6.9 (H) 01/25/2021 09:55 AM   GFR 56.66 (L) 01/25/2021 09:55 AM   GFR 75.41 06/12/2019 09:09 AM   MICROALBUR 4.4 (H) 01/25/2021 09:55 AM   MICROALBUR 1.2 12/15/2019 01:48 PM    Last diabetic Eye exam:  Lab Results  Component Value Date/Time   HMDIABEYEEXA No Retinopathy 01/23/2018 12:00 AM    Last  diabetic Foot exam:  No results found for: HMDIABFOOTEX   Lab Results  Component Value Date   CHOL 159 08/08/2021   HDL 62.60 08/08/2021   LDLCALC 81 08/08/2021   TRIG 73.0 08/08/2021   CHOLHDL 3 08/08/2021       Latest Ref Rng & Units 01/25/2021    9:55 AM 12/15/2019    1:48 PM 06/12/2019    9:09 AM  Hepatic Function  Total Protein 6.0 - 8.3 g/dL 7.4   7.6   7.5    Albumin 3.5 - 5.2 g/dL 4.2    4.4    AST 0 - 37 U/L $Remo'19   20   19    'VtdVh$ ALT 0 - 35 U/L $Remo'13   14   16    'gUWKD$ Alk Phosphatase 39 - 117 U/L 50    57    Total Bilirubin 0.2 - 1.2 mg/dL 0.9   0.8   0.8      Lab Results  Component Value Date/Time   TSH 1.942 05/19/2021 02:45 PM   TSH 1.70 06/12/2018 01:39 PM   TSH 2.33 11/07/2017 09:13 AM       Latest Ref Rng & Units 01/25/2021    9:55 AM 12/15/2019    1:48 PM 06/12/2019    9:09 AM  CBC  WBC 4.0 - 10.5 K/uL 4.6   5.6   3.3    Hemoglobin 12.0 - 15.0 g/dL 13.1   13.5   13.0    Hematocrit 36.0 - 46.0 % 40.1   42.5   39.9    Platelets 150.0 - 400.0 K/uL 223.0   242   224.0      Lab Results  Component Value Date/Time   VD25OH 35.89 06/12/2018 01:39 PM   VD25OH 43.00 11/07/2017 09:13 AM    Clinical ASCVD: No  The 10-year ASCVD risk score (Arnett DK, et al., 2019) is: 35.2%   Values used to calculate the score:     Age: 81 years     Sex: Female     Is Non-Hispanic African American: Yes     Diabetic: Yes     Tobacco smoker: No     Systolic Blood Pressure: 216 mmHg     Is BP treated: Yes     HDL Cholesterol: 62.6 mg/dL     Total Cholesterol: 159 mg/dL       06/10/2020    9:39 AM 09/19/2018    9:32 AM 08/12/2017    9:44 AM  Depression screen PHQ 2/9  Decreased Interest 0 0 0  Down, Depressed, Hopeless 0 0 1  PHQ - 2 Score 0 0 1  Altered sleeping  0 0  Tired, decreased energy  0 0  Change in appetite  0 0  Feeling bad or failure about yourself   0 0  Trouble concentrating  0 0  Moving slowly or fidgety/restless  0 0  Suicidal thoughts  0 0  PHQ-9 Score  0 1   Difficult doing work/chores  Not difficult at all Not difficult  at all      Social History   Tobacco Use  Smoking Status Never  Smokeless Tobacco Never   BP Readings from Last 3 Encounters:  08/08/21 130/80  05/19/21 118/74  05/17/21 127/76   Pulse Readings from Last 3 Encounters:  08/08/21 75  05/19/21 72  05/17/21 69   Wt Readings from Last 3 Encounters:  08/08/21 143 lb 12.8 oz (65.2 kg)  05/19/21 145 lb (65.8 kg)  05/17/21 145 lb (65.8 kg)   BMI Readings from Last 3 Encounters:  08/08/21 23.21 kg/m  05/19/21 23.40 kg/m  05/17/21 23.40 kg/m    Assessment/Interventions: Review of patient past medical history, allergies, medications, health status, including review of consultants reports, laboratory and other test data, was performed as part of comprehensive evaluation and provision of chronic care management services.   SDOH:  (Social Determinants of Health) assessments and interventions performed: Yes  SDOH Screenings   Alcohol Screen: Not on file  Depression (PHQ2-9): Not on file  Financial Resource Strain: Not on file  Food Insecurity: Not on file  Housing: Not on file  Physical Activity: Not on file  Social Connections: Not on file  Stress: Not on file  Tobacco Use: Low Risk    Smoking Tobacco Use: Never   Smokeless Tobacco Use: Never   Passive Exposure: Not on file  Transportation Needs: Not on file    [place]  Allergies  Allergen Reactions   Codeine     REACTION: Upset stomach   Hydrochlorothiazide     REACTION: Nausea and headache    Medications Reviewed Today     Reviewed by Tomasa Blase, [facility] (Pharmacist) on 10/03/21 at 1023  Med List Status: <None>   Medication Order Taking? Sig Documenting Provider Last Dose Status Informant  amLODipine (NORVASC) 2.5 MG tablet [id number] Yes Take 1 tablet (2.5 mg total) by mouth daily. [name], MD Taking Active   aspirin 81 MG tablet [id number] Yes Take 81 mg by mouth daily.  [provider] Taking Active Self  Calcium Carbonate-Vitamin D3 600-400 MG-UNIT TABS [id number] No Take 2 tablets by mouth daily.  Patient not taking: Reported on 05/19/2021   [provider] Not Taking Active   DEXILANT 60 MG capsule [id number] No TAKE 1 CAPSULE BY MOUTH IN THE MORNING AND AT BEDTIME  Patient not taking: Reported on 10/03/2021   [name], MD Not Taking Active   LUMIGAN 0.01 % SOLN [id number] Yes Place 1 drop into both eyes at bedtime. [provider] Taking Active   meloxicam (MOBIC) 7.5 MG tablet [id number] Yes Take 1 tablet (7.5 mg total) by mouth daily. [name], MD Taking Active   phenylephrine-shark liver oil-mineral oil-petrolatum (PREPARATION H) 0.25-14-74.9 % rectal ointment [id number] Yes Place 1 application rectally 2 (two) times daily as needed for hemorrhoids. [provider] Taking Active   Polyethyl Glycol-Propyl Glycol (SYSTANE OP) [id number] Yes Apply 1 drop to eye 3 (three) times daily as needed. [provider] Taking Active   Polyethylene Glycol 3350 (MIRALAX PO) [id number] Yes Take 17 g by mouth daily. [provider] Taking Active   pravastatin (PRAVACHOL) 20 MG tablet [id number] Yes Take 1 tablet (20 mg total) by mouth daily. [name], MD Taking Active   triamcinolone (KENALOG) 0.025 % ointment [id number] Yes Apply topically as needed. [provider] Taking Active             Patient Active Problem List   Diagnosis Date Noted  Bilateral hearing loss 08/11/2021   Insomnia 01/25/2021   Allergic rhinitis 01/28/2019   Dizziness 03/28/2018   Routine general medical examination at a health care facility 06/18/2016   Internal hemorrhoids 07/02/2013   Diet-controlled diabetes mellitus ([place]) 03/09/2013   Frozen shoulder 03/03/2012   Chronic constipation 02/27/2011   GERD (gastroesophageal reflux disease) 02/27/2011   Hyperlipidemia associated with type 2  diabetes mellitus ([place]) 01/14/2009   ESOPHAGEAL STRICTURE 11/11/2008   History of colon polyps 11/11/2008   Essential hypertension 12/01/2007   OSTEOPENIA 09/12/2007   Unspecified glaucoma 05/07/2007   Arthropathy 05/07/2007    Immunization History  Administered Date(s) Administered   PFIZER(Purple Top)SARS-COV-2 Vaccination 07/04/2019, 07/27/2019, 03/14/2020    Conditions to be addressed/monitored:  Hypertension, Hyperlipidemia, Diabetes, GERD and Open-angled Glaucoma   Care Plan : [place]  Updates made by Tomasa Blase, [place] since 10/03/2021 12:00 AM     Problem: HTN, DM2, Osteopenia, HLD, Glaucoma, GERD   Priority: High  Onset Date: 10/04/2020     Goal: Disease Management   Start Date: 10/04/2020  Expected End Date: 10/04/2022  This Visit's Progress: On track  Recent Progress: On track  Priority: High  Note:   Current Barriers:  Unable to independently monitor therapeutic efficacy Does not adhere to prescribed medication regimen  Pharmacist Clinical Goal(s):  Patient will achieve adherence to monitoring guidelines and medication adherence to achieve therapeutic efficacy maintain control of BP and A1c  as evidenced by continued controlled BP when checked at home and in office, and A1c remaining <7.0%  through collaboration with PharmD and provider.   Interventions: 1:1 collaboration with [name], MD regarding development and update of comprehensive plan of care as evidenced by provider attestation and co-signature Inter-disciplinary care team collaboration (see longitudinal plan of care) Comprehensive medication review performed; medication list updated in electronic medical record  Hypertension (BP goal <130/80) -Controlled -Current treatment: Amlodipine 2.5mg  - 1 tablet daily   -Medications previously tried: hctz  -Current home readings: n/a - does not check at home   BP Readings from Last 3 Encounters:  08/08/21 130/80  05/19/21 118/74   05/17/21 127/76  -Current dietary habits: reports that she uses salt in moderation, has little caffeine throughout the day  -Current exercise habits: n/a -Denies hypotensive/hypertensive symptoms -Educated on BP goals and benefits of medications for prevention of heart attack, stroke and kidney damage; Daily salt intake goal < 2300 mg; Symptoms of hypotension and importance of maintaining adequate hydration; -Counseled to monitor BP at home daily if able, document, and provide log at future appointments -Counseled on diet and exercise extensively Recommended to continue current medication  Hyperlipidemia: (LDL goal < 100 mg/dL) - Controlled Lab Results  Component Value Date   LDLCALC 81 08/08/2021  -Current treatment: Pravastatin 20mg  daily  ASA 81mg  daily   -Medications previously tried: n/a  -Current dietary patterns: reports to diet low in fried / fatty foods  -Educated on Cholesterol goals;  Benefits of statin for ASCVD risk reduction; Importance of limiting foods high in cholesterol; -Counseled on diet and exercise extensively Recommended for patient to continue current medications  Diabetes (A1c goal <7%) -Controlled Lab Results  Component Value Date   HGBA1C 6.9 (H) 08/08/2021  -Current medications: N/a - DM at this time is diet controlled  -Medications previously tried: n/a  -Current home glucose readings fasting glucose: unknown at this time - patient does not test BG -Current exercise: nothing scheduled  -Educated on A1c and blood sugar goals; Complications of  diabetes including kidney damage, retinal damage, and cardiovascular disease; -Counseled to check feet daily and get yearly eye exams -Counseled on diet and exercise extensively  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 Leticia Clas - non-[facility] provider - unable to view notes  -Current treatment  Lumigan - 1 drop in  each eye at bedtime  Systane -  1 drop in each eye up to TID prn  Cequa - 1 drop in each eye BID (pt does not currently have) - unable to view ophthalmology note as office is outside of [facility]  -Medications previously tried: Refresh eye drops, latanoprost  -Recommended to continue current medications, sounds as she is not taking Cequa - advised to reach out to ophthalmology office to confirm what she is to be taking   GERD (Goal: Acid control / prevention of flares ) -Controlled -Current treatment  Dexlansoprazole $RemoveBeforeDE'60mg'VLuUEBjFappTDLu$  BID - not currently taking - required PA for BID dosing - patient feels once daily dosing was sufficient for control  -Medications previously tried: n/a  -Recommended to continue current medication  - will reach out to PCP for updated directions on dexilant prescription   Osteoporosis / Osteopenia (Goal Promotion of bone health / prevention of disease ) -Not ideally controlled -Last DEXA Scan: 08/11/2013   T-Score femoral neck: -1.6  T-Score total hip: -1.6  T-Score lumbar spine: -0.7  T-Score forearm radius: n/a  10-year probability of major osteoporotic fracture: 5.7%  10-year probability of hip fracture: 1.4%   *per FRAX tool calculation -Patient is a candidate for pharmacologic treatment due to femoral neck t score of -1.6 - indicating osteopenia, would recommend supplementation with Ca/ Vit D -Current treatment  Calcium-Vitamin D - $R'600mg'eS$ -400units- 1 tablets daily  - not currently taking  -Medications previously tried: Calcium and VitD  -Counseled on diet and exercise extensively  -Recommended for patient to restart calcium and vitamin D supplementation   Health Maintenance -Current therapy:   Miralax - 17g daily prn  Meloxicam 7.$RemoveBefo'5mg'IcviLwjjvWK$  daily  Triamcinolone 0.025% ointment  -Educated on Cost vs benefit of each product must be carefully weighed by individual consumer -Patient is satisfied with current therapy and denies issues -Recommended to continue current  medication   Patient Goals/Self-Care Activities Patient will:  - take medications as prescribed  Follow Up Plan: telephone appointment with care management team member scheduled for:  6 months The patient has been provided with contact information for the care management team and has been advised to call with any health related questions or concerns.      Medication Assistance: None required.  Patient affirms current coverage meets needs.  Patient's preferred pharmacy is:  [facility] [place], [place] - [address] [place] [address] Phone: [phone] Fax: [phone]  Uses pill box? No Pt endorses 75% compliance   Care Plan and Follow Up Patient Decision:  Patient agrees to Care Plan and Follow-up.  Plan: Telephone follow up appointment with care management team member scheduled for:  6 months The patient has been provided with contact information for the care management team and has been advised to call with any health related questions or concerns.   Tomasa Blase, PharmD Clinical Pharmacist, [place]

## 2021-10-11 DIAGNOSIS — E785 Hyperlipidemia, unspecified: Secondary | ICD-10-CM | POA: Diagnosis not present

## 2021-10-11 DIAGNOSIS — H4010X0 Unspecified open-angle glaucoma, stage unspecified: Secondary | ICD-10-CM

## 2021-10-11 DIAGNOSIS — E1169 Type 2 diabetes mellitus with other specified complication: Secondary | ICD-10-CM

## 2021-10-11 DIAGNOSIS — M8580 Other specified disorders of bone density and structure, unspecified site: Secondary | ICD-10-CM

## 2021-10-11 DIAGNOSIS — I10 Essential (primary) hypertension: Secondary | ICD-10-CM | POA: Diagnosis not present

## 2021-10-31 ENCOUNTER — Encounter: Payer: Self-pay | Admitting: Gastroenterology

## 2021-10-31 ENCOUNTER — Ambulatory Visit: Payer: Medicare Other | Admitting: Gastroenterology

## 2021-10-31 VITALS — BP 138/82 | HR 84 | Ht 64.25 in | Wt 146.2 lb

## 2021-10-31 DIAGNOSIS — Z79899 Other long term (current) drug therapy: Secondary | ICD-10-CM | POA: Diagnosis not present

## 2021-10-31 DIAGNOSIS — K219 Gastro-esophageal reflux disease without esophagitis: Secondary | ICD-10-CM | POA: Diagnosis not present

## 2021-10-31 DIAGNOSIS — Z800 Family history of malignant neoplasm of digestive organs: Secondary | ICD-10-CM

## 2021-10-31 DIAGNOSIS — Z86010 Personal history of colonic polyps: Secondary | ICD-10-CM | POA: Diagnosis not present

## 2021-10-31 DIAGNOSIS — K5909 Other constipation: Secondary | ICD-10-CM | POA: Diagnosis not present

## 2021-10-31 DIAGNOSIS — K649 Unspecified hemorrhoids: Secondary | ICD-10-CM | POA: Diagnosis not present

## 2021-10-31 MED ORDER — NA SULFATE-K SULFATE-MG SULF 17.5-3.13-1.6 GM/177ML PO SOLN: 1.0000 | ORAL | 0 refills | Status: DC

## 2021-10-31 MED ORDER — DEXLANSOPRAZOLE 30 MG PO CPDR: 30.0000 mg | DELAYED_RELEASE_CAPSULE | Freq: Every day | ORAL | 3 refills | Status: DC

## 2021-10-31 NOTE — Patient Instructions (Addendum)
If you are age 79 or older, your body mass index should be between 23-30. Your Body mass index is 24.91 kg/m. If this is out of the aforementioned range listed, please consider follow up with your Primary Care Provider. ________________________________________________________  The [facility] providers would like to encourage you to use [facility] to communicate with providers for non-urgent requests or questions.  Due to long hold times on the telephone, sending your provider a message by [facility] may be a faster and more efficient way to get a response.  Please allow 48 business hours for a response.  Please remember that this is for non-urgent requests.  _______________________________________________________  Monica [name] have been scheduled for a colonoscopy. Please follow written instructions given to you at your visit today.  Please pick up your prep supplies at the pharmacy within the next 1-3 days. If you use inhalers (even only as needed), please bring them with you on the day of your procedure.  Due to recent changes in healthcare laws, you may see the results of your imaging and laboratory studies on MyChart before your provider has had a chance to review them.  We understand that in some cases there may be results that are confusing or concerning to you. Not all laboratory results come back in the same time frame and the provider may be waiting for multiple results in order to interpret others.  Please give [place] 48 hours in order for your provider to thoroughly review all the results before contacting the office for clarification of your results.   We have sent the following medications to your pharmacy for you to pick up at your convenience:  DECREASE: Dexilant to '30mg'$  one capsule daily  CONTINUE: Miralax daily.  Thank you for entrusting me with your care and choosing [facility].  Dr Havery Moros

## 2021-10-31 NOTE — Progress Notes (Signed)
HPI :  81 year old female here for a follow-up visit to discuss history of colon polyps, constipation, GERD.  She was last seen in January by Carl Best to discuss her constipation.  She states she has been taking generic MiraLAX once daily since that visit and her bowels have been doing much better.  She is having 1 bowel movement per day.  Constipation seems controlled, if she does not take the MiraLAX however she will have some symptoms that bother her.  She has been having some scant blood in the toilet paper she thinks from hemorrhoids, but not having any other irritation or symptoms from the hemorrhoids that bother her.  She is not having much of any straining anymore when she uses the bathroom.  Recall that her sister had colon cancer diagnosed at age 73.  The patient has had numerous polyps removed in the past including tubulovillous adenomas..  Her last colonoscopy with me was in June 2020, she had 4 polyps removed, most adenomas.  We discussed if she wanted any further colonoscopies.  She had an irregular heart rate at her last clinic visit which led to EKG, Holter monitor with cardiology.  She had mostly sinus rhythm, few short runs of SVT and PVCs but nothing that they felt warranted intervention.  She continues to be asymptomatic from this.  She wants to have another colonoscopy prior to stopping further exams.  Otherwise she has had reflux for a long time.  She has been on Dexilant 60 mg daily for a few years now.  She states this is well covered by her insurance and has worked well for her.  We have tried other regimens in the past, most recently omeprazole 40 mg daily to twice daily and she states did not work nearly as well.  She ran out of her Dexilant about a month ago, states she has had some recurrent reflux symptoms that are bothering her.  She had an EGD in 2014 which showed no evidence of Barrett's.  She has no history of CKD but does have a history of osteopenia.   Denies any significant bone fractures.  Prior work-up Colonoscopy 09/21/2013 - 69m transverse TA, hemorrhoids Esophageal manometry 02/09/2013 - normal  EGD 01/21/2013 - normal exam, empiric dilation performed Colonoscopy 05/18/2009 - 529mTVA   Colonoscopy 10/29/18 - The perianal and digital rectal examinations were normal. - A 3 mm polyp was found in the cecum. The polyp was sessile. The polyp was removed with a cold snare. Resection and retrieval were complete. - A 2 mm polyp was found in the transverse colon. The polyp was sessile. The polyp was removed with a cold snare. Resection and retrieval were complete. - A 2 mm polyp was found in the descending colon. The polyp was sessile. The polyp was removed with a cold snare. Resection and retrieval were complete. - A 4 to 5 mm polyp was found in the sigmoid colon. The polyp was sessile. The polyp was removed with a cold snare. Resection and retrieval were complete. - Internal hemorrhoids were found during retroflexion, with suspected stigmata of prior banding noted. - The exam was otherwise without abnormality.   Path shows TAs    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   DEPRESSION, SITUATIONAL    GERD (gastroesophageal reflux disease)    w/ HH (EGD 2010)   GLAUCOMA  Hypertension    Iron deficiency anemia due to chronic blood loss 02/05/2014   LOW BACK PAIN, CHRONIC    Mixed hyperlipidemia    OSTEOPENIA    Stricture and stenosis of esophagus 05/14/2004     Past Surgical History:  Procedure Laterality Date   BACK SURGERY     COLONOSCOPY  09/21/2013   [name]   ESOPHAGEAL MANOMETRY N/A 02/09/2013   Procedure: ESOPHAGEAL MANOMETRY (EM);  Surgeon: Sable Feil, MD;  Location: [facility];  Service: Endoscopy;  Laterality: N/A;   EYE SURGERY     frozen shoulder surgery Left    HEMORROIDECTOMY     HYSTEROSCOPY WITH D & C   07/20/2011   Procedure: DILATATION AND CURETTAGE /HYSTEROSCOPY;  Surgeon: Melina Schools, MD;  Location: [facility];  Service: Gynecology;  Laterality: N/A;   POLYPECTOMY  07/20/2011   Procedure: POLYPECTOMY;  Surgeon: Melina Schools, MD;  Location: [facility];  Service: Gynecology;  Laterality: N/A;   POLYPECTOMY     ROTATOR CUFF REPAIR     right    TUBAL LIGATION     Family History  Problem Relation Age of Onset   Diabetes Mother    Heart failure Father    Breast cancer Sister    Colon cancer Sister 56   Diabetes Brother    Esophageal cancer Neg Hx    Rectal cancer Neg Hx    Stomach cancer Neg Hx    Colon polyps Neg Hx    Social History   Tobacco Use   Smoking status: Never   Smokeless tobacco: Never  Vaping Use   Vaping Use: Never used  Substance Use Topics   Alcohol use: No    Alcohol/week: 0.0 standard drinks of alcohol   Drug use: No   Current Outpatient Medications  Medication Sig Dispense Refill   amLODipine (NORVASC) 2.5 MG tablet Take 1 tablet (2.5 mg total) by mouth daily. 90 tablet 3   aspirin 81 MG tablet Take 81 mg by mouth daily.     Calcium Carbonate-Vitamin D3 600-400 MG-UNIT TABS Take 2 tablets by mouth daily.     LUMIGAN 0.01 % SOLN Place 1 drop into both eyes at bedtime.     meloxicam (MOBIC) 7.5 MG tablet Take 1 tablet (7.5 mg total) by mouth daily. 90 tablet 3   phenylephrine-shark liver oil-mineral oil-petrolatum (PREPARATION H) 0.25-14-74.9 % rectal ointment Place 1 application rectally 2 (two) times daily as needed for hemorrhoids.     Polyethyl Glycol-Propyl Glycol (SYSTANE OP) Apply 1 drop to eye 3 (three) times daily as needed.     Polyethylene Glycol 3350 (MIRALAX PO) Take 17 g by mouth daily.     pravastatin (PRAVACHOL) 20 MG tablet Take 1 tablet (20 mg total) by mouth daily. 90 tablet 3   DEXILANT 60 MG capsule TAKE 1 CAPSULE BY MOUTH IN THE MORNING AND AT BEDTIME (Patient not taking: Reported on 10/03/2021) 180 capsule 1   No current  facility-administered medications for this visit.   Allergies  Allergen Reactions   Codeine     REACTION: Upset stomach   Hydrochlorothiazide     REACTION: Nausea and headache     Review of Systems: All systems reviewed and negative except where noted in HPI.   Lab Results  Component Value Date   WBC 4.6 01/25/2021   HGB 13.1 01/25/2021   HCT 40.1 01/25/2021   MCV 93.4 01/25/2021   PLT 223.0 01/25/2021    Lab Results  Component Value Date  CREATININE 0.90 05/19/2021   BUN 16 05/19/2021   NA 140 05/19/2021   K 4.0 05/19/2021   CL 105 05/19/2021   CO2 27 05/19/2021    Lab Results  Component Value Date   ALT 13 01/25/2021   AST 19 01/25/2021   ALKPHOS 50 01/25/2021   BILITOT 0.9 01/25/2021     Physical Exam: BP 138/82 (BP Location: Left Arm, Patient Position: Sitting, Cuff Size: Normal)   Pulse 84   Ht 5' 4.25" (1.632 m)   Wt 146 lb 4 oz (66.3 kg)   BMI 24.91 kg/m  Constitutional: Pleasant,well-developed, female in no acute distress. Neurological: Alert and oriented to person place and time. Psychiatric: Normal mood and affect. Behavior is normal.   ASSESSMENT AND PLAN: 81 year old female here for reassessment of the following:  GERD Long-term use of PPI Chronic constipation Hemorrhoids History of colon polyps Family history colon cancer  Discussed these issues as above.  Long-term use of chronic PPI, want to use the lowest dose of PPI needed or an alternative to control her symptoms.  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  She is agreeable to try 30 mg of Dexilant per day as opposed to 60 mg to see if we can control her symptoms with less medication.  She understands risks of long-term PPI and wants to continue at this time for symptomatic control.  She does have osteopenia and understands her risk for fracture is higher on this regimen.  Constipation much better controlled on  MiraLAX, she will continue this.  Hemorrhoids not bothersome to her too much right now.  We discussed her history of colon polyps and family history of colon cancer, and if she wants to have any more surveillance colonoscopies.  She is due for a colonoscopy now if she wants to have any more exams in general.  We discussed risk benefits of colonoscopy and anesthesia.  At her age, she wants to have 1 more exam prior to stopping further surveillance.  If his next exam shows no high risk lesions this would likely be her last period. She is agreeable to this and wants to proceed with colonoscopy, further recommendations pending the results.   Jolly Mango, MD [facility]

## 2021-11-08 ENCOUNTER — Other Ambulatory Visit: Payer: Self-pay | Admitting: Internal Medicine

## 2021-11-20 DIAGNOSIS — H2513 Age-related nuclear cataract, bilateral: Secondary | ICD-10-CM | POA: Diagnosis not present

## 2021-11-20 DIAGNOSIS — H401132 Primary open-angle glaucoma, bilateral, moderate stage: Secondary | ICD-10-CM | POA: Diagnosis not present

## 2021-11-27 ENCOUNTER — Encounter: Payer: Self-pay | Admitting: Gastroenterology

## 2021-11-30 ENCOUNTER — Other Ambulatory Visit: Payer: Self-pay | Admitting: Internal Medicine

## 2021-12-04 ENCOUNTER — Encounter: Payer: Self-pay | Admitting: Gastroenterology

## 2021-12-04 ENCOUNTER — Ambulatory Visit (AMBULATORY_SURGERY_CENTER): Payer: Medicare Other | Admitting: Gastroenterology

## 2021-12-04 VITALS — BP 137/75 | HR 67 | Temp 97.5°F | Resp 14 | Ht 64.0 in | Wt 146.0 lb

## 2021-12-04 DIAGNOSIS — Z800 Family history of malignant neoplasm of digestive organs: Secondary | ICD-10-CM | POA: Diagnosis not present

## 2021-12-04 DIAGNOSIS — Z09 Encounter for follow-up examination after completed treatment for conditions other than malignant neoplasm: Secondary | ICD-10-CM

## 2021-12-04 DIAGNOSIS — Z86010 Personal history of colonic polyps: Secondary | ICD-10-CM | POA: Diagnosis not present

## 2021-12-04 MED ORDER — SODIUM CHLORIDE 0.9 % IV SOLN: 500.0000 mL | Freq: Once | INTRAVENOUS | Status: DC

## 2021-12-04 NOTE — Patient Instructions (Addendum)
No further colonoscopies are needed due to the patient's age in light given no signfiicant polyps on this exam  YOU HAD AN ENDOSCOPIC PROCEDURE TODAY AT [facility]:   Refer to the procedure report that was given to you for any specific questions about what was found during the examination.  If the procedure report does not answer your questions, please call your gastroenterologist to clarify.  If you requested that your care partner not be given the details of your procedure findings, then the procedure report has been included in a sealed envelope for you to review at your convenience later.  YOU SHOULD EXPECT: Some feelings of bloating in the abdomen. Passage of more gas than usual.  Walking can help get rid of the air that was put into your GI tract during the procedure and reduce the bloating. If you had a lower endoscopy (such as a colonoscopy or flexible sigmoidoscopy) you may notice spotting of blood in your stool or on the toilet paper. If you underwent a bowel prep for your procedure, you may not have a normal bowel movement for a few days.  Please Note:  You might notice some irritation and congestion in your nose or some drainage.  This is from the oxygen used during your procedure.  There is no need for concern and it should clear up in a day or so.  SYMPTOMS TO REPORT IMMEDIATELY:  Following lower endoscopy (colonoscopy or flexible sigmoidoscopy):  Excessive amounts of blood in the stool  Significant tenderness or worsening of abdominal pains  Swelling of the abdomen that is new, acute  Fever of 100F or higher  For urgent or emergent issues, a gastroenterologist can be reached at any hour by calling [phone]. Do not use MyChart messaging for urgent concerns.    DIET:  We do recommend a small meal at first, but then you may proceed to your regular diet.  Drink plenty of fluids but you should avoid alcoholic beverages for 24 hours.  ACTIVITY:  You should plan  to take it easy for the rest of today and you should NOT DRIVE or use heavy machinery until tomorrow (because of the sedation medicines used during the test).    FOLLOW UP: Our staff will call the number listed on your records the next business day following your procedure.  We will call around 7:15- 8:00 am to check on you and address any questions or concerns that you may have regarding the information given to you following your procedure. If we do not reach you, we will leave a message.  If you develop any symptoms (ie: fever, flu-like symptoms, shortness of breath, cough etc.) before then, please call [phone].  If you test positive for Covid 19 in the 2 weeks post procedure, please call and report this information to [place].    If any biopsies were taken you will be contacted by phone or by letter within the next 1-3 weeks.  Please call us at [phone] if you have not heard about the biopsies in 3 weeks.    SIGNATURES/CONFIDENTIALITY: You and/or your care partner have signed paperwork which will be entered into your electronic medical record.  These signatures attest to the fact that that the information above on your After Visit Summary has been reviewed and is understood.  Full responsibility of the confidentiality of this discharge information lies with you and/or your care-partner.

## 2021-12-04 NOTE — Progress Notes (Signed)
[facility] History and Physical   Primary Care Physician:  [name], MD   Reason for Procedure:   History of colon polyps  Plan:    colonoscopy     HPI: Monica [name] is a 81 y.o. female  here for colonoscopy surveillance - history of polyps, last exam 10/2018. Sister with CRC dx age 35.[name] Patient denies any bowel symptoms at this time. Otherwise feels well without any cardiopulmonary symptoms.    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   DEPRESSION, SITUATIONAL    GERD (gastroesophageal reflux disease)    w/ HH (EGD 2010)   GLAUCOMA    Hypertension    Iron deficiency anemia due to chronic blood loss 02/05/2014   LOW BACK PAIN, CHRONIC    Mixed hyperlipidemia    OSTEOPENIA    Stricture and stenosis of esophagus 05/14/2004    Past Surgical History:  Procedure Laterality Date   BACK SURGERY     COLONOSCOPY  09/21/2013   [name]   ESOPHAGEAL MANOMETRY N/A 02/09/2013   Procedure: ESOPHAGEAL MANOMETRY (EM);  Surgeon: Sable Feil, MD;  Location: [facility];  Service: Endoscopy;  Laterality: N/A;   EYE SURGERY     frozen shoulder surgery Left    HEMORROIDECTOMY     HYSTEROSCOPY WITH D & C  07/20/2011   Procedure: DILATATION AND CURETTAGE /HYSTEROSCOPY;  Surgeon: Melina Schools, MD;  Location: [facility];  Service: Gynecology;  Laterality: N/A;   POLYPECTOMY  07/20/2011   Procedure: POLYPECTOMY;  Surgeon: Melina Schools, MD;  Location: [facility];  Service: Gynecology;  Laterality: N/A;   POLYPECTOMY     ROTATOR CUFF REPAIR     right    TUBAL LIGATION      Prior to Admission medications   Medication Sig Start Date End Date Taking? Authorizing Provider  amLODipine (NORVASC) 2.5 MG tablet Take 1 tablet by mouth once daily 11/08/21  Yes [name], MD  aspirin 81 MG tablet Take 81 mg by mouth daily.   Yes [provider]  Calcium  Carbonate-Vitamin D3 600-400 MG-UNIT TABS Take 2 tablets by mouth daily.   Yes [provider]  LUMIGAN 0.01 % SOLN Place 1 drop into both eyes at bedtime. 09/27/21  Yes [provider]  omeprazole (PRILOSEC) 40 MG capsule Take 1 capsule by mouth daily.   Yes [provider]  phenylephrine-shark liver oil-mineral oil-petrolatum (PREPARATION H) 0.25-14-74.9 % rectal ointment Place 1 application rectally 2 (two) times daily as needed for hemorrhoids.   Yes [provider]  Polyethyl Glycol-Propyl Glycol (SYSTANE OP) Apply 1 drop to eye 3 (three) times daily as needed.   Yes [provider]  Polyethylene Glycol 3350 (MIRALAX PO) Take 17 g by mouth daily.   Yes [provider]  pravastatin (PRAVACHOL) 20 MG tablet Take 1 tablet (20 mg total) by mouth daily. 04/05/21  Yes [name], MD  Dexlansoprazole (DEXILANT) 30 MG capsule DR Take 1 capsule (30 mg total) by mouth daily. 10/31/21   [name], [name], MD  meloxicam (MOBIC) 7.5 MG tablet Take 1 tablet by mouth once daily 11/30/21   [name], MD    Current Outpatient Medications  Medication Sig Dispense Refill   amLODipine (NORVASC) 2.5 MG tablet Take 1 tablet by mouth once daily 90 tablet 0   aspirin 81 MG tablet  Take 81 mg by mouth daily.     Calcium Carbonate-Vitamin D3 600-400 MG-UNIT TABS Take 2 tablets by mouth daily.     LUMIGAN 0.01 % SOLN Place 1 drop into both eyes at bedtime.     omeprazole (PRILOSEC) 40 MG capsule Take 1 capsule by mouth daily.     phenylephrine-shark liver oil-mineral oil-petrolatum (PREPARATION H) 0.25-14-74.9 % rectal ointment Place 1 application rectally 2 (two) times daily as needed for hemorrhoids.     Polyethyl Glycol-Propyl Glycol (SYSTANE OP) Apply 1 drop to eye 3 (three) times daily as needed.     Polyethylene Glycol 3350 (MIRALAX PO) Take 17 g by mouth daily.     pravastatin (PRAVACHOL) 20 MG tablet Take 1 tablet (20 mg total) by  mouth daily. 90 tablet 3   Dexlansoprazole (DEXILANT) 30 MG capsule DR Take 1 capsule (30 mg total) by mouth daily. 90 capsule 3   meloxicam (MOBIC) 7.5 MG tablet Take 1 tablet by mouth once daily 90 tablet 0   Current Facility-Administered Medications  Medication Dose Route Frequency Provider Last Rate Last Admin   0.9 %  sodium chloride infusion  500 mL Intravenous Once Jaequan Propes, [name], MD        Allergies as of 12/04/2021 - Review Complete 12/04/2021  Allergen Reaction Noted   Codeine  05/07/2007   Hydrochlorothiazide  12/29/2007    Family History  Problem Relation Age of Onset   Diabetes Mother    Heart failure Father    Breast cancer Sister    Colon cancer Sister 80   Diabetes Brother    Esophageal cancer Neg Hx    Rectal cancer Neg Hx    Stomach cancer Neg Hx    Colon polyps Neg Hx     Social History   Socioeconomic History   Marital status: Divorced    Spouse name: Not on file   Number of children: 5   Years of education: Not on file   Highest education level: Not on file  Occupational History   Occupation: retired  Tobacco Use   Smoking status: Never   Smokeless tobacco: Never  Vaping Use   Vaping Use: Never used  Substance and Sexual Activity   Alcohol use: No    Alcohol/week: 0.0 standard drinks of alcohol   Drug use: No   Sexual activity: Never  Other Topics Concern   Not on file  Social History Narrative   Not on file   Social Determinants of Health   Financial Resource Strain: Medium Risk (08/12/2017)   Overall Financial Resource Strain (CARDIA)    Difficulty of Paying Living Expenses: Somewhat hard  Food Insecurity: Food Insecurity Present (08/12/2017)   Hunger Vital Sign    Worried About Running Out of Food in the Last Year: Sometimes true    Ran Out of Food in the Last Year: Sometimes true  Transportation Needs: No Transportation Needs (08/12/2017)   PRAPARE - Hydrologist (Medical): No    Lack of  Transportation (Non-Medical): No  Physical Activity: Sufficiently Active (08/12/2017)   Exercise Vital Sign    Days of Exercise per Week: 4 days    Minutes of Exercise per Session: 40 min  Stress: No Stress Concern Present (08/12/2017)   [place]    Feeling of Stress : Not at all  Social Connections: Moderately Integrated (08/12/2017)   Social Connection and Isolation Panel [NHANES]    Frequency of Communication  with Friends and Family: More than three times a week    Frequency of Social Gatherings with Friends and Family: More than three times a week    Attends Religious Services: More than 4 times per year    Active Member of [employer] or Organizations: Yes    Attends Music therapist: More than 4 times per year    Marital Status: Divorced  Human resources officer Violence: Not on file    Review of Systems: All other review of systems negative except as mentioned in the HPI.  Physical Exam: Vital signs BP 140/62   Pulse 63   Temp (!) 97.5 F (36.4 C)   Ht '5\' 4"'$  (1.626 m)   Wt 146 lb (66.2 kg)   SpO2 100%   BMI 25.06 kg/m   General:   Alert,  Well-developed, pleasant and cooperative in NAD Lungs:  Clear throughout to auscultation.   Heart:  Regular rate and rhythm Abdomen:  Soft, nontender and nondistended.   Neuro/Psych:  Alert and cooperative. Normal mood and affect. A and O x 3  Jolly Mango, MD [facility]

## 2021-12-04 NOTE — Op Note (Signed)
[facility] Patient Name: Monica [name] Procedure Date: 12/04/2021 1:24 PM MRN: [id number] Endoscopist: Remo Lipps P. Havery Moros , MD Age: 81 Referring MD:  Date of Birth: Nov 25, 1940 Gender: Female Account #: [id number] Procedure:                Colonoscopy Indications:              High risk colon cancer surveillance: Personal                            history of colonic polyps - polyps 10/2018, sister                            with colon cancer dx age 27 Medicines:                Monitored Anesthesia Care Procedure:                Pre-Anesthesia Assessment:                           - Prior to the procedure, a History and Physical                            was performed, and patient medications and                            allergies were reviewed. The patient's tolerance of                            previous anesthesia was also reviewed. The risks                            and benefits of the procedure and the sedation                            options and risks were discussed with the patient.                            All questions were answered, and informed consent                            was obtained. Prior Anticoagulants: The patient has                            taken no previous anticoagulant or antiplatelet                            agents. ASA Grade Assessment: II - A patient with                            mild systemic disease. After reviewing the risks                            and benefits, the patient was deemed in  satisfactory condition to undergo the procedure.                           After obtaining informed consent, the colonoscope                            was passed under direct vision. Throughout the                            procedure, the patient's blood pressure, pulse, and                            oxygen saturations were monitored continuously. The                            Olympus [id number]-[id number])  Colonoscope was                            introduced through the anus and advanced to the the                            cecum, identified by appendiceal orifice and                            ileocecal valve. The colonoscopy was performed                            without difficulty. The patient tolerated the                            procedure well. The quality of the bowel                            preparation was good. The ileocecal valve,                            appendiceal orifice, and rectum were photographed. Scope In: 1:51:27 PM Scope Out: 2:07:34 PM Scope Withdrawal Time: 0 hours 12 minutes 9 seconds  Total Procedure Duration: 0 hours 16 minutes 7 seconds  Findings:                 The perianal and digital rectal examinations were                            normal.                           A few small-mouthed diverticula were found in the                            distal transverse colon and entire colon.                           The colon was tortuous.  Internal hemorrhoids were found during retroflexion.                           The exam was otherwise without abnormality. No                            polyps. Of note, first attempt using colonoscope                            was aborted due to equipment failure of the                            colonscope (one of the wheels failed to turn mid                            cecal intubation), this was removed and replaced                            with another pediatric colonoscope. Times reflect                            the time taken of the second colonscope. Complications:            No immediate complications. Estimated blood loss:                            None. Estimated Blood Loss:     Estimated blood loss: none. Impression:               - Diverticulosis in the distal transverse colon and                            in the entire examined colon.                           - Tortuous  colon.                           - Internal hemorrhoids.                           - The examination was otherwise normal.                           - No polyps Recommendation:           - Patient has a contact number available for                            emergencies. The signs and symptoms of potential                            delayed complications were discussed with the                            patient. Return to normal activities tomorrow.  Written discharge instructions were provided to the                            patient.                           - Resume previous diet.                           - Continue present medications.                           - No further colonoscopies are needed due to the                            patient's age in light given no signfiicant polyps                            on this exam Monica [name]. Monica Obarr, MD 12/04/2021 2:13:26 PM This report has been signed electronically.

## 2021-12-04 NOTE — Progress Notes (Signed)
VS by AS  Pt's states no medical or surgical changes since previsit or office visit.  

## 2021-12-04 NOTE — Progress Notes (Signed)
Pt non-responsive, VVS, Report to RN  °

## 2021-12-05 ENCOUNTER — Telehealth: Payer: Self-pay

## 2021-12-05 NOTE — Telephone Encounter (Signed)
  Follow up Call-     12/04/2021   12:37 PM  Call back number  Post procedure Call Back phone  # [phone]  Permission to leave phone message Yes     Patient questions:  Do you have a fever, pain , or abdominal swelling? No. Pain Score  0 *  Have you tolerated food without any problems? Yes.    Have you been able to return to your normal activities? Yes.    Do you have any questions about your discharge instructions: Diet   No. Medications  No. Follow up visit  No.  Do you have questions or concerns about your Care? No.  Actions: * If pain score is 4 or above: No action needed, pain <4.

## 2022-01-24 DIAGNOSIS — H35361 Drusen (degenerative) of macula, right eye: Secondary | ICD-10-CM | POA: Diagnosis not present

## 2022-02-08 ENCOUNTER — Ambulatory Visit (INDEPENDENT_AMBULATORY_CARE_PROVIDER_SITE_OTHER): Payer: Medicare Other | Admitting: Internal Medicine

## 2022-02-08 ENCOUNTER — Encounter: Payer: Self-pay | Admitting: Internal Medicine

## 2022-02-08 VITALS — BP 132/80 | HR 76 | Ht 64.25 in | Wt 150.0 lb

## 2022-02-08 DIAGNOSIS — E119 Type 2 diabetes mellitus without complications: Secondary | ICD-10-CM

## 2022-02-08 LAB — COMPREHENSIVE METABOLIC PANEL
ALT: 13 U/L (ref 0–35)
AST: 16 U/L (ref 0–37)
Albumin: 4.3 g/dL (ref 3.5–5.2)
Alkaline Phosphatase: 56 U/L (ref 39–117)
BUN: 13 mg/dL (ref 6–23)
CO2: 30 mEq/L (ref 19–32)
Calcium: 9.6 mg/dL (ref 8.4–10.5)
Chloride: 104 mEq/L (ref 96–112)
Creatinine, Ser: 0.93 mg/dL (ref 0.40–1.20)
GFR: 58.43 mL/min — ABNORMAL LOW (ref >60.00)
Glucose, Bld: 105 mg/dL — ABNORMAL HIGH (ref 70–99)
Potassium: 3.9 mEq/L (ref 3.5–5.1)
Sodium: 140 mEq/L (ref 135–145)
Total Bilirubin: 0.7 mg/dL (ref 0.2–1.2)
Total Protein: 7.5 g/dL (ref 6.0–8.3)

## 2022-02-08 LAB — HEMOGLOBIN A1C: Hgb A1c MFr Bld: 7.2 % — ABNORMAL HIGH (ref 4.6–6.5)

## 2022-02-08 LAB — MICROALBUMIN / CREATININE URINE RATIO
Creatinine,U: 191.6 mg/dL
Microalb Creat Ratio: 1.5 mg/g (ref 0.0–30.0)
Microalb, Ur: 2.9 mg/dL — ABNORMAL HIGH (ref 0.0–1.9)

## 2022-02-08 NOTE — Assessment & Plan Note (Signed)
Checking microalbumin to creatinine ratio and HgA1c and CMP. Adjust as needed. She is diet controlled and taking statin. Not on ACE-I/ARB.

## 2022-02-08 NOTE — Patient Instructions (Signed)
We will check the labs today. 

## 2022-02-08 NOTE — Progress Notes (Signed)
   Subjective:   Patient ID: Monica [name], female    DOB: December 02, 1940, 81 y.o.   MRN: [id number]  HPI The patient is a 81 YO female coming in for follow up.  Review of Systems  Constitutional: Negative.   HENT: Negative.    Eyes: Negative.   Respiratory:  Negative for cough, chest tightness and shortness of breath.   Cardiovascular:  Negative for chest pain, palpitations and leg swelling.  Gastrointestinal:  Negative for abdominal distention, abdominal pain, constipation, diarrhea, nausea and vomiting.       Worsening GERD  Musculoskeletal: Negative.   Skin: Negative.   Neurological: Negative.   Psychiatric/Behavioral: Negative.      Objective:  Physical Exam Constitutional:      Appearance: She is well-developed.  HENT:     Head: Normocephalic and atraumatic.  Cardiovascular:     Rate and Rhythm: Normal rate and regular rhythm.  Pulmonary:     Effort: Pulmonary effort is normal. No respiratory distress.     Breath sounds: Normal breath sounds. No wheezing or rales.  Abdominal:     General: Bowel sounds are normal. There is no distension.     Palpations: Abdomen is soft.     Tenderness: There is no abdominal tenderness. There is no rebound.  Musculoskeletal:     Cervical back: Normal range of motion.  Skin:    General: Skin is warm and dry.  Neurological:     Mental Status: She is alert and oriented to person, place, and time.     Coordination: Coordination normal.     Vitals:   02/08/22 0913  BP: 132/80  Pulse: 76  SpO2: 98%  Weight: 150 lb (68 kg)  Height: 5' 4.25" (1.632 m)    Assessment & Plan:

## 2022-02-12 ENCOUNTER — Other Ambulatory Visit: Payer: Self-pay | Admitting: Internal Medicine

## 2022-03-04 ENCOUNTER — Ambulatory Visit
Admission: EM | Admit: 2022-03-04 | Discharge: 2022-03-04 | Disposition: A | Discharge status: Home / Self Care | Payer: Medicare Other

## 2022-03-04 ENCOUNTER — Encounter: Payer: Self-pay | Admitting: Emergency Medicine

## 2022-03-04 DIAGNOSIS — S2002XA Contusion of left breast, initial encounter: Secondary | ICD-10-CM

## 2022-03-04 NOTE — ED Provider Notes (Signed)
[facility]    CSN: [id number] Arrival date & time: 03/04/22  8756      History   Chief Complaint Chief Complaint  Patient presents with   Breast Pain    HPI Monica [name] is a 81 y.o. female.   Patient presenting today with pain to the left breast above the nipple that occurred after the showerhead fell and hit her in the breast in this area 4 days ago.  She states the area is sore and that the nipple will sometimes go inward when she has her bra on since incident.  Denies any bruising or swelling, nipple discharge, masses palpable, difficulty breathing.  Took some Tylenol couple of days ago but has not taken anything since.    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   DEPRESSION, SITUATIONAL    GERD (gastroesophageal reflux disease)    w/ HH (EGD 2010)   GLAUCOMA    Hypertension    Iron deficiency anemia due to chronic blood loss 02/05/2014   LOW BACK PAIN, CHRONIC    Mixed hyperlipidemia    OSTEOPENIA    Stricture and stenosis of esophagus 05/14/2004    Patient Active Problem List   Diagnosis Date Noted   Bilateral hearing loss 08/11/2021   Insomnia 01/25/2021   Allergic rhinitis 01/28/2019   Dizziness 03/28/2018   Routine general medical examination at a health care facility 06/18/2016   Internal hemorrhoids 07/02/2013   Diet-controlled diabetes mellitus ([place]) 03/09/2013   Frozen shoulder 03/03/2012   Chronic constipation 02/27/2011   GERD (gastroesophageal reflux disease) 02/27/2011   Hyperlipidemia associated with type 2 diabetes mellitus ([place]) 01/14/2009   ESOPHAGEAL STRICTURE 11/11/2008   History of colon polyps 11/11/2008   Essential hypertension 12/01/2007   OSTEOPENIA 09/12/2007   Unspecified glaucoma 05/07/2007   Arthropathy 05/07/2007    Past Surgical History:  Procedure Laterality Date   BACK SURGERY     COLONOSCOPY  09/21/2013    [name]   ESOPHAGEAL MANOMETRY N/A 02/09/2013   Procedure: ESOPHAGEAL MANOMETRY (EM);  Surgeon: Sable Feil, MD;  Location: [facility];  Service: Endoscopy;  Laterality: N/A;   EYE SURGERY     frozen shoulder surgery Left    HEMORROIDECTOMY     HYSTEROSCOPY WITH D & C  07/20/2011   Procedure: DILATATION AND CURETTAGE /HYSTEROSCOPY;  Surgeon: Melina Schools, MD;  Location: [facility];  Service: Gynecology;  Laterality: N/A;   POLYPECTOMY  07/20/2011   Procedure: POLYPECTOMY;  Surgeon: Melina Schools, MD;  Location: [facility];  Service: Gynecology;  Laterality: N/A;   POLYPECTOMY     ROTATOR CUFF REPAIR     right    TUBAL LIGATION      OB History   No obstetric history on file.      Home Medications    Prior to Admission medications   Medication Sig Start Date End Date Taking? Authorizing Provider  amLODipine (NORVASC) 2.5 MG tablet Take 1 tablet by mouth once daily 02/13/22  Yes [name], MD  aspirin 81 MG tablet Take 81 mg by mouth daily.   Yes [provider]  Calcium Carbonate-Vitamin D3 600-400 MG-UNIT TABS Take 2 tablets by mouth daily.   Yes [provider]  Dexlansoprazole (DEXILANT) 30 MG capsule DR Take 1 capsule (30 mg total) by mouth daily. 10/31/21  Yes Armbruster, [name], MD  LUMIGAN 0.01 % SOLN Place 1 drop into both eyes at bedtime. 09/27/21  Yes [provider]  meloxicam (MOBIC) 7.5 MG tablet Take 1 tablet by mouth once daily 11/30/21  Yes [name], MD  omeprazole (PRILOSEC) 40 MG capsule Take 1 capsule by mouth daily.   Yes [provider]  phenylephrine-shark liver oil-mineral oil-petrolatum (PREPARATION H) 0.25-14-74.9 % rectal ointment Place 1 application rectally 2 (two) times daily as needed for hemorrhoids.   Yes [provider]  Polyethyl Glycol-Propyl Glycol (SYSTANE OP) Apply 1 drop to eye 3 (three) times daily as needed.   Yes [provider]  Polyethylene Glycol 3350 (MIRALAX  PO) Take 17 g by mouth daily.   Yes [provider]  pravastatin (PRAVACHOL) 20 MG tablet Take 1 tablet (20 mg total) by mouth daily. 04/05/21  Yes [name], MD    Family History Family History  Problem Relation Age of Onset   Diabetes Mother    Heart failure Father    Breast cancer Sister    Colon cancer Sister 63   Diabetes Brother    Esophageal cancer Neg Hx    Rectal cancer Neg Hx    Stomach cancer Neg Hx    Colon polyps Neg Hx     Social History Social History   Tobacco Use   Smoking status: Never   Smokeless tobacco: Never  Vaping Use   Vaping Use: Never used  Substance Use Topics   Alcohol use: No    Alcohol/week: 0.0 standard drinks of alcohol   Drug use: No     Allergies   Codeine and Hydrochlorothiazide   Review of Systems Review of Systems PER HPI  Physical Exam Triage Vital Signs ED Triage Vitals  Enc Vitals Group     BP 03/04/22 0933 136/79     Pulse Rate 03/04/22 0933 68     Resp 03/04/22 0933 18     Temp 03/04/22 0933 98.5 F (36.9 C)     Temp Source 03/04/22 0933 Oral     SpO2 03/04/22 0933 96 %     Weight 03/04/22 0935 148 lb (67.1 kg)     Height 03/04/22 0935 5' 4.5" (1.638 m)     Head Circumference --      Peak Flow --      Pain Score 03/04/22 0935 5     Pain Loc --      Pain Edu? --      Excl. in [place]? --    No data found.  Updated Vital Signs BP 136/79 (BP Location: Right Arm)   Pulse 68   Temp 98.5 F (36.9 C) (Oral)   Resp 18   Ht 5' 4.5" (1.638 m)   Wt 148 lb (67.1 kg)   SpO2 96%   BMI 25.01 kg/m   Visual Acuity Right Eye Distance:   Left Eye Distance:   Bilateral Distance:    Right Eye Near:   Left Eye Near:    Bilateral Near:     Physical Exam Vitals and nursing note reviewed.  Constitutional:      Appearance: Normal appearance. She is not ill-appearing.  HENT:     Head: Atraumatic.     Mouth/Throat:     Mouth: Mucous membranes are moist.  Eyes:     Extraocular Movements:  Extraocular movements intact.     Conjunctiva/sclera: Conjunctivae normal.  Cardiovascular:     Rate and Rhythm: Normal rate and regular rhythm.  Heart sounds: Normal heart sounds.  Pulmonary:     Effort: Pulmonary effort is normal.     Breath sounds: Normal breath sounds. No wheezing or rales.  Musculoskeletal:        General: Tenderness and signs of injury present. No swelling. Normal range of motion.     Cervical back: Normal range of motion and neck supple.     Comments: Minimal ttp left breast above nipple with no appreciable edema, bruising, masses palpable  Skin:    General: Skin is warm and dry.     Findings: No bruising or erythema.     Comments: No nipple discharge, no bruising or erythema left breast  Neurological:     Mental Status: She is alert and oriented to person, place, and time.     Motor: No weakness.     Gait: Gait normal.  Psychiatric:        Mood and Affect: Mood normal.        Thought Content: Thought content normal.        Judgment: Judgment normal.    UC Treatments / Results  Labs (all labs ordered are listed, but only abnormal results are displayed) Labs Reviewed - No data to display  EKG  Radiology No results found.  Procedures Procedures (including critical care time)  Medications Ordered in UC Medications - No data to display  Initial Impression / Assessment and Plan / UC Course  I have reviewed the triage vital signs and the nursing notes.  Pertinent labs & imaging results that were available during my care of the patient were reviewed by me and considered in my medical decision making (see chart for details).     Vitals and exam very reassuring, suspect some deep bruising from the injury.  Treat with warm compresses, lidocaine patches, Tylenol as needed.  Return for worsening symptoms.  Final Clinical Impressions(s) / UC Diagnoses   Final diagnoses:  Contusion of left breast, initial encounter     Discharge Instructions       Apply warm compresses to the area, you may use numbing patches such as lidocaine or Salonpas and Tylenol as needed.    ED Prescriptions   None    PDMP not reviewed this encounter.   Volney American, [place] 03/04/22 1008

## 2022-03-04 NOTE — Discharge Instructions (Signed)
Apply warm compresses to the area, you may use numbing patches such as lidocaine or Salonpas and Tylenol as needed.

## 2022-03-04 NOTE — ED Triage Notes (Signed)
Patient states that the shower head hit her left breast on Thursday.  The area is sore and the nipple will go in.  Patient has taken Tylenol for pain.

## 2022-03-06 ENCOUNTER — Encounter: Payer: Self-pay | Admitting: Internal Medicine

## 2022-03-06 ENCOUNTER — Ambulatory Visit: Payer: Medicare Other | Attending: Internal Medicine | Admitting: Internal Medicine

## 2022-03-06 VITALS — BP 124/82 | HR 66 | Ht 64.0 in | Wt 150.0 lb

## 2022-03-06 DIAGNOSIS — R002 Palpitations: Secondary | ICD-10-CM

## 2022-03-06 DIAGNOSIS — I10 Essential (primary) hypertension: Secondary | ICD-10-CM

## 2022-03-06 NOTE — Patient Instructions (Signed)
Medication Instructions:  Your physician recommends that you continue on your current medications as directed. Please refer to the Current Medication list given to you today.   Labwork: None  Testing/Procedures: None  Follow-Up: Follow up with Dr. Mallipeddi in 1 year.   Any Other Special Instructions Will Be Listed Below (If Applicable).     If you need a refill on your cardiac medications before your next appointment, please call your pharmacy.  

## 2022-03-06 NOTE — Progress Notes (Signed)
Cardiology Office Note  Date: 03/06/2022   ID: Monica [name], DOB August 14, 1940, MRN [id number]  PCP:  [name], MD  Cardiologist:  None Electrophysiologist:  None   Reason for Office Visit: Follow up of HTN and HLD   History of Present Illness: Monica [name] is a 81 y.o. female known to have HTN, HLD, DM2,ll presented to the cardiology clinic for follow-up visit. She was previously referred to the cardiology clinic for evaluation of palpitations.  Event monitor for 1 week showed no evidence of arrhythmias. Echo was ordered but was not performed.  Patient is here for follow-up visit. She denied any angina, DOE, palpitations, presyncope, syncope, LE swelling, claudication. She stated that she has a lot of stress, taking care of her son. Compliant with medications and no side effects.  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   DEPRESSION, SITUATIONAL    GERD (gastroesophageal reflux disease)    w/ HH (EGD 2010)   GLAUCOMA    Hypertension    Iron deficiency anemia due to chronic blood loss 02/05/2014   LOW BACK PAIN, CHRONIC    Mixed hyperlipidemia    OSTEOPENIA    Stricture and stenosis of esophagus 05/14/2004    Past Surgical History:  Procedure Laterality Date   BACK SURGERY     COLONOSCOPY  09/21/2013   [name]   ESOPHAGEAL MANOMETRY N/A 02/09/2013   Procedure: ESOPHAGEAL MANOMETRY (EM);  Surgeon: Sable Feil, MD;  Location: [facility];  Service: Endoscopy;  Laterality: N/A;   EYE SURGERY     frozen shoulder surgery Left    HEMORROIDECTOMY     HYSTEROSCOPY WITH D & C  07/20/2011   Procedure: DILATATION AND CURETTAGE /HYSTEROSCOPY;  Surgeon: Melina Schools, MD;  Location: [facility];  Service: Gynecology;  Laterality: N/A;   POLYPECTOMY  07/20/2011   Procedure: POLYPECTOMY;  Surgeon: Melina Schools, MD;  Location: [facility];  Service: Gynecology;  Laterality:  N/A;   POLYPECTOMY     ROTATOR CUFF REPAIR     right    TUBAL LIGATION      Current Outpatient Medications  Medication Sig Dispense Refill   amLODipine (NORVASC) 2.5 MG tablet Take 1 tablet by mouth once daily 90 tablet 0   aspirin 81 MG tablet Take 81 mg by mouth daily.     Calcium Carbonate-Vitamin D3 600-400 MG-UNIT TABS Take 2 tablets by mouth daily.     Dexlansoprazole (DEXILANT) 30 MG capsule DR Take 1 capsule (30 mg total) by mouth daily. 90 capsule 3   LUMIGAN 0.01 % SOLN Place 1 drop into both eyes at bedtime.     meloxicam (MOBIC) 7.5 MG tablet Take 1 tablet by mouth once daily 90 tablet 0   omeprazole (PRILOSEC) 40 MG capsule Take 1 capsule by mouth daily.     phenylephrine-shark liver oil-mineral oil-petrolatum (PREPARATION H) 0.25-14-74.9 % rectal ointment Place 1 application rectally 2 (two) times daily as needed for hemorrhoids.     Polyethyl Glycol-Propyl Glycol (SYSTANE OP) Apply 1 drop to eye 3 (three) times daily as needed.     Polyethylene Glycol 3350 (MIRALAX PO) Take 17 g by mouth daily.     pravastatin (PRAVACHOL) 20 MG tablet Take 1 tablet (20 mg total) by mouth daily. 90 tablet 3   No current facility-administered medications for this visit.   Allergies:  Codeine  and Hydrochlorothiazide   Social History: The patient  reports that she has never smoked. She has never used smokeless tobacco. She reports that she does not drink alcohol and does not use drugs.   Family History: The patient's family history includes Breast cancer in her sister; Colon cancer (age of onset: 12) in her sister; Diabetes in her brother and mother; Heart failure in her father.   ROS:  Please see the history of present illness. Otherwise, complete review of systems is positive for none.  All other systems are reviewed and negative.   Physical Exam: VS:  BP 124/82   Pulse 66   Ht '5\' 4"'$  (1.626 m)   Wt 150 lb (68 kg)   SpO2 97%   BMI 25.75 kg/m , BMI Body mass index is 25.75  kg/m.  Wt Readings from Last 3 Encounters:  03/06/22 150 lb (68 kg)  03/04/22 148 lb (67.1 kg)  02/08/22 150 lb (68 kg)    General: Patient appears comfortable at rest. HEENT: Conjunctiva and lids normal, oropharynx clear with moist mucosa. Neck: Supple, no elevated JVP or carotid bruits, no thyromegaly. Lungs: Clear to auscultation, nonlabored breathing at rest. Cardiac: Regular rate and rhythm, no S3 or significant systolic murmur, no pericardial rub. Abdomen: Soft, nontender, no hepatomegaly, bowel sounds present, no guarding or rebound. Extremities: No pitting edema, distal pulses 2+. Skin: Warm and dry. Musculoskeletal: No kyphosis. Neuropsychiatric: Alert and oriented x3, affect grossly appropriate.  ECG:  An ECG dated 03/06/22 was personally reviewed today and demonstrated:  NSR and no ST-T changes  Recent Labwork: 05/19/2021: Magnesium 2.2; TSH 1.942 02/08/2022: ALT 13; AST 16; BUN 13; Creatinine, Ser 0.93; Potassium 3.9; Sodium 140     Component Value Date/Time   CHOL 159 08/08/2021 1002   TRIG 73.0 08/08/2021 1002   HDL 62.60 08/08/2021 1002   CHOLHDL 3 08/08/2021 1002   VLDL 14.6 08/08/2021 1002   LDLCALC 81 08/08/2021 1002   LDLCALC 62 12/15/2019 1348    Other Studies Reviewed Today: Zio X 1 week 11 episodes of SVT, longest lasting 17 seconds with average rate 128 bpm Occasional PACs (1.3% of beats)  Assessment and Plan: Patient is a 81 year old F known to have HTN, HLD presented to cardiology clinic for follow-up visit of palpitations.  #Palpitations, resolved Plan -Event monitor from 05/2021 showed no evidence of arrhythmias.  Patient stated that she has been under a lot of stress, taking care of her son.  #HTN, controlled Plan -Continue amlodipine 2.5 mg once daily  #HLD with DM2, currently at goal Plan -Continue pravastatin 20 mg nightly. LDL goal less than 100.  No muscle cramps.  #Primary prevention of CAD Plan -No indication of aspirin but is  okay to continue aspirin if patient prefers.  I have spent a total of 34 minutes with patient reviewing chart, EKGs, labs and examining patient as well as establishing an assessment and plan that was discussed with the patient.  > 50% of time was spent in direct patient care.     Medication Adjustments/Labs and Tests Ordered: Current medicines are reviewed at length with the patient today.  Concerns regarding medicines are outlined above.   Tests Ordered: Orders Placed This Encounter  Procedures   EKG 12-Lead    Medication Changes: No orders of the defined types were placed in this encounter.   Disposition:  Follow up  one year  Signed, Falecia Vannatter Fidel Levy, MD, 03/06/2022 2:17 PM    [facility] at  [place] [address], [place], [place] [address]

## 2022-03-07 ENCOUNTER — Ambulatory Visit (HOSPITAL_COMMUNITY)
Admission: RE | Admit: 2022-03-07 | Discharge: 2022-03-07 | Disposition: A | Discharge status: Home / Self Care | Payer: Medicare Other | Source: Ambulatory Visit | Attending: Internal Medicine | Admitting: Internal Medicine

## 2022-03-07 DIAGNOSIS — I10 Essential (primary) hypertension: Secondary | ICD-10-CM | POA: Diagnosis not present

## 2022-03-07 DIAGNOSIS — R002 Palpitations: Secondary | ICD-10-CM | POA: Insufficient documentation

## 2022-03-07 DIAGNOSIS — E785 Hyperlipidemia, unspecified: Secondary | ICD-10-CM | POA: Insufficient documentation

## 2022-03-07 DIAGNOSIS — E119 Type 2 diabetes mellitus without complications: Secondary | ICD-10-CM | POA: Insufficient documentation

## 2022-03-07 DIAGNOSIS — I080 Rheumatic disorders of both mitral and aortic valves: Secondary | ICD-10-CM | POA: Insufficient documentation

## 2022-03-07 LAB — ECHOCARDIOGRAM COMPLETE
Area-P 1/2: 3.6 cm2
S' Lateral: 2.2 cm

## 2022-03-07 NOTE — Progress Notes (Signed)
*  PRELIMINARY RESULTS* Echocardiogram 2D Echocardiogram has been performed.  Monica [name] 03/07/2022, 2:40 PM

## 2022-03-08 ENCOUNTER — Telehealth: Payer: Self-pay

## 2022-03-08 NOTE — Telephone Encounter (Signed)
Patient notified and verbalized understanding. Patient had no questions or concerns at this time.  

## 2022-03-08 NOTE — Telephone Encounter (Signed)
-----   Message from Chalmers Guest, MD sent at 03/08/2022  8:11 AM EDT ----- Normal pumping function of the heart. Mild leakiness from the aortic valve which can be monitored with an echo every 3 years.

## 2022-03-12 DIAGNOSIS — H401132 Primary open-angle glaucoma, bilateral, moderate stage: Secondary | ICD-10-CM | POA: Diagnosis not present

## 2022-03-27 ENCOUNTER — Ambulatory Visit
Admission: EM | Admit: 2022-03-27 | Discharge: 2022-03-27 | Disposition: A | Discharge status: Home / Self Care | Payer: Medicare Other | Attending: Family Medicine | Admitting: Family Medicine

## 2022-03-27 ENCOUNTER — Encounter: Payer: Self-pay | Admitting: Emergency Medicine

## 2022-03-27 ENCOUNTER — Telehealth: Payer: Self-pay | Admitting: Internal Medicine

## 2022-03-27 ENCOUNTER — Other Ambulatory Visit: Payer: Self-pay

## 2022-03-27 DIAGNOSIS — N390 Urinary tract infection, site not specified: Secondary | ICD-10-CM | POA: Diagnosis not present

## 2022-03-27 LAB — POCT URINALYSIS DIP (MANUAL ENTRY)
Bilirubin, UA: NEGATIVE
Glucose, UA: NEGATIVE mg/dL
Ketones, POC UA: NEGATIVE mg/dL
Nitrite, UA: NEGATIVE
Protein Ur, POC: 100 mg/dL — AB
Spec Grav, UA: 1.025 (ref 1.010–1.025)
Urobilinogen, UA: 1 E.U./dL
pH, UA: 6 (ref 5.0–8.0)

## 2022-03-27 MED ORDER — CEPHALEXIN 500 MG PO CAPS: 500.0000 mg | ORAL_CAPSULE | Freq: Two times a day (BID) | ORAL | 0 refills | Status: DC

## 2022-03-27 NOTE — ED Triage Notes (Signed)
Pt reports RLQ quadrant pressure, dysuria, urinary frequency since yesterday. Denies any fever, pain.

## 2022-03-27 NOTE — ED Provider Notes (Signed)
[facility]    CSN: [id number] Arrival date & time: 03/27/22  0810      History   Chief Complaint Chief Complaint  Patient presents with   Abdominal Pain    HPI Monica [name] is a 81 y.o. female.   Presenting today with 1 day history of dysuria, urinary frequency, right lower quadrant pressure.  Denies fever, chills, flank pain, nausea, vomiting, bowel changes, hematuria.  So far not trying anything over-the-counter for symptoms.    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   DEPRESSION, SITUATIONAL    GERD (gastroesophageal reflux disease)    w/ HH (EGD 2010)   GLAUCOMA    Hypertension    Iron deficiency anemia due to chronic blood loss 02/05/2014   LOW BACK PAIN, CHRONIC    Mixed hyperlipidemia    OSTEOPENIA    Stricture and stenosis of esophagus 05/14/2004    Patient Active Problem List   Diagnosis Date Noted   Palpitations 03/06/2022   Bilateral hearing loss 08/11/2021   Insomnia 01/25/2021   Allergic rhinitis 01/28/2019   Dizziness 03/28/2018   Routine general medical examination at a health care facility 06/18/2016   Internal hemorrhoids 07/02/2013   Diet-controlled diabetes mellitus ([place]) 03/09/2013   Frozen shoulder 03/03/2012   Chronic constipation 02/27/2011   GERD (gastroesophageal reflux disease) 02/27/2011   Hyperlipidemia associated with type 2 diabetes mellitus ([place]) 01/14/2009   ESOPHAGEAL STRICTURE 11/11/2008   History of colon polyps 11/11/2008   Essential hypertension 12/01/2007   OSTEOPENIA 09/12/2007   Unspecified glaucoma 05/07/2007   Arthropathy 05/07/2007    Past Surgical History:  Procedure Laterality Date   BACK SURGERY     COLONOSCOPY  09/21/2013   [name]   ESOPHAGEAL MANOMETRY N/A 02/09/2013   Procedure: ESOPHAGEAL MANOMETRY (EM);  Surgeon: Sable Feil, MD;  Location: [facility];  Service: Endoscopy;   Laterality: N/A;   EYE SURGERY     frozen shoulder surgery Left    HEMORROIDECTOMY     HYSTEROSCOPY WITH D & C  07/20/2011   Procedure: DILATATION AND CURETTAGE /HYSTEROSCOPY;  Surgeon: Melina Schools, MD;  Location: [facility];  Service: Gynecology;  Laterality: N/A;   POLYPECTOMY  07/20/2011   Procedure: POLYPECTOMY;  Surgeon: Melina Schools, MD;  Location: [facility];  Service: Gynecology;  Laterality: N/A;   POLYPECTOMY     ROTATOR CUFF REPAIR     right    TUBAL LIGATION      OB History   No obstetric history on file.      Home Medications    Prior to Admission medications   Medication Sig Start Date End Date Taking? Authorizing Provider  cephALEXin (KEFLEX) 500 MG capsule Take 1 capsule (500 mg total) by mouth 2 (two) times daily. 03/27/22  Yes Volney American, PA-C  amLODipine (NORVASC) 2.5 MG tablet Take 1 tablet by mouth once daily 02/13/22   [name], MD  aspirin 81 MG tablet Take 81 mg by mouth daily.    [provider]  Calcium Carbonate-Vitamin D3 600-400 MG-UNIT TABS Take 2 tablets by mouth daily.    [provider]  Dexlansoprazole (DEXILANT) 30 MG capsule DR Take 1 capsule (30 mg total) by mouth daily. 10/31/21   Armbruster, [name], MD  LUMIGAN 0.01 % SOLN Place 1 drop into both eyes at bedtime. 09/27/21   [provider]  meloxicam (MOBIC) 7.5 MG tablet Take 1 tablet by mouth once daily 11/30/21   [name], MD  omeprazole (PRILOSEC) 40 MG capsule Take 1 capsule by mouth daily.    [provider]  phenylephrine-shark liver oil-mineral oil-petrolatum (PREPARATION H) 0.25-14-74.9 % rectal ointment Place 1 application rectally 2 (two) times daily as needed for hemorrhoids.    [provider]  Polyethyl Glycol-Propyl Glycol (SYSTANE OP) Apply 1 drop to eye 3 (three) times daily as needed.    [provider]  Polyethylene Glycol 3350 (MIRALAX PO) Take 17 g by mouth daily.    [provider]   pravastatin (PRAVACHOL) 20 MG tablet Take 1 tablet (20 mg total) by mouth daily. 04/05/21   [name], MD    Family History Family History  Problem Relation Age of Onset   Diabetes Mother    Heart failure Father    Breast cancer Sister    Colon cancer Sister 38   Diabetes Brother    Esophageal cancer Neg Hx    Rectal cancer Neg Hx    Stomach cancer Neg Hx    Colon polyps Neg Hx     Social History Social History   Tobacco Use   Smoking status: Never   Smokeless tobacco: Never  Vaping Use   Vaping Use: Never used  Substance Use Topics   Alcohol use: No    Alcohol/week: 0.0 standard drinks of alcohol   Drug use: No     Allergies   Codeine and Hydrochlorothiazide   Review of Systems Review of Systems Per HPI  Physical Exam Triage Vital Signs ED Triage Vitals  Enc Vitals Group     BP 03/27/22 0828 (!) 152/84     Pulse Rate 03/27/22 0828 73     Resp 03/27/22 0828 16     Temp 03/27/22 0828 97.9 F (36.6 C)     Temp Source 03/27/22 0828 Oral     SpO2 03/27/22 0828 98 %     Weight --      Height --      Head Circumference --      Peak Flow --      Pain Score 03/27/22 0826 7     Pain Loc --      Pain Edu? --      Excl. in [place]? --    No data found.  Updated Vital Signs BP (!) 152/84 (BP Location: Right Arm)   Pulse 73   Temp 97.9 F (36.6 C) (Oral)   Resp 16   SpO2 98%   Visual Acuity Right Eye Distance:   Left Eye Distance:   Bilateral Distance:    Right Eye Near:   Left Eye Near:    Bilateral Near:     Physical Exam Vitals and nursing note reviewed.  Constitutional:      Appearance: Normal appearance. She is not ill-appearing.  HENT:     Head: Atraumatic.  Eyes:     Extraocular Movements: Extraocular movements intact.     Conjunctiva/sclera: Conjunctivae normal.  Cardiovascular:     Rate and Rhythm: Normal rate and regular rhythm.     Heart sounds: Normal heart sounds.  Pulmonary:     Effort: Pulmonary effort is normal.      Breath sounds: Normal breath sounds.  Abdominal:     General: Bowel sounds are normal. There is no distension.     Palpations: Abdomen is soft.     Tenderness: There is  no abdominal tenderness. There is no right CVA tenderness, left CVA tenderness or guarding.  Musculoskeletal:        General: Normal range of motion.     Cervical back: Normal range of motion and neck supple.  Skin:    General: Skin is warm and dry.  Neurological:     Mental Status: She is alert and oriented to person, place, and time.  Psychiatric:        Mood and Affect: Mood normal.        Thought Content: Thought content normal.        Judgment: Judgment normal.      UC Treatments / Results  Labs (all labs ordered are listed, but only abnormal results are displayed) Labs Reviewed  POCT URINALYSIS DIP (MANUAL ENTRY) - Abnormal; Notable for the following components:      Result Value   Clarity, UA cloudy (*)    Blood, UA large (*)    Protein Ur, POC =100 (*)    Leukocytes, UA Large (3+) (*)    All other components within normal limits  URINE CULTURE    EKG   Radiology No results found.  Procedures Procedures (including critical care time)  Medications Ordered in UC Medications - No data to display  Initial Impression / Assessment and Plan / UC Course  I have reviewed the triage vital signs and the nursing notes.  Pertinent labs & imaging results that were available during my care of the patient were reviewed by me and considered in my medical decision making (see chart for details).     Urinalysis today with evidence of urinary tract infection.  Urine culture pending, treat with Keflex while awaiting results and adjust medication if needed.  Push fluids, return for worsening symptoms.  Final Clinical Impressions(s) / UC Diagnoses   Final diagnoses:  Acute lower UTI     Discharge Instructions      I have sent over an antibiotic to treat your urinary tract infection.  We have sent  your urine specimen out for a culture to get more detailed information about what might be going on with you.  Someone will call you if we need to change medication for any reason.  Make sure to drink lots of water and empty your bladder fully with each urination    ED Prescriptions     Medication Sig Dispense Auth. Provider   cephALEXin (KEFLEX) 500 MG capsule Take 1 capsule (500 mg total) by mouth 2 (two) times daily. 10 capsule Volney American, [place]      PDMP not reviewed this encounter.   Merrie Roof [place], [place] 03/27/22 [phone]

## 2022-03-27 NOTE — Discharge Instructions (Addendum)
I have sent over an antibiotic to treat your urinary tract infection.  We have sent your urine specimen out for a culture to get more detailed information about what might be going on with you.  Someone will call you if we need to change medication for any reason.  Make sure to drink lots of water and empty your bladder fully with each urination

## 2022-03-27 NOTE — Telephone Encounter (Signed)
Left message for patient to call back to schedule Medicare Annual Wellness Visit   Last AWV  04/05/21  Please schedule at anytime with [facility] if patient calls the office back.     Any questions, please call me at [phone]

## 2022-03-29 LAB — URINE CULTURE

## 2022-04-08 NOTE — Patient Instructions (Signed)

## 2022-04-08 NOTE — Progress Notes (Signed)
Subjective:   Monica [name] is a 81 y.o. female who presents for Medicare Annual (Subsequent) preventive examination. I connected with  Eulah Citizen on 04/09/22 by a audio enabled telemedicine application and verified that I am speaking with the correct person using two identifiers.  Patient Location: Home  Provider Location: Home Office  I discussed the limitations of evaluation and management by telemedicine. The patient expressed understanding and agreed to proceed.  Review of Systems    Deferred to PCP Cardiac Risk Factors include: advanced age (>54mn, >>42women);dyslipidemia;hypertension     Objective:    Today's Vitals   04/09/22 1324  PainSc: 3    There is no height or weight on file to calculate BMI.     04/09/2022    1:39 PM 09/19/2018    9:45 AM 08/12/2017    9:36 AM 10/31/2016    1:24 PM 04/30/2016    1:59 PM 04/28/2015   11:15 AM 10/28/2014   10:57 AM  Advanced Directives  Does Patient Have a Medical Advance Directive? No No No No No No No  Does patient want to make changes to medical advance directive? No - Patient declined        Would patient like information on creating a medical advance directive?  Yes (ED - Information included in AVS) Yes (ED - Information included in AVS) No - Patient declined No - Patient declined No - patient declined information No - patient declined information    Current Medications (verified) Outpatient Encounter Medications as of 04/09/2022  Medication Sig   amLODipine (NORVASC) 2.5 MG tablet Take 1 tablet by mouth once daily   aspirin 81 MG tablet Take 81 mg by mouth daily.   dexlansoprazole (DEXILANT) 60 MG capsule Take 1 capsule (60 mg total) by mouth daily.   LUMIGAN 0.01 % SOLN Place 1 drop into both eyes at bedtime.   meloxicam (MOBIC) 7.5 MG tablet Take 1 tablet by mouth once daily   phenylephrine-shark liver oil-mineral oil-petrolatum (PREPARATION H) 0.25-14-74.9 % rectal ointment Place 1 application rectally 2  (two) times daily as needed for hemorrhoids.   Polyethyl Glycol-Propyl Glycol (SYSTANE OP) Apply 1 drop to eye 3 (three) times daily as needed.   Polyethylene Glycol 3350 (MIRALAX PO) Take 17 g by mouth daily.   pravastatin (PRAVACHOL) 20 MG tablet Take 1 tablet (20 mg total) by mouth daily.   Calcium Carbonate-Vitamin D3 600-400 MG-UNIT TABS Take 2 tablets by mouth daily. (Patient not taking: Reported on 04/09/2022)   omeprazole (PRILOSEC) 40 MG capsule Take 1 capsule by mouth daily. (Patient not taking: Reported on 04/09/2022)   [DISCONTINUED] cephALEXin (KEFLEX) 500 MG capsule Take 1 capsule (500 mg total) by mouth 2 (two) times daily. (Patient not taking: Reported on 04/09/2022)   [DISCONTINUED] Dexlansoprazole (DEXILANT) 30 MG capsule DR Take 1 capsule (30 mg total) by mouth daily.   No facility-administered encounter medications on file as of 04/09/2022.    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   ESOPHAGEAL MANOMETRY N/A 02/09/2013   Procedure: ESOPHAGEAL MANOMETRY (EM);  Surgeon: Sable Feil, MD;  Location: [facility];  Service: Endoscopy;  Laterality: N/A;   EYE SURGERY     frozen shoulder surgery Left    HEMORROIDECTOMY     HYSTEROSCOPY WITH D & C  07/20/2011   Procedure: DILATATION AND CURETTAGE /HYSTEROSCOPY;  Surgeon: Melina Schools,  MD;  Location: [facility];  Service: Gynecology;  Laterality: N/A;   POLYPECTOMY  07/20/2011   Procedure: POLYPECTOMY;  Surgeon: Melina Schools, MD;  Location: [facility];  Service: Gynecology;  Laterality: N/A;   POLYPECTOMY     ROTATOR CUFF REPAIR     right    TUBAL LIGATION     Family History  Problem Relation Age of Onset   Diabetes Mother    Heart failure Father    Breast cancer Sister    Colon cancer Sister 44   Diabetes Brother    Esophageal cancer Neg Hx    Rectal cancer Neg Hx    Stomach cancer Neg Hx    Colon polyps Neg Hx    Social History   Socioeconomic History   Marital status: Divorced    Spouse name: Not on file   Number of children: 5   Years of education: 12th   Highest education level: Not on file  Occupational History   Occupation: retired  Tobacco Use   Smoking status: Never   Smokeless tobacco: Never  Vaping Use   Vaping Use: Never used  Substance and Sexual Activity   Alcohol use: No    Alcohol/week: 0.0 standard drinks of alcohol   Drug use: No   Sexual activity: Never  Other Topics Concern   Not on file  Social History Narrative   Not on file   Social Determinants of Health   Financial Resource Strain: Medium Risk (04/09/2022)   Overall Financial Resource Strain (CARDIA)    Difficulty of Paying Living Expenses: Somewhat hard  Food Insecurity: No Food Insecurity (04/09/2022)   Hunger Vital Sign    Worried About Running Out of Food in the Last Year: Never true    Ran Out of Food in the Last Year: Never true  Transportation Needs: No Transportation Needs (04/09/2022)   PRAPARE - Hydrologist (Medical): No    Lack of Transportation (Non-Medical): No  Physical Activity: Insufficiently Active (04/09/2022)   Exercise Vital Sign    Days of Exercise per Week: 3 days    Minutes of Exercise per Session: 20 min  Stress: No Stress Concern Present (04/09/2022)   [place]    Feeling of Stress : Only a little  Social Connections: Moderately Integrated (04/09/2022)   Social Connection and Isolation Panel [NHANES]    Frequency of Communication with Friends and Family: More than three times a week    Frequency of Social Gatherings with Friends and Family: More than three times a week    Attends Religious Services: More than 4 times per year    Active Member of [employer] or Organizations: Yes    Attends Music therapist: More than 4 times per year    Marital Status: Divorced    Tobacco  Counseling Counseling given: Not Answered   Clinical Intake:  Pre-visit preparation completed: Yes  Pain : 0-10 Pain Score: 3  Pain Type: Chronic pain Pain Location: Back Pain Descriptors / Indicators: Aching, Discomfort, Dull Pain Relieving Factors: medication  Pain Relieving Factors: medication  Nutritional Status: BMI 25 -29 Overweight Nutritional Risks: None Diabetes: Yes CBG done?: No (phone visit) Did pt. bring in CBG monitor from home?: No (phone visit)  How often do you need to have someone help you when you read instructions, pamphlets, or other written materials from your doctor or pharmacy?: 1 - Never  Diabetic?Yes Nutrition Risk Assessment:  Has the patient had any N/V/D within the last 2 months?  No  Does the patient have any non-healing wounds?  No  Has the patient had any unintentional weight loss or weight gain?  No   Diabetes:  Is the patient diabetic?  Yes  If diabetic, was a CBG obtained today?  No  Did the patient bring in their glucometer from home?  No  How often do you monitor your CBG's? Reports she does .   Financial Strains and Diabetes Management:  Are you having any financial strains with the device, your supplies or your medication? No .  Does the patient want to be seen by Chronic Care Management for management of their diabetes?  No  Would the patient like to be referred to a Nutritionist or for  Diabetic Management?  No   Diabetic Exams:  Diabetic Eye Exam: Completed 08/28/21 Diabetic Foot Exam: Completed 04/09/22   Interpreter Needed?: No  Information entered by :: Emelia Loron RN   Activities of Daily Living    04/09/2022    1:37 PM  In your present state of health, do you have any difficulty performing the following activities:  Hearing? 0  Vision? 0  Difficulty concentrating or making decisions? 0  Walking or climbing stairs? 0  Dressing or bathing? 0  Doing errands, shopping? 0  Preparing Food and eating ? N  Using the Toilet? N  In the past six months, have you accidently leaked urine? N  Do you have problems with loss of bowel control? N  Managing your Medications? N  Managing your Finances? N  Housekeeping or managing your Housekeeping? N    Patient Care Team: [name], MD as PCP - General (Internal Medicine) Newton Pigg, MD (Obstetrics and Gynecology) Katy Apo, MD (Ophthalmology) Sable Feil, MD as Consulting Physician (Gastroenterology) Charlton Haws, [facility] as Pharmacist (Pharmacist) Szabat, Darnelle Maffucci, [facility] (Inactive) (Pharmacist) Szabat, Darnelle Maffucci, [facility] (Inactive) as Pharmacist (Pharmacist)  Indicate any recent Medical Services you may have received from other than [facility] providers in the past year (date may be approximate).     Assessment:   This is a routine wellness examination for Sebastiana.  Hearing/Vision screen No results found.  Dietary issues and exercise activities discussed: Current Exercise Habits: Home exercise routine, Type of exercise: walking, Time (Minutes): 20, Frequency (Times/Week): 3, Weekly Exercise (Minutes/Week): 60, Intensity: Mild, Exercise limited by: orthopedic condition(s)   Goals Addressed               This Visit's Progress     Patient Stated     patient stated (pt-stated)        Maintain current health status.      Depression Screen    04/09/2022    1:30 PM 04/09/2022    10:05 AM 06/10/2020    9:39 AM 09/19/2018    9:32  AM 08/12/2017    9:44 AM 04/19/2016    9:31 AM 04/11/2015    8:38 AM  PHQ 2/9 Scores  PHQ - 2 Score 0 0 0 0 1 0 0  PHQ- 9 Score  0  0 1      Fall Risk    04/09/2022    1:38 PM 04/09/2022   10:05 AM 10/04/2020    1:00 PM 08/31/2019    1:13 PM 09/19/2018    9:32 AM  Sentinel in the past year? 0 0 0 0 0  Number falls in past yr: 0 0 0 0 0  Injury with Fall? 0 0 0 0 1  Risk for fall due to : No Fall Risks      Follow up Falls evaluation completed Falls evaluation completed   Falls prevention discussed    FALL RISK PREVENTION PERTAINING TO THE HOME:  Any stairs in or around the home? Yes  If so, are there any without handrails? Yes  Home free of loose throw rugs in walkways, pet beds, electrical cords, etc? Yes  Adequate lighting in your home to reduce risk of falls? Yes   ASSISTIVE DEVICES UTILIZED TO PREVENT FALLS:  Life alert? No  Use of a cane, walker or w/c? No  Grab bars in the bathroom? No  Shower chair or bench in shower? No  Elevated toilet seat or a handicapped toilet? No   Cognitive Function:    08/12/2017    9:44 AM  MMSE - Mini Mental State Exam  Orientation to time 5  Orientation to Place 5  Registration 3  Attention/ Calculation 4  Recall 2  Language- name 2 objects 2  Language- repeat 1  Language- follow 3 step command 3  Language- read & follow direction 1  Write a sentence 1  Copy design 1  Total score 28        04/09/2022    1:44 PM  6CIT Screen  What Year? 0 points  What month? 0 points  What time? 0 points  Count back from 20 0 points  Months in reverse 2 points  Repeat phrase 2 points  Total Score 4 points    Immunizations Immunization History  Administered Date(s) Administered   PFIZER(Purple Top)SARS-COV-2 Vaccination 07/04/2019, 07/27/2019, 03/14/2020    Flu Vaccine status: Due, Education has been provided regarding the importance of this vaccine. Advised may receive this  vaccine at local pharmacy or Health Dept. Aware to provide a copy of the vaccination record if obtained from local pharmacy or Health Dept. Verbalized acceptance and understanding.  Pneumococcal vaccine status: Due, Education has been provided regarding the importance of this vaccine. Advised may receive this vaccine at local pharmacy or Health Dept. Aware to provide a copy of the vaccination record if obtained from local pharmacy or Health Dept. Verbalized acceptance and understanding.  Covid-19 vaccine status: Information provided on how to obtain vaccines.   Qualifies for Shingles Vaccine? Yes   Zostavax completed No   Shingrix Completed?: No.    Education has been provided regarding the importance of this vaccine. Patient has been advised to call insurance company to determine out of pocket expense if they have not yet received this vaccine. Advised may also receive vaccine at local pharmacy or Health Dept. Verbalized acceptance and understanding.  Screening Tests Health Maintenance  Topic Date Due   Hepatitis C Screening  Never done   Zoster Vaccines- Shingrix (1 of 2) Never done   Pneumonia  Vaccine 53+ Years old (1 - PCV) Never done   OPHTHALMOLOGY EXAM  04/09/2022 (Originally 11/11/2021)   COVID-19 Vaccine (4 - 2023-24 season) 04/25/2022 (Originally 01/12/2022)   INFLUENZA VACCINE  08/12/2022 (Originally 12/12/2021)   HEMOGLOBIN A1C  08/09/2022   Diabetic kidney evaluation - GFR measurement  02/09/2023   Diabetic kidney evaluation - Urine ACR  02/09/2023   FOOT EXAM  04/10/2023   Medicare Annual Wellness (AWV)  04/10/2023   COLONOSCOPY (Pts 45-66yr Insurance coverage will need to be confirmed)  12/04/2024   DEXA SCAN  Completed   HPV VACCINES  Aged Out    Health Maintenance  Health Maintenance Due  Topic Date Due   Hepatitis C Screening  Never done   Zoster Vaccines- Shingrix (1 of 2) Never done   Pneumonia Vaccine 79 Years old (1 - PCV) Never done    Colorectal cancer  screening: Type of screening: Colonoscopy. Completed 12/04/21. Repeat every 3 years  Mammogram status: Completed 07/07/18. Repeat every year  Bone Density status: Completed 08/10/13. Results reflect: Bone density results: OSTEOPENIA. Repeat every once years.  Lung Cancer Screening: (Low Dose CT Chest recommended if Age 79-80years, 30 pack-year currently smoking OR have quit w/in 15years.) does not qualify.   Additional Screening:  Hepatitis C Screening: does qualify; Completed education provided  Vision Screening: Recommended annual ophthalmology exams for early detection of glaucoma and other disorders of the eye. Is the patient up to date with their annual eye exam?  Yes  Who is the provider or what is the name of the office in which the patient attends annual eye exams? [facility] pt is not established with a provider, would they like to be referred to a provider to establish care?  N/A .   Dental Screening: Recommended annual dental exams for proper oral hygiene  Community Resource Referral / Chronic Care Management: CRR required this visit?  Yes , financial strain assistance  CCM required this visit?  No      Plan:     I have personally reviewed and noted the following in the patient's chart:   Medical and social history Use of alcohol, tobacco or illicit drugs  Current medications and supplements including opioid prescriptions. Patient is not currently taking opioid prescriptions. Functional ability and status Nutritional status Physical activity Advanced directives List of other physicians Hospitalizations, surgeries, and ER visits in previous 12 months Vitals Screenings to include cognitive, depression, and falls Referrals and appointments  In addition, I have reviewed and discussed with patient certain preventive protocols, quality metrics, and best practice recommendations. A written personalized care plan for preventive services as well as general  preventive health recommendations were provided to patient.     JMichiel Cowboy RN   04/09/2022   Nurse Notes:  Ms. LKosanke, Thank you for taking time to come for your Medicare Wellness Visit. I appreciate your ongoing commitment to your health goals. Please review the following plan we discussed and let me know if I can assist you in the future.   These are the goals we discussed:  Goals       Patient Stated     patient stated (pt-stated)      Maintain current health status.      Other     Manage My Medicine      Timeframe:  Long-Range Goal Priority:  Medium Start Date:    10/03/2021  Expected End Date:    10/04/2022                   Follow Up Date 03/2022   - call for medicine refill 2 or 3 days before it runs out - call if I am sick and can't take my medicine - keep a list of all the medicines I take; vitamins and herbals too - learn to read medicine labels    Why is this important?   These steps will help you keep on track with your medicines.   Notes:       Patient Stated      Continue to reach out others and help all that I can. Continue to eat healthy, exercise, enjoy life and worship God.        This is a list of the screening recommended for you and due dates:  Health Maintenance  Topic Date Due   Hepatitis C Screening: USPSTF Recommendation to screen - Ages 37-79 yo.  Never done   Zoster (Shingles) Vaccine (1 of 2) Never done   Pneumonia Vaccine (1 - PCV) Never done   Eye exam for diabetics  04/09/2022*   COVID-19 Vaccine (4 - 2023-24 season) 04/25/2022*   Flu Shot  08/12/2022*   Hemoglobin A1C  08/09/2022   Yearly kidney function blood test for diabetes  02/09/2023   Yearly kidney health urinalysis for diabetes  02/09/2023   Complete foot exam   04/10/2023   Medicare Annual Wellness Visit  04/10/2023   Colon Cancer Screening  12/04/2024   DEXA scan (bone density measurement)  Completed   HPV Vaccine  Aged Out  *Topic was  postponed. The date shown is not the original due date.

## 2022-04-09 ENCOUNTER — Ambulatory Visit (INDEPENDENT_AMBULATORY_CARE_PROVIDER_SITE_OTHER): Payer: Medicare Other | Admitting: *Deleted

## 2022-04-09 ENCOUNTER — Encounter: Payer: Self-pay | Admitting: Internal Medicine

## 2022-04-09 ENCOUNTER — Ambulatory Visit (INDEPENDENT_AMBULATORY_CARE_PROVIDER_SITE_OTHER): Payer: Medicare Other | Admitting: Internal Medicine

## 2022-04-09 VITALS — BP 122/80 | HR 56 | Temp 97.5°F | Ht 64.0 in | Wt 147.0 lb

## 2022-04-09 DIAGNOSIS — Z0000 Encounter for general adult medical examination without abnormal findings: Secondary | ICD-10-CM

## 2022-04-09 DIAGNOSIS — K5909 Other constipation: Secondary | ICD-10-CM

## 2022-04-09 DIAGNOSIS — E785 Hyperlipidemia, unspecified: Secondary | ICD-10-CM

## 2022-04-09 DIAGNOSIS — I10 Essential (primary) hypertension: Secondary | ICD-10-CM | POA: Diagnosis not present

## 2022-04-09 DIAGNOSIS — Z599 Problem related to housing and economic circumstances, unspecified: Secondary | ICD-10-CM

## 2022-04-09 DIAGNOSIS — E1169 Type 2 diabetes mellitus with other specified complication: Secondary | ICD-10-CM | POA: Diagnosis not present

## 2022-04-09 DIAGNOSIS — K219 Gastro-esophageal reflux disease without esophagitis: Secondary | ICD-10-CM | POA: Diagnosis not present

## 2022-04-09 DIAGNOSIS — E119 Type 2 diabetes mellitus without complications: Secondary | ICD-10-CM

## 2022-04-09 MED ORDER — DEXLANSOPRAZOLE 60 MG PO CPDR: 60.0000 mg | DELAYED_RELEASE_CAPSULE | Freq: Every day | ORAL | 3 refills | Status: DC

## 2022-04-09 NOTE — Progress Notes (Unsigned)
   Subjective:   Patient ID: Monica [name], female    DOB: Aug 17, 1940, 81 y.o.   MRN: [id number]  HPI The patient is here for physical.  PMH, [facility], social history reviewed and updated  Review of Systems  Constitutional: Negative.   HENT: Negative.    Eyes: Negative.   Respiratory:  Negative for cough, chest tightness and shortness of breath.   Cardiovascular:  Negative for chest pain, palpitations and leg swelling.  Gastrointestinal:  Negative for abdominal distention, abdominal pain, constipation, diarrhea, nausea and vomiting.  Musculoskeletal: Negative.   Skin: Negative.   Neurological: Negative.   Psychiatric/Behavioral: Negative.      Objective:  Physical Exam Constitutional:      Appearance: She is well-developed.  HENT:     Head: Normocephalic and atraumatic.  Cardiovascular:     Rate and Rhythm: Normal rate and regular rhythm.  Pulmonary:     Effort: Pulmonary effort is normal. No respiratory distress.     Breath sounds: Normal breath sounds. No wheezing or rales.  Abdominal:     General: Bowel sounds are normal. There is no distension.     Palpations: Abdomen is soft.     Tenderness: There is no abdominal tenderness. There is no rebound.  Musculoskeletal:     Cervical back: Normal range of motion.  Skin:    General: Skin is warm and dry.     Comments: Foot exam done  Neurological:     Mental Status: She is alert and oriented to person, place, and time.     Coordination: Coordination normal.     Vitals:   04/09/22 0957  BP: 122/80  Pulse: (!) 56  Temp: (!) 97.5 F (36.4 C)  TempSrc: Oral  SpO2: 97%  Weight: 147 lb (66.7 kg)  Height: '5\' 4"'$  (1.626 m)    Assessment & Plan:

## 2022-04-09 NOTE — Assessment & Plan Note (Signed)
Foot exam done, reviewed labs which are stable with her. Eye exam in a few weeks. Counseled about diet and exercise. On statin.

## 2022-04-09 NOTE — Assessment & Plan Note (Signed)
Taking pravastatin 20 mg daily and well controlled. Continue.

## 2022-04-09 NOTE — Patient Instructions (Signed)
Keep exercising to keep your heart and lungs healthy.

## 2022-04-10 ENCOUNTER — Telehealth: Payer: Self-pay | Admitting: *Deleted

## 2022-04-10 ENCOUNTER — Telehealth: Payer: Medicare Other

## 2022-04-10 NOTE — Progress Notes (Signed)
  Care Coordination  Outreach Note  04/10/2022 Name: Monica [name] MRN: [id number] DOB: 03-16-1941   Care Coordination Outreach Attempts: An unsuccessful telephone outreach was attempted today to offer the patient information about available care coordination services as a benefit of their health plan.   Referral received   Follow Up Plan:  Additional outreach attempts will be made to offer the patient care coordination information and services.   Encounter Outcome:  No Answer  Julian Hy, [place] Direct Dial: [phone]

## 2022-04-10 NOTE — Progress Notes (Signed)
  Care Coordination   Note   04/10/2022 Name: [name] MRN: [id number] DOB: 01-May-1941  [name] is a 81 y.o. year old female who sees [name], MD for primary care. I reached out to Eulah Citizen by phone today to offer care coordination services.  [name] was given information about Care Coordination services today including:   The Care Coordination services include support from the care team which includes your Nurse Coordinator, Clinical Social Worker, or Pharmacist.  The Care Coordination team is here to help remove barriers to the health concerns and goals most important to you. Care Coordination services are voluntary, and the patient may decline or stop services at any time by request to their care team member.   Care Coordination Consent Status: Patient agreed to services and verbal consent obtained.   Follow up plan:  Telephone appointment with care coordination team member scheduled for:  04/12/2022  Encounter Outcome:  Pt. Scheduled from referral   Julian Hy, [place] Direct Dial: [phone]

## 2022-04-11 ENCOUNTER — Ambulatory Visit: Payer: Self-pay | Admitting: Licensed Clinical Social Worker

## 2022-04-11 NOTE — Patient Instructions (Signed)
Visit Information  Thank you for taking time to visit with me today. Please don't hesitate to contact me if I can be of assistance to you.   Following are the goals we discussed today:   Goals Addressed               This Visit's Progress     Care Coordination Activiites- Utilities (pt-stated)        Patient stated she has an incident with her water leaking. Patient hasn't received a bill yet, but anticipating it being high. SW educated patient on DSS utility assistance CIP/ LIEAP. Patient stated she receives assistance once a year. SW advise when patient receives bill to take bill to DSS and explain situation and see if DSS will assist with water. SW explain no assistance is available until bill is available. SW explain for a new SW may be at clinic when bill becomes available. Patient stated she understood.           Patient verbalizes understanding of instructions and care plan provided today and agrees to view in [place]. Active MyChart status and patient understanding of how to access instructions and care plan via MyChart confirmed with patient.     No further follow up required: .  Milus Height, Arita Miss, MSW, [place]  Social Worker [facility] Care Management  [phone]

## 2022-04-11 NOTE — Patient Outreach (Signed)
  Care Coordination   Initial Visit Note   04/11/2022 Name: ARANTZA DARRINGTON MRN: [id number] DOB: July 13, 1940  KODY VIGIL is a 81 y.o. year old female who sees [name], MD for primary care. I spoke with  Eulah Citizen by phone today.  What matters to the patients health and wellness today?  Utility    Goals Addressed               This Visit's Progress     Care Coordination Activiites- Utilities (pt-stated)        Patient stated she has an incident with her water leaking. Patient hasn't received a bill yet, but anticipating it being high. SW educated patient on DSS utility assistance CIP/ LIEAP. Patient stated she receives assistance once a year. SW advise when patient receives bill to take bill to DSS and explain situation and see if DSS will assist with water. SW explain no assistance is available until bill is available. SW explain for a new SW may be at clinic when bill becomes available. Patient stated she understood.         SDOH assessments and interventions completed:  Yes     Care Coordination Interventions:  Yes, provided   Follow up plan: No further intervention required.   Encounter Outcome:  Pt. Visit Completed

## 2022-04-11 NOTE — Assessment & Plan Note (Signed)
BP at goal on amlodipine 2.5 mg daily. Continue at current dosing.

## 2022-04-11 NOTE — Assessment & Plan Note (Signed)
Using otc and overall controlled. Occasional flare with more constipation.

## 2022-04-11 NOTE — Assessment & Plan Note (Signed)
More symptoms since reduction in dose to dexilant 30 mg daily. New rx done for dexilant 60 mg daily which previously she was controlled with. She will let us know in 1-2 weeks if she is having improvement.

## 2022-04-11 NOTE — Assessment & Plan Note (Signed)
Flu shot declines. Covid-19 counseled. Pneumonia declines. Shingrix counseled to get at pharmacy. Colonoscopy up to date. Mammogram up to date, pap smear aged out and dexa complete. Counseled about sun safety and mole surveillance. Counseled about the dangers of distracted driving. Given 10 year screening recommendations.

## 2022-04-14 ENCOUNTER — Other Ambulatory Visit: Payer: Self-pay | Admitting: Internal Medicine

## 2022-05-17 ENCOUNTER — Other Ambulatory Visit: Payer: Self-pay | Admitting: Internal Medicine

## 2022-05-28 ENCOUNTER — Encounter (HOSPITAL_COMMUNITY): Payer: Self-pay | Admitting: Hematology and Oncology

## 2022-06-05 ENCOUNTER — Ambulatory Visit (INDEPENDENT_AMBULATORY_CARE_PROVIDER_SITE_OTHER): Payer: 59 | Admitting: Internal Medicine

## 2022-06-05 ENCOUNTER — Encounter: Payer: Self-pay | Admitting: Internal Medicine

## 2022-06-05 VITALS — BP 110/78 | HR 81 | Temp 98.3°F | Ht 64.0 in | Wt 148.4 lb

## 2022-06-05 DIAGNOSIS — G44209 Tension-type headache, unspecified, not intractable: Secondary | ICD-10-CM

## 2022-06-05 DIAGNOSIS — N644 Mastodynia: Secondary | ICD-10-CM | POA: Diagnosis not present

## 2022-06-05 DIAGNOSIS — R519 Headache, unspecified: Secondary | ICD-10-CM | POA: Insufficient documentation

## 2022-06-05 NOTE — Patient Instructions (Addendum)
You can try claritin over the counter to help with nose dripping at night time.  We will get a mammogram to check the breast. It is okay to use warm water or heat on this area.  It is okay to use tylenol for the headaches. Try meditation or stress relief to see if this helps.

## 2022-06-05 NOTE — Assessment & Plan Note (Signed)
Maybe 1 headache per week going on for some time (maybe a few months she is unsure). She is under a lot of stress and she thinks this may be related. Sounds to be tension headache and tylenol addresses. Okay to continue. No imaging needed today.

## 2022-06-05 NOTE — Assessment & Plan Note (Signed)
Suspect resolving hematoma of chest wall muscle however will order mammogram diagnostic to be thorough. Last mammogram 2-3 years ago.

## 2022-06-05 NOTE — Progress Notes (Signed)
   Subjective:   Patient ID: Monica [name], female    DOB: 11/06/1940, 82 y.o.   MRN: [id number]  HPI The patient is a 82 YO female coming in for new concerns headaches. She is also having pain left breast months after dropping shower head attachment on it.   Review of Systems  Constitutional: Negative.   HENT: Negative.    Eyes: Negative.   Respiratory:  Negative for cough, chest tightness and shortness of breath.   Cardiovascular:  Negative for chest pain, palpitations and leg swelling.       Breast pain  Gastrointestinal:  Negative for abdominal distention, abdominal pain, constipation, diarrhea, nausea and vomiting.  Musculoskeletal: Negative.   Skin: Negative.   Neurological:  Positive for headaches.  Psychiatric/Behavioral: Negative.      Objective:  Physical Exam Constitutional:      Appearance: She is well-developed.  HENT:     Head: Normocephalic and atraumatic.  Cardiovascular:     Rate and Rhythm: Normal rate and regular rhythm.     Comments: Pain left breast superior without mass detected. Pulmonary:     Effort: Pulmonary effort is normal. No respiratory distress.     Breath sounds: Normal breath sounds. No wheezing or rales.  Abdominal:     General: Bowel sounds are normal. There is no distension.     Palpations: Abdomen is soft.     Tenderness: There is no abdominal tenderness. There is no rebound.  Musculoskeletal:     Cervical back: Normal range of motion.  Skin:    General: Skin is warm and dry.  Neurological:     Mental Status: She is alert and oriented to person, place, and time.     Coordination: Coordination normal.     Vitals:   06/05/22 0923  BP: 110/78  Pulse: 81  Temp: 98.3 F (36.8 C)  TempSrc: Oral  SpO2: 96%  Weight: 148 lb 6.4 oz (67.3 kg)  Height: '5\' 4"'$  (1.626 m)    Assessment & Plan:  Visit time 20 minutes in face to face communication with patient and coordination of care, additional 10 minutes spent in record review,  coordination or care, ordering tests, communicating/referring to other healthcare professionals, documenting in medical records all on the same day of the visit for total time 30 minutes spent on the visit.

## 2022-06-12 DIAGNOSIS — H401132 Primary open-angle glaucoma, bilateral, moderate stage: Secondary | ICD-10-CM | POA: Diagnosis not present

## 2022-06-15 ENCOUNTER — Other Ambulatory Visit: Payer: Self-pay | Admitting: Internal Medicine

## 2022-06-15 DIAGNOSIS — N644 Mastodynia: Secondary | ICD-10-CM

## 2022-06-25 ENCOUNTER — Ambulatory Visit
Admission: RE | Admit: 2022-06-25 | Discharge: 2022-06-25 | Disposition: A | Discharge status: Home / Self Care | Payer: 59 | Source: Ambulatory Visit | Attending: Internal Medicine | Admitting: Internal Medicine

## 2022-06-25 ENCOUNTER — Encounter (HOSPITAL_COMMUNITY): Payer: Self-pay | Admitting: Hematology and Oncology

## 2022-06-25 DIAGNOSIS — R928 Other abnormal and inconclusive findings on diagnostic imaging of breast: Secondary | ICD-10-CM | POA: Diagnosis not present

## 2022-06-25 DIAGNOSIS — N644 Mastodynia: Secondary | ICD-10-CM

## 2022-06-25 DIAGNOSIS — N6489 Other specified disorders of breast: Secondary | ICD-10-CM | POA: Diagnosis not present

## 2022-06-25 LAB — HM MAMMOGRAPHY

## 2022-06-26 ENCOUNTER — Encounter: Payer: Self-pay | Admitting: Internal Medicine

## 2022-07-12 DIAGNOSIS — H2512 Age-related nuclear cataract, left eye: Secondary | ICD-10-CM | POA: Diagnosis not present

## 2022-07-12 DIAGNOSIS — H2513 Age-related nuclear cataract, bilateral: Secondary | ICD-10-CM | POA: Diagnosis not present

## 2022-07-12 DIAGNOSIS — H40033 Anatomical narrow angle, bilateral: Secondary | ICD-10-CM | POA: Diagnosis not present

## 2022-07-18 ENCOUNTER — Other Ambulatory Visit: Payer: Self-pay | Admitting: Internal Medicine

## 2022-07-27 DIAGNOSIS — H2512 Age-related nuclear cataract, left eye: Secondary | ICD-10-CM | POA: Diagnosis not present

## 2022-07-27 DIAGNOSIS — H40032 Anatomical narrow angle, left eye: Secondary | ICD-10-CM | POA: Diagnosis not present

## 2022-08-07 DIAGNOSIS — H2511 Age-related nuclear cataract, right eye: Secondary | ICD-10-CM | POA: Diagnosis not present

## 2022-08-07 DIAGNOSIS — H5711 Ocular pain, right eye: Secondary | ICD-10-CM | POA: Diagnosis not present

## 2022-08-07 DIAGNOSIS — H40031 Anatomical narrow angle, right eye: Secondary | ICD-10-CM | POA: Diagnosis not present

## 2022-08-19 ENCOUNTER — Other Ambulatory Visit: Payer: Self-pay | Admitting: Internal Medicine

## 2022-10-22 ENCOUNTER — Other Ambulatory Visit: Payer: Self-pay | Admitting: Internal Medicine

## 2022-10-29 DIAGNOSIS — H3530 Unspecified macular degeneration: Secondary | ICD-10-CM | POA: Diagnosis not present

## 2022-10-30 ENCOUNTER — Encounter: Payer: Self-pay | Admitting: Internal Medicine

## 2022-10-30 ENCOUNTER — Ambulatory Visit (INDEPENDENT_AMBULATORY_CARE_PROVIDER_SITE_OTHER): Payer: 59 | Admitting: Internal Medicine

## 2022-10-30 VITALS — BP 160/100 | HR 68 | Temp 98.2°F | Ht 64.0 in | Wt 146.0 lb

## 2022-10-30 DIAGNOSIS — E1169 Type 2 diabetes mellitus with other specified complication: Secondary | ICD-10-CM | POA: Diagnosis not present

## 2022-10-30 DIAGNOSIS — K5909 Other constipation: Secondary | ICD-10-CM | POA: Diagnosis not present

## 2022-10-30 DIAGNOSIS — E785 Hyperlipidemia, unspecified: Secondary | ICD-10-CM

## 2022-10-30 DIAGNOSIS — I10 Essential (primary) hypertension: Secondary | ICD-10-CM | POA: Diagnosis not present

## 2022-10-30 DIAGNOSIS — Z7984 Long term (current) use of oral hypoglycemic drugs: Secondary | ICD-10-CM

## 2022-10-30 DIAGNOSIS — E118 Type 2 diabetes mellitus with unspecified complications: Secondary | ICD-10-CM

## 2022-10-30 LAB — COMPREHENSIVE METABOLIC PANEL
ALT: 10 U/L (ref 0–35)
AST: 17 U/L (ref 0–37)
Albumin: 4.5 g/dL (ref 3.5–5.2)
Alkaline Phosphatase: 59 U/L (ref 39–117)
BUN: 11 mg/dL (ref 6–23)
CO2: 29 mEq/L (ref 19–32)
Calcium: 9.9 mg/dL (ref 8.4–10.5)
Chloride: 104 mEq/L (ref 96–112)
Creatinine, Ser: 0.89 mg/dL (ref 0.40–1.20)
GFR: 61.29 mL/min (ref >60.00)
Glucose, Bld: 107 mg/dL — ABNORMAL HIGH (ref 70–99)
Potassium: 4.1 mEq/L (ref 3.5–5.1)
Sodium: 141 mEq/L (ref 135–145)
Total Bilirubin: 0.9 mg/dL (ref 0.2–1.2)
Total Protein: 8.1 g/dL (ref 6.0–8.3)

## 2022-10-30 LAB — CBC
HCT: 40.6 % (ref 36.0–46.0)
Hemoglobin: 13.1 g/dL (ref 12.0–15.0)
MCHC: 32.4 g/dL (ref 30.0–36.0)
MCV: 92.4 fl (ref 78.0–100.0)
Platelets: 251 10*3/uL (ref 150.0–400.0)
RBC: 4.39 Mil/uL (ref 3.87–5.11)
RDW: 14 % (ref 11.5–15.5)
WBC: 3.9 10*3/uL — ABNORMAL LOW (ref 4.0–10.5)

## 2022-10-30 LAB — LIPID PANEL
Cholesterol: 154 mg/dL (ref 0–200)
HDL: 56.9 mg/dL (ref >39.00)
LDL Cholesterol: 81 mg/dL (ref 0–99)
NonHDL: 96.81
Total CHOL/HDL Ratio: 3
Triglycerides: 81 mg/dL (ref 0.0–149.0)
VLDL: 16.2 mg/dL (ref 0.0–40.0)

## 2022-10-30 LAB — MICROALBUMIN / CREATININE URINE RATIO
Creatinine,U: 45.2 mg/dL
Microalb Creat Ratio: 1.9 mg/g (ref 0.0–30.0)
Microalb, Ur: 0.9 mg/dL (ref 0.0–1.9)

## 2022-10-30 LAB — HEMOGLOBIN A1C: Hgb A1c MFr Bld: 7 % — ABNORMAL HIGH (ref 4.6–6.5)

## 2022-10-30 NOTE — Assessment & Plan Note (Signed)
Taking miralax daily and recently took 2 dulcolax and this caused intestinal cramping and pain. Encouraged to not take this again and if needed take 1/2 to 1 instead of 2.

## 2022-10-30 NOTE — Assessment & Plan Note (Signed)
Checking lipid panel and adjust pravastatin 20 mg daily as needed for LDL <100.  

## 2022-10-30 NOTE — Assessment & Plan Note (Signed)
Checking HgA1c and adjust as needed. Diet controlled. Checking lipid panel and CMP and microalbumin to creatinine rtio. On statin not on ACE-I. Adjust as needed.

## 2022-10-30 NOTE — Assessment & Plan Note (Addendum)
BP moderately elevated today previously at goal on amlodipine 2.5 mg daily. Checking CMP and adjust as needed.

## 2022-10-30 NOTE — Progress Notes (Signed)
   Subjective:   Patient ID: Monica [name], female    DOB: August 14, 1940, 82 y.o.   MRN: [id number]  HPI The patient is an 82 YO female coming in for follow up. Still dealing with increased stress.   Review of Systems  Constitutional: Negative.   HENT: Negative.    Eyes: Negative.   Respiratory:  Negative for cough, chest tightness and shortness of breath.   Cardiovascular:  Negative for chest pain, palpitations and leg swelling.  Gastrointestinal:  Negative for abdominal distention, abdominal pain, constipation, diarrhea, nausea and vomiting.  Musculoskeletal: Negative.   Skin: Negative.   Neurological: Negative.   Psychiatric/Behavioral: Negative.      Objective:  Physical Exam Constitutional:      Appearance: She is well-developed.  HENT:     Head: Normocephalic and atraumatic.  Cardiovascular:     Rate and Rhythm: Normal rate and regular rhythm.  Pulmonary:     Effort: Pulmonary effort is normal. No respiratory distress.     Breath sounds: Normal breath sounds. No wheezing or rales.  Abdominal:     General: Bowel sounds are normal. There is no distension.     Palpations: Abdomen is soft.     Tenderness: There is no abdominal tenderness. There is no rebound.  Musculoskeletal:     Cervical back: Normal range of motion.  Skin:    General: Skin is warm and dry.  Neurological:     Mental Status: She is alert and oriented to person, place, and time.     Coordination: Coordination normal.     Vitals:   10/30/22 1008 10/30/22 1012 10/30/22 1103  BP: (!) 180/100 (!) 180/100 (!) 160/100  Pulse: 68    Temp: 98.2 F (36.8 C)    TempSrc: Oral    SpO2: 99%    Weight: 146 lb (66.2 kg)    Height: 5\' 4"  (1.626 m)      Assessment & Plan:

## 2022-11-14 ENCOUNTER — Other Ambulatory Visit: Payer: Self-pay | Admitting: Internal Medicine

## 2022-11-19 DIAGNOSIS — H401132 Primary open-angle glaucoma, bilateral, moderate stage: Secondary | ICD-10-CM | POA: Diagnosis not present

## 2022-12-04 DIAGNOSIS — H401132 Primary open-angle glaucoma, bilateral, moderate stage: Secondary | ICD-10-CM | POA: Diagnosis not present

## 2022-12-17 ENCOUNTER — Ambulatory Visit (INDEPENDENT_AMBULATORY_CARE_PROVIDER_SITE_OTHER): Payer: 59 | Admitting: Internal Medicine

## 2022-12-17 ENCOUNTER — Encounter: Payer: Self-pay | Admitting: Internal Medicine

## 2022-12-17 VITALS — BP 140/96 | HR 59 | Temp 98.0°F | Ht 64.0 in | Wt 146.0 lb

## 2022-12-17 DIAGNOSIS — I10 Essential (primary) hypertension: Secondary | ICD-10-CM

## 2022-12-17 MED ORDER — AMLODIPINE BESYLATE 5 MG PO TABS: 5.0000 mg | ORAL_TABLET | Freq: Every day | ORAL | 3 refills | Status: DC

## 2022-12-17 NOTE — Assessment & Plan Note (Signed)
BP is moderately elevated and we will increase amlodipine to 5 mg daily. New rx done and she will take 2 pills of her 2.5 mg until gone. Follow up 4-6 weeks for recheck.

## 2022-12-17 NOTE — Patient Instructions (Signed)
We will increase the blood pressure medicine amlodipine to 5 mg daily. Take 2 pills of what you have at home until it is gone. We have sent in the new dose so when you refill it you will take 1 pill daily.

## 2022-12-17 NOTE — Progress Notes (Signed)
   Subjective:   Patient ID: Monica [name], female    DOB: February 03, 1941, 82 y.o.   MRN: [id number]  HPI The patient is an 82 YO female coming in for follow up. Having some headaches.  Review of Systems  Constitutional: Negative.   HENT: Negative.    Eyes: Negative.   Respiratory:  Negative for cough, chest tightness and shortness of breath.   Cardiovascular:  Negative for chest pain, palpitations and leg swelling.  Gastrointestinal:  Negative for abdominal distention, abdominal pain, constipation, diarrhea, nausea and vomiting.  Musculoskeletal: Negative.   Skin: Negative.   Neurological:  Positive for headaches.  Psychiatric/Behavioral: Negative.      Objective:  Physical Exam Constitutional:      Appearance: She is well-developed.  HENT:     Head: Normocephalic and atraumatic.  Cardiovascular:     Rate and Rhythm: Normal rate and regular rhythm.  Pulmonary:     Effort: Pulmonary effort is normal. No respiratory distress.     Breath sounds: Normal breath sounds. No wheezing or rales.  Abdominal:     General: Bowel sounds are normal. There is no distension.     Palpations: Abdomen is soft.     Tenderness: There is no abdominal tenderness. There is no rebound.  Musculoskeletal:     Cervical back: Normal range of motion.  Skin:    General: Skin is warm and dry.  Neurological:     Mental Status: She is alert and oriented to person, place, and time.     Coordination: Coordination normal.     Vitals:   12/17/22 0906 12/17/22 0909  BP: (!) 140/96 (!) 140/96  Pulse: (!) 59   Temp: 98 F (36.7 C)   TempSrc: Oral   SpO2: 98%   Weight: 146 lb (66.2 kg)   Height: 5\' 4"  (1.626 m)     Assessment & Plan:

## 2023-01-02 DIAGNOSIS — H401132 Primary open-angle glaucoma, bilateral, moderate stage: Secondary | ICD-10-CM | POA: Diagnosis not present

## 2023-01-28 ENCOUNTER — Other Ambulatory Visit: Payer: Self-pay | Admitting: Internal Medicine

## 2023-02-05 ENCOUNTER — Encounter: Payer: Self-pay | Admitting: Internal Medicine

## 2023-02-05 ENCOUNTER — Ambulatory Visit (INDEPENDENT_AMBULATORY_CARE_PROVIDER_SITE_OTHER): Payer: 59 | Admitting: Internal Medicine

## 2023-02-05 VITALS — BP 140/80 | HR 66 | Temp 98.5°F | Ht 64.0 in | Wt 142.0 lb

## 2023-02-05 DIAGNOSIS — K05219 Aggressive periodontitis, localized, unspecified severity: Secondary | ICD-10-CM

## 2023-02-05 MED ORDER — CEPHALEXIN 500 MG PO CAPS: 500.0000 mg | ORAL_CAPSULE | Freq: Two times a day (BID) | ORAL | 0 refills | Status: AC

## 2023-02-05 NOTE — Patient Instructions (Signed)
We have sent in an antibiotic for the jaw called keflex. Take 1 pill twice a day for 5 days.

## 2023-02-05 NOTE — Progress Notes (Unsigned)
Subjective:   Patient ID: Monica [name], female    DOB: 04/27/1941, 82 y.o.   MRN: [id number]  HPI The patient is an 82 YO female coming in for knot near jawline  Review of Systems  Objective:  Physical Exam  Vitals:   02/05/23 1604 02/05/23 1609  BP: (!) 140/80 (!) 140/80  Pulse: 66   Temp: 98.5 F (36.9 C)   TempSrc: Oral   SpO2: 96%   Weight: 142 lb (64.4 kg)   Height: 5\' 4"  (1.626 m)     Assessment & Plan:

## 2023-02-06 DIAGNOSIS — K05219 Aggressive periodontitis, localized, unspecified severity: Secondary | ICD-10-CM | POA: Insufficient documentation

## 2023-02-06 NOTE — Assessment & Plan Note (Signed)
Early gum infection with firmness and swelling external left jawline and internal with redness and tenderness. Rx keflex 5 day course and if no improvement see dentist to assess.

## 2023-02-11 DIAGNOSIS — H401132 Primary open-angle glaucoma, bilateral, moderate stage: Secondary | ICD-10-CM | POA: Diagnosis not present

## 2023-03-05 DIAGNOSIS — H35361 Drusen (degenerative) of macula, right eye: Secondary | ICD-10-CM | POA: Diagnosis not present

## 2023-03-12 ENCOUNTER — Ambulatory Visit: Payer: 59 | Admitting: Internal Medicine

## 2023-03-20 ENCOUNTER — Ambulatory Visit: Payer: 59 | Attending: Internal Medicine | Admitting: Internal Medicine

## 2023-03-20 ENCOUNTER — Encounter: Payer: Self-pay | Admitting: Internal Medicine

## 2023-03-20 VITALS — BP 120/78 | HR 60 | Ht 64.25 in | Wt 141.6 lb

## 2023-03-20 DIAGNOSIS — I10 Essential (primary) hypertension: Secondary | ICD-10-CM | POA: Diagnosis not present

## 2023-03-20 DIAGNOSIS — I351 Nonrheumatic aortic (valve) insufficiency: Secondary | ICD-10-CM | POA: Diagnosis not present

## 2023-03-20 NOTE — Progress Notes (Signed)
Cardiology Office Note  Date: 03/20/2023   ID: GARRY BOCHICCHIO, DOB Apr 10, 1941, MRN [id number]  PCP:  Myrlene Broker, MD  Cardiologist:  None Electrophysiologist:  None    History of Present Illness: Monica [name] is a 82 y.o. female known to have HTN, HLD, DM2,ll presented to the cardiology clinic for follow-up visit.   She was previously referred to the cardiology clinic for evaluation of palpitations.  Event monitor for 1 week showed no evidence of arrhythmias.  Echo showed normal LVEF and mild aortic valve regurgitation.  She is here for follow-up visit.  She has a lot going on in her life, her daughter had stroke last year, December 2023 and her son is undergoing chemotherapy.  She gets chest tightness/pressure when she gets stressed out but she also walks for 30 minutes to 1 hour for 1 day in a week.  During this walking, she did not get any chest pains.  Denies having DOE, orthopnea, PND, leg swelling.  Compliant with medications and has no side effects.  She also reported palpitations getting better when there is no stress.  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   DEPRESSION, SITUATIONAL    GERD (gastroesophageal reflux disease)    w/ HH (EGD 2010)   GLAUCOMA    Hypertension    Iron deficiency anemia due to chronic blood loss 02/05/2014   LOW BACK PAIN, CHRONIC    Mixed hyperlipidemia    OSTEOPENIA    Routine general medical examination at a health care facility 06/18/2016   Stricture and stenosis of esophagus 05/14/2004    Past Surgical History:  Procedure Laterality Date   BACK SURGERY     COLONOSCOPY  09/21/2013   [facility]   ESOPHAGEAL MANOMETRY N/A 02/09/2013   Procedure: ESOPHAGEAL MANOMETRY (EM);  Surgeon: Mardella Layman, MD;  Location: [facility];  Service: Endoscopy;  Laterality: N/A;   EYE SURGERY     frozen shoulder surgery Left    HEMORROIDECTOMY      HYSTEROSCOPY WITH D & C  07/20/2011   Procedure: DILATATION AND CURETTAGE /HYSTEROSCOPY;  Surgeon: Bing Plume, MD;  Location: [facility];  Service: Gynecology;  Laterality: N/A;   POLYPECTOMY  07/20/2011   Procedure: POLYPECTOMY;  Surgeon: Bing Plume, MD;  Location: [facility];  Service: Gynecology;  Laterality: N/A;   POLYPECTOMY     ROTATOR CUFF REPAIR     right    TUBAL LIGATION      Current Outpatient Medications  Medication Sig Dispense Refill   amLODipine (NORVASC) 5 MG tablet Take 1 tablet (5 mg total) by mouth daily. 90 tablet 3   aspirin 81 MG tablet Take 81 mg by mouth daily.     Calcium Carbonate-Vitamin D3 600-400 MG-UNIT TABS Take 2 tablets by mouth daily.     dexlansoprazole (DEXILANT) 60 MG capsule Take 1 capsule (60 mg total) by mouth daily. 90 capsule 3   LUMIGAN 0.01 % SOLN Place 1 drop into both eyes at bedtime.     meloxicam (MOBIC) 7.5 MG tablet Take 1 tablet by mouth once daily 90 tablet 0   phenylephrine-shark liver oil-mineral oil-petrolatum (PREPARATION H) 0.25-14-74.9 % rectal ointment Place 1 application rectally 2 (two) times daily as needed for hemorrhoids.     Polyethyl Glycol-Propyl Glycol (SYSTANE OP) Apply 1 drop to eye 3 (three) times daily as needed.  Polyethylene Glycol 3350 (MIRALAX PO) Take 17 g by mouth daily.     pravastatin (PRAVACHOL) 20 MG tablet Take 1 tablet by mouth once daily 90 tablet 0   No current facility-administered medications for this visit.   Allergies:  Codeine and Hydrochlorothiazide   Social History: The patient  reports that she has never smoked. She has never used smokeless tobacco. She reports that she does not drink alcohol and does not use drugs.   Family History: The patient's family history includes Breast cancer in her sister; Colon cancer (age of onset: 28) in her sister; Diabetes in her brother and mother; Heart failure in her father.   ROS:  Please see the history of present illness. Otherwise, complete review  of systems is positive for none.  All other systems are reviewed and negative.   Physical Exam: VS:  There were no vitals taken for this visit., BMI There is no height or weight on file to calculate BMI.  Wt Readings from Last 3 Encounters:  02/05/23 142 lb (64.4 kg)  12/17/22 146 lb (66.2 kg)  10/30/22 146 lb (66.2 kg)    General: Patient appears comfortable at rest. HEENT: Conjunctiva and lids normal, oropharynx clear with moist mucosa. Neck: Supple, no elevated JVP or carotid bruits, no thyromegaly. Lungs: Clear to auscultation, nonlabored breathing at rest. Cardiac: Regular rate and rhythm, no S3 or significant systolic murmur, no pericardial rub. Abdomen: Soft, nontender, no hepatomegaly, bowel sounds present, no guarding or rebound. Extremities: No pitting edema, distal pulses 2+. Skin: Warm and dry. Musculoskeletal: No kyphosis. Neuropsychiatric: Alert and oriented x3, affect grossly appropriate.  ECG:  An ECG dated 03/06/22 was personally reviewed today and demonstrated:  NSR and no ST-T changes  Recent Labwork: 10/30/2022: ALT 10; AST 17; BUN 11; Creatinine, Ser 0.89; Hemoglobin 13.1; Platelets 251.0; Potassium 4.1; Sodium 141     Component Value Date/Time   CHOL 154 10/30/2022 1111   TRIG 81.0 10/30/2022 1111   HDL 56.90 10/30/2022 1111   CHOLHDL 3 10/30/2022 1111   VLDL 16.2 10/30/2022 1111   LDLCALC 81 10/30/2022 1111   LDLCALC 62 12/15/2019 1348    Other Studies Reviewed Today: Zio X 1 week 11 episodes of SVT, longest lasting 17 seconds with average rate 128 bpm Occasional PACs (1.3% of beats)  Assessment and Plan:   Palpitations, resolved: Event monitor from 05/2021 showed no evidence of arrhythmias.  Patient reported palpitations occurred secondary to stress.  Mild aortic valve regurgitation in 2024: Asymptomatic, compensated, will repeat echocardiogram in 2027.  HTN, controlled: Continue amlodipine 2.5 mg once daily.  HLD DM2, at goal: Continue  pravastatin 20 mg nightly, LDL goal less than 100.  Primary prevention of CAD: No indication of aspirin, patient prefers to take it.   I have spent a total of 30 minutes with patient reviewing chart, EKGs, labs and examining patient as well as establishing an assessment and plan that was discussed with the patient.     Disposition:  Follow up  one year  Signed, Taison Celani Verne Spurr, MD, 03/20/2023 10:00 AM    [facility] [address] [place], [place] [address]

## 2023-03-20 NOTE — Patient Instructions (Addendum)

## 2023-04-15 ENCOUNTER — Other Ambulatory Visit: Payer: Self-pay | Admitting: Internal Medicine

## 2023-04-15 DIAGNOSIS — H401132 Primary open-angle glaucoma, bilateral, moderate stage: Secondary | ICD-10-CM | POA: Diagnosis not present

## 2023-05-03 ENCOUNTER — Other Ambulatory Visit: Payer: Self-pay | Admitting: Internal Medicine

## 2023-05-16 DIAGNOSIS — H401132 Primary open-angle glaucoma, bilateral, moderate stage: Secondary | ICD-10-CM | POA: Diagnosis not present

## 2023-05-23 ENCOUNTER — Other Ambulatory Visit: Payer: Self-pay | Admitting: Obstetrics and Gynecology

## 2023-05-23 DIAGNOSIS — Z1231 Encounter for screening mammogram for malignant neoplasm of breast: Secondary | ICD-10-CM

## 2023-06-27 ENCOUNTER — Ambulatory Visit
Admission: RE | Admit: 2023-06-27 | Discharge: 2023-06-27 | Disposition: A | Discharge status: Home / Self Care | Payer: 59 | Source: Ambulatory Visit | Attending: Obstetrics and Gynecology

## 2023-06-27 DIAGNOSIS — Z1231 Encounter for screening mammogram for malignant neoplasm of breast: Secondary | ICD-10-CM

## 2023-07-15 ENCOUNTER — Other Ambulatory Visit: Payer: Self-pay | Admitting: Internal Medicine

## 2023-07-22 ENCOUNTER — Other Ambulatory Visit: Payer: Self-pay | Admitting: Internal Medicine

## 2023-08-02 ENCOUNTER — Ambulatory Visit: Payer: 59

## 2023-08-02 VITALS — BP 120/80 | HR 61 | Ht 64.25 in | Wt 140.4 lb

## 2023-08-02 DIAGNOSIS — Z780 Asymptomatic menopausal state: Secondary | ICD-10-CM

## 2023-08-02 DIAGNOSIS — Z0000 Encounter for general adult medical examination without abnormal findings: Secondary | ICD-10-CM

## 2023-08-02 NOTE — Patient Instructions (Addendum)
 Ms. Taflinger , Thank you for taking time to come for your Medicare Wellness Visit. I appreciate your ongoing commitment to your health goals. Please review the following plan we discussed and let me know if I can assist you in the future.   Referrals/Orders/Follow-Ups/Clinician Recommendations: It was nice to meet you today.  You are due for an A1C check, a foot exam and a kidney evaluation during your next office visit.  You have an order for:  [x]   Bone Density     Please call for appointment:  [facility] [address] [place], [place] [address] [phone]    Make sure to wear two-piece clothing.  No lotions, powders, or deodorants the day of the appointment. Make sure to bring picture ID and insurance card.  Bring list of medications you are currently taking including any supplements.    This is a list of the screening recommended for you and due dates:  Health Maintenance  Topic Date Due   Pneumonia Vaccine (1 of 2 - PCV) Never done   DTaP/Tdap/Td vaccine (1 - Tdap) Never done   Zoster (Shingles) Vaccine (1 of 2) Never done   Eye exam for diabetics  11/11/2021   Flu Shot  12/13/2022   COVID-19 Vaccine (4 - 2024-25 season) 01/13/2023   Complete foot exam   04/10/2023   Medicare Annual Wellness Visit  04/10/2023   Hemoglobin A1C  05/01/2023   Yearly kidney function blood test for diabetes  10/30/2023   Yearly kidney health urinalysis for diabetes  10/30/2023   Colon Cancer Screening  12/04/2024   DEXA scan (bone density measurement)  Completed   HPV Vaccine  Aged Out    Advanced directives: (Copy Requested) Please bring a copy of your health care power of attorney and living will to the office to be added to your chart at your convenience. You can mail to [facility] [address] [place], [place] [address] or email to [email or web address]  Next Medicare Annual Wellness Visit scheduled for next year: Yes

## 2023-08-02 NOTE — Progress Notes (Signed)
 Subjective:   Monica [name] is a 83 y.o. who presents for a Medicare Wellness preventive visit.  Visit Complete: In person   Persons Participating in Visit: Patient.  AWV Questionnaire: No: Patient Medicare AWV questionnaire was not completed prior to this visit.  Cardiac Risk Factors include: advanced age (>51men, >7 women);hypertension;diabetes mellitus;dyslipidemia     Objective:    Today's Vitals   08/02/23 0830  Weight: 140 lb 6.4 oz (63.7 kg)  Height: 5' 4.25" (1.632 m)   Body mass index is 23.91 kg/m.     08/02/2023    8:42 AM 04/09/2022    1:39 PM 09/19/2018    9:45 AM 08/12/2017    9:36 AM 10/31/2016    1:24 PM 04/30/2016    1:59 PM 04/28/2015   11:15 AM  Advanced Directives  Does Patient Have a Medical Advance Directive? Yes No No No No No No  Type of Estate agent of [place];Living will        Does patient want to make changes to medical advance directive?  No - Patient declined       Copy of Healthcare Power of Attorney in Chart? No - copy requested        Would patient like information on creating a medical advance directive?   Yes (ED - Information included in AVS) Yes (ED - Information included in AVS) No - Patient declined No - Patient declined No - patient declined information    Current Medications (verified) Outpatient Encounter Medications as of 08/02/2023  Medication Sig   amLODipine (NORVASC) 5 MG tablet Take 1 tablet (5 mg total) by mouth daily.   aspirin 81 MG tablet Take 81 mg by mouth daily.   dexlansoprazole (DEXILANT) 60 MG capsule Take 1 capsule by mouth once daily   meloxicam (MOBIC) 7.5 MG tablet Take 1 tablet by mouth once daily   pravastatin (PRAVACHOL) 20 MG tablet Take 1 tablet by mouth once daily   Calcium Carbonate-Vitamin D3 600-400 MG-UNIT TABS Take 2 tablets by mouth daily. (Patient not taking: Reported on 08/02/2023)   LUMIGAN 0.01 % SOLN Place 1 drop into both eyes at bedtime. (Patient not taking: Reported  on 03/20/2023)   phenylephrine-shark liver oil-mineral oil-petrolatum (PREPARATION H) 0.25-14-74.9 % rectal ointment Place 1 application rectally 2 (two) times daily as needed for hemorrhoids. (Patient not taking: Reported on 08/02/2023)   Polyethyl Glycol-Propyl Glycol (SYSTANE OP) Apply 1 drop to eye 3 (three) times daily as needed. (Patient not taking: Reported on 08/02/2023)   Polyethylene Glycol 3350 (MIRALAX PO) Take 17 g by mouth daily. (Patient not taking: Reported on 08/02/2023)   No facility-administered encounter medications on file as of 08/02/2023.    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   ESOPHAGEAL  MANOMETRY N/A 02/09/2013   Procedure: ESOPHAGEAL MANOMETRY (EM);  Surgeon: Mardella Layman, MD;  Location: [facility];  Service: Endoscopy;  Laterality: N/A;   EYE SURGERY     frozen shoulder surgery Left    HEMORROIDECTOMY     HYSTEROSCOPY WITH D & C  07/20/2011   Procedure: DILATATION AND CURETTAGE /HYSTEROSCOPY;  Surgeon: Bing Plume, MD;  Location: [facility];  Service: Gynecology;  Laterality: N/A;   POLYPECTOMY  07/20/2011   Procedure: POLYPECTOMY;  Surgeon: Bing Plume, MD;  Location: [facility];  Service: Gynecology;  Laterality: N/A;   POLYPECTOMY     ROTATOR CUFF REPAIR     right    TUBAL LIGATION     Family History  Problem Relation  Age of Onset   Diabetes Mother    Heart failure Father    Breast cancer Sister    Colon cancer Sister 23   Diabetes Brother    Colon cancer Son    Lung cancer Son    Liver cancer Son    Esophageal cancer Neg Hx    Rectal cancer Neg Hx    Stomach cancer Neg Hx    Colon polyps Neg Hx    Social History   Socioeconomic History   Marital status: Divorced    Spouse name: Not on file   Number of children: 5   Years of education: 12th   Highest education level: Not on file  Occupational History   Occupation: retired  Tobacco Use   Smoking status: Never   Smokeless tobacco: Never  Vaping Use   Vaping status: Never Used  Substance and Sexual Activity   Alcohol use: No    Alcohol/week: 0.0 standard drinks of alcohol   Drug use: No   Sexual activity: Never  Other Topics Concern   Not on file  Social History Narrative   Lives alone.   Social Drivers of Health   Financial Resource Strain: Medium Risk (08/02/2023)   Overall Financial Resource Strain (CARDIA)    Difficulty of Paying Living Expenses: Somewhat hard  Food Insecurity: No Food Insecurity (08/02/2023)   Hunger Vital Sign    Worried About Running Out of Food in the Last Year: Never true    Ran Out of Food in the Last Year: Never true  Transportation Needs: No Transportation Needs (08/02/2023)   PRAPARE - Administrator, Civil Service (Medical): No    Lack of Transportation (Non-Medical): No  Physical Activity: Inactive (08/02/2023)   Exercise Vital Sign    Days of Exercise per Week: 0 days    Minutes of Exercise per Session: 0 min  Stress: No Stress Concern Present (08/02/2023)   [employer] of Occupational Health - Occupational Stress Questionnaire    Feeling of Stress : Only a little  Social Connections: Moderately Integrated (08/02/2023)   Social Connection and Isolation Panel [NHANES]    Frequency of Communication with Friends and Family: More than three times a week    Frequency of Social  Gatherings with Friends and Family: More than three times a week    Attends Religious Services: More than 4 times per year    Active Member of [employer] or Organizations: Yes    Attends Banker Meetings: Never    Marital Status: Divorced    Tobacco Counseling Counseling given: Not Answered    Clinical Intake:  Pre-visit preparation completed: Yes  Pain : No/denies pain     BMI - recorded: 23.91 Nutritional Status: BMI  of 19-24  Normal Nutritional Risks: None  Lab Results  Component Value Date   HGBA1C 7.0 (H) 10/30/2022   HGBA1C 7.2 (H) 02/08/2022   HGBA1C 6.9 (H) 08/08/2021     How often do you need to have someone help you when you read instructions, pamphlets, or other written materials from your doctor or pharmacy?: 1 - Never  Interpreter Needed?: No  Information entered by :: Marielys Trinidad, RMA   Activities of Daily Living     08/02/2023    8:27 AM  In your present state of health, do you have any difficulty performing the following activities:  Hearing? 1  Comment hard to rt ear  Vision? 0  Difficulty concentrating or making decisions? 0  Walking or climbing stairs? 0  Dressing or bathing? 0  Doing errands, shopping? 0  Preparing Food and eating ? N  Using the Toilet? N  In the past six months, have you accidently leaked urine? Y  Do you have problems with loss of bowel control? N  Managing your Medications? N  Managing your Finances? N  Housekeeping or managing your Housekeeping? N    Patient Care Team: Myrlene Broker, MD as PCP - General (Internal Medicine) Marjo Bicker, MD as PCP - Cardiology (Cardiology) Tracey Harries, MD (Obstetrics and Gynecology) Mardella Layman, MD as Consulting Physician (Gastroenterology) Kathyrn Sheriff, [facility] (Inactive) as Pharmacist (Pharmacist) Szabat, Vinnie Level, [facility] (Inactive) (Pharmacist) Szabat, Vinnie Level, [facility] (Inactive) as Pharmacist (Pharmacist) [facility]  Indicate any  recent Medical Services you may have received from other than [facility] providers in the past year (date may be approximate).     Assessment:   This is a routine wellness examination for Monica [name].  Hearing/Vision screen Hearing Screening - Comments:: Some issues with her rt ear Vision Screening - Comments:: Wears eyeglasses   Goals Addressed             This Visit's Progress    Patient Stated   On track    Continue to reach out others and help all that I can. Continue to eat healthy, exercise, enjoy life and worship God.       Depression Screen     08/02/2023    8:46 AM 10/30/2022   10:08 AM 06/05/2022    9:28 AM 04/09/2022    1:30 PM 04/09/2022   10:05 AM 06/10/2020    9:39 AM 09/19/2018    9:32 AM  PHQ 2/9 Scores  PHQ - 2 Score 1 0 0 0 0 0 0  PHQ- 9 Score 1 0 0  0  0    Fall Risk     08/02/2023    8:42 AM 02/05/2023    4:09 PM 10/30/2022   10:12 AM 10/30/2022   10:08 AM 06/05/2022    9:28 AM  Fall Risk   Falls in the past year? 0 0 0 0 0  Number falls in past yr: 0 0 0 0 0  Injury with Fall? 0 0 0 0 0  Risk for fall due to : No Fall Risks    No Fall Risks  Follow up Falls prevention discussed;Falls evaluation completed Falls evaluation completed Falls evaluation completed  Falls evaluation completed    MEDICARE RISK AT HOME:  Medicare Risk at Home Any stairs in or around the home?: No Adequate lighting in your home to reduce risk of falls?: Yes Life alert?: Yes Use of a cane, walker or w/c?: No Grab bars in the bathroom?:  Yes Shower chair or bench in shower?: No Elevated toilet seat or a handicapped toilet?: No  TIMED UP AND GO:  Was the test performed?  Yes  Length of time to ambulate 10 feet: 15 sec Gait slow and steady without use of assistive device  Cognitive Function: Normal: Normal cognitive status assessed by direct observation by this Clinical Health Advisor. No abnormalities found. Patient is able to answer questions in an accurate and timely manner.     08/12/2017    9:44 AM  MMSE - Mini Mental State Exam  Orientation to time 5  Orientation to Place 5  Registration 3  Attention/ Calculation 4  Recall 2  Language- name 2 objects 2  Language- repeat 1  Language- follow 3 step command 3  Language- read & follow direction 1  Write a sentence 1  Copy design 1  Total score 28        04/09/2022    1:44 PM  6CIT Screen  What Year? 0 points  What month? 0 points  What time? 0 points  Count back from 20 0 points  Months in reverse 2 points  Repeat phrase 2 points  Total Score 4 points    Immunizations Immunization History  Administered Date(s) Administered   PFIZER(Purple Top)SARS-COV-2 Vaccination 07/04/2019, 07/27/2019, 03/14/2020    Screening Tests Health Maintenance  Topic Date Due   Pneumonia Vaccine 46+ Years old (1 of 2 - PCV) Never done   DTaP/Tdap/Td (1 - Tdap) Never done   Zoster Vaccines- Shingrix (1 of 2) Never done   OPHTHALMOLOGY EXAM  11/11/2021   INFLUENZA VACCINE  12/13/2022   COVID-19 Vaccine (4 - 2024-25 season) 01/13/2023   FOOT EXAM  04/10/2023   HEMOGLOBIN A1C  05/01/2023   Diabetic kidney evaluation - eGFR measurement  10/30/2023   Diabetic kidney evaluation - Urine ACR  10/30/2023   Medicare Annual Wellness (AWV)  08/01/2024   Colonoscopy  12/04/2024   DEXA SCAN  Completed   HPV VACCINES  Aged Out    Health Maintenance  Health Maintenance Due  Topic Date Due   Pneumonia Vaccine 59+ Years old (1 of 2 - PCV) Never done   DTaP/Tdap/Td (1 - Tdap) Never done   Zoster Vaccines- Shingrix (1 of 2) Never done   OPHTHALMOLOGY EXAM  11/11/2021   INFLUENZA VACCINE  12/13/2022   COVID-19 Vaccine (4 - 2024-25 season) 01/13/2023   FOOT EXAM  04/10/2023   HEMOGLOBIN A1C  05/01/2023   Health Maintenance Items Addressed: See Nurse Notes  Additional Screening:  Vision Screening: Recommended annual ophthalmology exams for early detection of glaucoma and other disorders of the eye.  Dental  Screening: Recommended annual dental exams for proper oral hygiene  Community Resource Referral / Chronic Care Management: CRR required this visit?  No   CCM required this visit?  No     Plan:     I have personally reviewed and noted the following in the patient's chart:   Medical and social history Use of alcohol, tobacco or illicit drugs  Current medications and supplements including opioid prescriptions. Patient is not currently taking opioid prescriptions. Functional ability and status Nutritional status Physical activity Advanced directives List of other physicians Hospitalizations, surgeries, and ER visits in previous 12 months Vitals Screenings to include cognitive, depression, and falls Referrals and appointments  In addition, I have reviewed and discussed with patient certain preventive protocols, quality metrics, and best practice recommendations. A written personalized care plan for preventive services as well  as general preventive health recommendations were provided to patient.     Denzell Colasanti L Jamilex Bohnsack, CMA   08/02/2023   After Visit Summary: (In Person-Printed) AVS printed and given to the patient  Notes: Please refer to Routing Comments.

## 2023-08-20 ENCOUNTER — Encounter: Payer: Self-pay | Admitting: Internal Medicine

## 2023-08-20 ENCOUNTER — Ambulatory Visit (INDEPENDENT_AMBULATORY_CARE_PROVIDER_SITE_OTHER): Admitting: Internal Medicine

## 2023-08-20 VITALS — BP 124/80 | HR 44 | Temp 97.9°F | Ht 64.25 in | Wt 140.0 lb

## 2023-08-20 DIAGNOSIS — E118 Type 2 diabetes mellitus with unspecified complications: Secondary | ICD-10-CM | POA: Diagnosis not present

## 2023-08-20 DIAGNOSIS — K219 Gastro-esophageal reflux disease without esophagitis: Secondary | ICD-10-CM

## 2023-08-20 DIAGNOSIS — H9193 Unspecified hearing loss, bilateral: Secondary | ICD-10-CM

## 2023-08-20 DIAGNOSIS — E1169 Type 2 diabetes mellitus with other specified complication: Secondary | ICD-10-CM

## 2023-08-20 DIAGNOSIS — E785 Hyperlipidemia, unspecified: Secondary | ICD-10-CM

## 2023-08-20 DIAGNOSIS — I10 Essential (primary) hypertension: Secondary | ICD-10-CM | POA: Diagnosis not present

## 2023-08-20 DIAGNOSIS — K5909 Other constipation: Secondary | ICD-10-CM

## 2023-08-20 DIAGNOSIS — Z0000 Encounter for general adult medical examination without abnormal findings: Secondary | ICD-10-CM

## 2023-08-20 LAB — COMPREHENSIVE METABOLIC PANEL WITH GFR
ALT: 10 U/L (ref 0–35)
AST: 18 U/L (ref 0–37)
Albumin: 4.7 g/dL (ref 3.5–5.2)
Alkaline Phosphatase: 63 U/L (ref 39–117)
BUN: 16 mg/dL (ref 6–23)
CO2: 29 meq/L (ref 19–32)
Calcium: 9.7 mg/dL (ref 8.4–10.5)
Chloride: 103 meq/L (ref 96–112)
Creatinine, Ser: 0.86 mg/dL (ref 0.40–1.20)
GFR: 63.5 mL/min (ref >60.00)
Glucose, Bld: 114 mg/dL — ABNORMAL HIGH (ref 70–99)
Potassium: 4.3 meq/L (ref 3.5–5.1)
Sodium: 140 meq/L (ref 135–145)
Total Bilirubin: 0.9 mg/dL (ref 0.2–1.2)
Total Protein: 7.7 g/dL (ref 6.0–8.3)

## 2023-08-20 LAB — CBC
HCT: 41.2 % (ref 36.0–46.0)
Hemoglobin: 13.4 g/dL (ref 12.0–15.0)
MCHC: 32.6 g/dL (ref 30.0–36.0)
MCV: 93.1 fl (ref 78.0–100.0)
Platelets: 236 10*3/uL (ref 150.0–400.0)
RBC: 4.42 Mil/uL (ref 3.87–5.11)
RDW: 14.3 % (ref 11.5–15.5)
WBC: 3.7 10*3/uL — ABNORMAL LOW (ref 4.0–10.5)

## 2023-08-20 LAB — LIPID PANEL
Cholesterol: 171 mg/dL (ref 0–200)
HDL: 60.3 mg/dL (ref >39.00)
LDL Cholesterol: 97 mg/dL (ref 0–99)
NonHDL: 110.84
Total CHOL/HDL Ratio: 3
Triglycerides: 70 mg/dL (ref 0.0–149.0)
VLDL: 14 mg/dL (ref 0.0–40.0)

## 2023-08-20 LAB — HEMOGLOBIN A1C: Hgb A1c MFr Bld: 7.1 % — ABNORMAL HIGH (ref 4.6–6.5)

## 2023-08-20 LAB — MICROALBUMIN / CREATININE URINE RATIO
Creatinine,U: 24.1 mg/dL
Microalb Creat Ratio: UNDETERMINED mg/g (ref 0.0–30.0)
Microalb, Ur: <0.7 mg/dL

## 2023-08-20 NOTE — Assessment & Plan Note (Signed)
 Flu shot counseled yearly. Pneumonia declines. Shingrix declines. Tetanus declines. Colonoscopy aged out. Mammogram aged out, pap smear aged out and dexa complete. Counseled about sun safety and mole surveillance. Counseled about the dangers of distracted driving. Given 10 year screening recommendations.

## 2023-08-20 NOTE — Assessment & Plan Note (Signed)
 This is still active and causes some atypical chest pain and upper abdomen pain from time to time. She is taking dexilant 60 mg daily and overall controlled. Continue.

## 2023-08-20 NOTE — Assessment & Plan Note (Signed)
BP at goal on amlodipine 5 mg daily and checking CMP and adjust as needed.

## 2023-08-20 NOTE — Assessment & Plan Note (Signed)
 Checking lipid panel and adjust pravastatin 20 mg daily as needed.

## 2023-08-20 NOTE — Assessment & Plan Note (Signed)
 Using hearing aids successfully.

## 2023-08-20 NOTE — Assessment & Plan Note (Addendum)
 Checking microalbumin to creatinine ratio and HgA1c and CMP and lipid panel. Diet controlled and on statin. Adjust as needed. Foot exam done. Likely eye exam up to date sees optho regularly.

## 2023-08-20 NOTE — Assessment & Plan Note (Signed)
 Taking daily otc and well controlled. Advised to continue.

## 2023-08-20 NOTE — Progress Notes (Signed)
   Subjective:   Patient ID: Monica [name], female    DOB: Mar 30, 1941, 83 y.o.   MRN: [id number]  HPI The patient is here for physical. Diabetic foot exam was performed with the following findings:   No deformities, ulcerations, or other skin breakdown Normal sensation of 10g monofilament Intact posterior tibialis and dorsalis pedis pulses     PMH, FMH, social history reviewed and updated  Review of Systems  Constitutional: Negative.   HENT: Negative.    Eyes: Negative.   Respiratory:  Negative for cough, chest tightness and shortness of breath.   Cardiovascular:  Positive for chest pain. Negative for palpitations and leg swelling.       Stable  Gastrointestinal:  Negative for abdominal distention, abdominal pain, constipation, diarrhea, nausea and vomiting.  Musculoskeletal:  Positive for arthralgias.  Skin: Negative.   Neurological: Negative.   Psychiatric/Behavioral: Negative.      Objective:  Physical Exam Constitutional:      Appearance: She is well-developed.  HENT:     Head: Normocephalic and atraumatic.  Cardiovascular:     Rate and Rhythm: Normal rate and regular rhythm.  Pulmonary:     Effort: Pulmonary effort is normal. No respiratory distress.     Breath sounds: Normal breath sounds. No wheezing or rales.  Abdominal:     General: Bowel sounds are normal. There is no distension.     Palpations: Abdomen is soft.     Tenderness: There is no abdominal tenderness. There is no rebound.  Musculoskeletal:     Cervical back: Normal range of motion.  Skin:    General: Skin is warm and dry.     Comments: Foot exam done  Neurological:     Mental Status: She is alert and oriented to person, place, and time.     Coordination: Coordination normal.     Vitals:   08/20/23 0918  BP: 124/80  Pulse: (!) 44  Temp: 97.9 F (36.6 C)  TempSrc: Oral  SpO2: 95%  Weight: 140 lb (63.5 kg)  Height: 5' 4.25" (1.632 m)    Assessment & Plan:

## 2023-08-26 ENCOUNTER — Ambulatory Visit: Payer: Self-pay | Admitting: Internal Medicine

## 2023-08-26 NOTE — Telephone Encounter (Signed)
  Disposition: [x]  Follow-up with PCP  Additional Notes: Pt called in requesting A1c results. This RN read the result, 7.1, per lab results.     Copied from CRM [id number]. Topic: Clinical - Lab/Test Results >> Aug 26, 2023  2:11 PM Aisha D wrote: Reason for CRM: Patient is calling to get the results for her A1c test. Reason for Disposition  Health Information question, no triage required and triager able to answer question  Answer Assessment - Initial Assessment Questions 1. REASON FOR CALL or QUESTION: "What is your reason for calling today?" or "How can I best help you?" or "What question do you have that I can help answer?"    Request for A1c  Protocols used: Information Only Call - No Triage-A-AH

## 2023-09-04 ENCOUNTER — Other Ambulatory Visit: Payer: Self-pay | Admitting: Internal Medicine

## 2023-09-17 ENCOUNTER — Ambulatory Visit
Admission: RE | Admit: 2023-09-17 | Discharge: 2023-09-17 | Disposition: A | Discharge status: Home / Self Care | Source: Ambulatory Visit | Attending: Family Medicine | Admitting: Family Medicine

## 2023-09-17 DIAGNOSIS — M8588 Other specified disorders of bone density and structure, other site: Secondary | ICD-10-CM | POA: Diagnosis not present

## 2023-09-17 DIAGNOSIS — Z780 Asymptomatic menopausal state: Secondary | ICD-10-CM

## 2023-09-30 ENCOUNTER — Ambulatory Visit: Payer: Self-pay

## 2023-09-30 NOTE — Progress Notes (Signed)
This has been printed and mailed to the patient

## 2023-10-22 ENCOUNTER — Other Ambulatory Visit: Payer: Self-pay | Admitting: Internal Medicine

## 2023-10-23 ENCOUNTER — Ambulatory Visit: Admitting: Gastroenterology

## 2023-10-23 ENCOUNTER — Encounter: Payer: Self-pay | Admitting: Gastroenterology

## 2023-10-23 VITALS — BP 126/74 | HR 70 | Ht 63.75 in | Wt 141.5 lb

## 2023-10-23 DIAGNOSIS — K219 Gastro-esophageal reflux disease without esophagitis: Secondary | ICD-10-CM | POA: Diagnosis not present

## 2023-10-23 DIAGNOSIS — Z79899 Other long term (current) drug therapy: Secondary | ICD-10-CM

## 2023-10-23 DIAGNOSIS — K5909 Other constipation: Secondary | ICD-10-CM

## 2023-10-23 DIAGNOSIS — K642 Third degree hemorrhoids: Secondary | ICD-10-CM

## 2023-10-23 NOTE — Progress Notes (Signed)
 HPI :  83 year old female here for follow-up visit for constipation, hemorrhoids, GERD.  She has had longstanding reflux, history of EGD showing no evidence of Barrett's.  She has been on a variety of regimens in the past and has found that Dexilant  has worked better for her than other regimens.  She has been on Dexilant  60 mg daily since have last seen her.  We tried getting her to a lower dose at 30 mg/day at the last visit and she states it did not work well, she had frequent breakthrough and did not like it.  Increase back to 60 mg daily.  If she takes it every day she denies any breakthrough, tolerates it well.  She denies any dysphagia, no nausea or vomiting.  1 we discussed long-term risks, she has normal renal function.  Unfortunately she had a DEXA scan recently which showed osteoporosis.  She denies any fracture history.  She is going to discuss this further with Dr. Nicolette Barrio at her next visit.  She is not taking anything specifically for osteoporosis.  She does have chronic constipation.  On MiraLAX  which she takes once to twice daily.  If she takes this as well as over-the-counter fiber Gummies her bowels generally move okay.  She does have some symptomatic hemorrhoids at times, grade 3 prolapse and irritation periodically.  She had a remote hemorrhoidectomy.  Since her last visit with me she had a colonoscopy in July 2023.  The exam was normal without any polyps, no further colonoscopy was recommended given her age.  Her sister did have colon cancer around age 62.     Prior work-up Colonoscopy 09/21/2013 - 3mm transverse TA, hemorrhoids Esophageal manometry 02/09/2013 - normal  EGD 01/21/2013 - normal exam, empiric dilation performed Colonoscopy 05/18/2009 - 5mm TVA   Colonoscopy 10/29/18 - The perianal and digital rectal examinations were normal. - A 3 mm polyp was found in the cecum. The polyp was sessile. The polyp was removed with a cold snare. Resection and retrieval were complete. -  A 2 mm polyp was found in the transverse colon. The polyp was sessile. The polyp was removed with a cold snare. Resection and retrieval were complete. - A 2 mm polyp was found in the descending colon. The polyp was sessile. The polyp was removed with a cold snare. Resection and retrieval were complete. - A 4 to 5 mm polyp was found in the sigmoid colon. The polyp was sessile. The polyp was removed with a cold snare. Resection and retrieval were complete. - Internal hemorrhoids were found during retroflexion, with suspected stigmata of prior banding noted. - The exam was otherwise without abnormality.   Path shows TAs    Colonoscopy 12/04/21: - The perianal and digital rectal examinations were normal. - A few small-mouthed diverticula were found in the distal transverse colon and entire colon. - The colon was tortuous. - Internal hemorrhoids were found during retroflexion. - The exam was otherwise without abnormality. No polyps. Of note, first attempt using colonoscope was aborted due to equipment failure of the colonscope (one of the wheels failed to turn mid cecal intubation), this was removed and replaced with another pediatric colonoscope. Times reflect the time taken of the second colonscope.  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   DEPRESSION, SITUATIONAL    GERD (gastroesophageal reflux disease)  w/ HH (EGD 2010)   GLAUCOMA    Hypertension    Iron deficiency anemia due to chronic blood loss 02/05/2014   LOW BACK PAIN, CHRONIC    Mixed hyperlipidemia    OSTEOPENIA    Routine general medical examination at a health care facility 06/18/2016   Stricture and stenosis of esophagus 05/14/2004     Past Surgical History:  Procedure Laterality Date   BACK SURGERY     COLONOSCOPY  09/21/2013   [facility]   ESOPHAGEAL MANOMETRY N/A 02/09/2013   Procedure:  ESOPHAGEAL MANOMETRY (EM);  Surgeon: Tessa Figures, MD;  Location: [facility];  Service: Endoscopy;  Laterality: N/A;   EYE SURGERY     frozen shoulder surgery Left    HEMORROIDECTOMY     HYSTEROSCOPY WITH D & C  07/20/2011   Procedure: DILATATION AND CURETTAGE /HYSTEROSCOPY;  Surgeon: Romilda Coaster, MD;  Location: [facility];  Service: Gynecology;  Laterality: N/A;   POLYPECTOMY  07/20/2011   Procedure: POLYPECTOMY;  Surgeon: Romilda Coaster, MD;  Location: [facility];  Service: Gynecology;  Laterality: N/A;   POLYPECTOMY     ROTATOR CUFF REPAIR     right    TUBAL LIGATION     Family History  Problem Relation Age of Onset   Diabetes Mother    Heart failure Father    Breast cancer Sister    Colon cancer Sister 91   Diabetes Brother    Colon cancer Son    Lung cancer Son    Liver cancer Son    Esophageal cancer Neg Hx    Rectal cancer Neg Hx    Stomach cancer Neg Hx    Colon polyps Neg Hx    Social History   Tobacco Use   Smoking status: Never   Smokeless tobacco: Never  Vaping Use   Vaping status: Never Used  Substance Use Topics   Alcohol use: No    Alcohol/week: 0.0 standard drinks of alcohol   Drug use: No   Current Outpatient Medications  Medication Sig Dispense Refill   amLODipine  (NORVASC ) 5 MG tablet Take 1 tablet (5 mg total) by mouth daily. 90 tablet 3   aspirin 81 MG tablet Take 81 mg by mouth daily.     dexlansoprazole  (DEXILANT ) 60 MG capsule Take 1 capsule by mouth once daily 90 capsule 0   loteprednol (LOTEMAX) 0.5 % ophthalmic suspension Place 1 drop into both eyes 4 (four) times daily.     meloxicam  (MOBIC ) 7.5 MG tablet Take 1 tablet by mouth once daily 90 tablet 0   polyethylene glycol (MIRALAX  / GLYCOLAX ) 17 g packet Take 17 g by mouth daily.     pravastatin  (PRAVACHOL ) 20 MG tablet Take 1 tablet by mouth once daily 90 tablet 0   RESTASIS 0.05 % ophthalmic emulsion Place 1 drop into both eyes 2 (two) times daily. Twice a day     No current  facility-administered medications for this visit.   Allergies  Allergen Reactions   Codeine     REACTION: Upset stomach   Hydrochlorothiazide     REACTION: Nausea and headache     Review of Systems: All systems reviewed and negative except where noted in HPI.    Lab Results  Component Value Date   WBC 3.7 (L) 08/20/2023   HGB 13.4 08/20/2023   HCT 41.2 08/20/2023   MCV 93.1 08/20/2023   PLT 236.0 08/20/2023    Lab Results  Component Value Date   NA 140  08/20/2023   CL 103 08/20/2023   K 4.3 08/20/2023   CO2 29 08/20/2023   BUN 16 08/20/2023   CREATININE 0.86 08/20/2023   GFR 63.50 08/20/2023   CALCIUM  9.7 08/20/2023   ALBUMIN 4.7 08/20/2023   GLUCOSE 114 (H) 08/20/2023    Lab Results  Component Value Date   ALT 10 08/20/2023   AST 18 08/20/2023   ALKPHOS 63 08/20/2023   BILITOT 0.9 08/20/2023     Physical Exam: BP 126/74   Pulse 70   Ht 5' 3.75 (1.619 m)   Wt 141 lb 8 oz (64.2 kg)   SpO2 96%   BMI 24.48 kg/m  Constitutional: Pleasant,well-developed, female in no acute distress. Skin: Skin is warm and dry. No rashes noted. Psychiatric: Normal mood and affect. Behavior is normal.   ASSESSMENT: 83 y.o. female here for assessment of the following  1. Gastroesophageal reflux disease, unspecified whether esophagitis present   2. Long-term current use of proton pump inhibitor therapy   3. Chronic constipation   4. Grade III hemorrhoids    Overall symptoms are very well-controlled on Dexilant  60 mg daily.  We tried to reduce her dose to 30 mg daily and did not go well, she had breakthrough.  She has failed some other PPIs in the past.  We discussed long-term risks and benefits of chronic PPI use, long-term use the lowest dose needed to control symptoms.  Unfortunately it appears only the higher dose of Dexilant  has worked to control her symptoms.  She is very happy with it in regards to symptom control.  She has developed osteoporosis since I have seen  her.  We discussed that Dexilant  and use/chronic PPIs can increase the risk for bone fractures.  I recommend she see Dr. Nicolette Barrio to discuss management of osteoporosis and if she warrants treatment for that in light of her chronic PPI use.  She understands and agrees.  No Barrett's on prior EGDs.  Constipation seems pretty well-controlled on combination of MiraLAX  and fiber Gummies.  She can increase MiraLAX  to twice daily dosing every day if needed.  We discussed her other options.  If she still has symptoms despite this then would consider starting Linzess  or Amitiza  etc.  She wishes to continue her regimen for now.  She has occasional symptomatic hemorrhoids.  Will give her some Calmol 4 suppositories to use as needed.  If she still has symptoms despite this and warrants more aggressive therapy we could consider hemorrhoid banding.  She has had a history of remote hemorrhoidectomy and want to avoid that if possible.  Otherwise no further colonoscopies planned given her age and results of her last exam without any polyps.  She agrees.   PLAN: - continue Dexilant  - discussed long term risks / benefits of chronic PPI use. - she does have osteoporosis, increased risk for fracture on PPI, f/u with PCP for management especially in light of continued PPI use - continue Miralax  - can take BID if needed, as well as fiber gummies - add Calmol4 PRN for hemorrhoids - consider hemorrhoid banding pending her course (had surgery remotely) - f/u one year or sooner with issues  Christi Coward, MD [facility]

## 2023-10-23 NOTE — Patient Instructions (Addendum)
 Continue Dexilant   Continue Miralax  - take twice a day as needed  Continue fiber gummies  We have given you samples of the following medication to take: Calmol 4 suppositories - use as directed  Please speak with your PCP regarding osteoporosis  Please follow up in 1 year or sooner as needed.  Thank you for entrusting me with your care and for choosing [facility], Dr. Alvester Johnson  If your blood pressure at your visit was 140/90 or greater, please contact your primary care physician to follow up on this. ______________________________________________________  If you are age 51 or older, your body mass index should be between 23-30. Your Body mass index is 24.48 kg/m. If this is out of the aforementioned range listed, please consider follow up with your Primary Care Provider.  If you are age 54 or younger, your body mass index should be between 19-25. Your Body mass index is 24.48 kg/m. If this is out of the aformentioned range listed, please consider follow up with your Primary Care Provider.  ________________________________________________________  The [facility] providers would like to encourage you to use MYCHART to communicate with providers for non-urgent requests or questions.  Due to long hold times on the telephone, sending your provider a message by [facility] may be a faster and more efficient way to get a response.  Please allow 48 business hours for a response.  Please remember that this is for non-urgent requests.  _______________________________________________________  Due to recent changes in healthcare laws, you may see the results of your imaging and laboratory studies on MyChart before your provider has had a chance to review them.  We understand that in some cases there may be results that are confusing or concerning to you. Not all laboratory results come back in the same time frame and the provider may be waiting for multiple results in order to interpret  others.  Please give us  48 hours in order for your provider to thoroughly review all the results before contacting the office for clarification of your results.

## 2023-11-01 DIAGNOSIS — H43393 Other vitreous opacities, bilateral: Secondary | ICD-10-CM | POA: Diagnosis not present

## 2023-11-25 ENCOUNTER — Ambulatory Visit (INDEPENDENT_AMBULATORY_CARE_PROVIDER_SITE_OTHER)

## 2023-11-25 ENCOUNTER — Encounter: Payer: Self-pay | Admitting: Family

## 2023-11-25 ENCOUNTER — Ambulatory Visit: Payer: Self-pay | Admitting: Family

## 2023-11-25 ENCOUNTER — Ambulatory Visit (INDEPENDENT_AMBULATORY_CARE_PROVIDER_SITE_OTHER): Admitting: Family

## 2023-11-25 VITALS — BP 126/80 | HR 80 | Temp 98.4°F | Ht 63.75 in | Wt 143.0 lb

## 2023-11-25 DIAGNOSIS — R109 Unspecified abdominal pain: Secondary | ICD-10-CM | POA: Diagnosis not present

## 2023-11-25 DIAGNOSIS — K5909 Other constipation: Secondary | ICD-10-CM | POA: Diagnosis not present

## 2023-11-25 DIAGNOSIS — Z634 Disappearance and death of family member: Secondary | ICD-10-CM | POA: Diagnosis not present

## 2023-11-25 DIAGNOSIS — R630 Anorexia: Secondary | ICD-10-CM | POA: Diagnosis not present

## 2023-11-25 DIAGNOSIS — F4321 Adjustment disorder with depressed mood: Secondary | ICD-10-CM

## 2023-11-25 NOTE — Patient Instructions (Addendum)
 It was a pleasure to meet you today. Consider seeing a grief counselor.  Increase intake or supplement with protein shakes.

## 2023-11-25 NOTE — Progress Notes (Signed)
 Acute Office Visit  Subjective:     Patient ID: Monica [name], female    DOB: Oct 12, 1940, 83 y.o.   MRN: [id number]  Chief Complaint  Patient presents with  . Depression    Loss of son and patient not eating; Sleeping alot    HPI Patient is in today with c/o constipation despite taking Miralax  daily. She had a colonoscopy in 2023 that was normal. She has a history of chronic constipation. She reports her appetite   She reports losing her oldest son June 7th to cancer and she has been having a difficult time coping. She feels encouraged about the future. She denies any feelings of helplessness, hopelessness, no thoughts of death or dying.   Review of Systems  Constitutional:  Negative for weight loss.       Decreased appetite  Respiratory: Negative.    Cardiovascular: Negative.   Gastrointestinal:  Positive for constipation. Negative for blood in stool, melena, nausea and vomiting.  Genitourinary: Negative.   Musculoskeletal: Negative.   Neurological: Negative.   Psychiatric/Behavioral: Negative.    All other systems reviewed and are negative.   Past Medical History:  Diagnosis Date  . ANXIETY   . Arthritis   . Chronic constipation   . 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 DEPRESSION, SITUATIONAL   . GERD (gastroesophageal reflux disease)    w/ HH (EGD 2010)  . GLAUCOMA   . Hypertension   . Iron deficiency anemia due to chronic blood loss 02/05/2014  . LOW BACK PAIN, CHRONIC   . Mixed hyperlipidemia   . OSTEOPENIA   . Routine general medical examination at a health care facility 06/18/2016  . Stricture and stenosis of esophagus 05/14/2004    Social History   Socioeconomic History  . Marital status: Divorced    Spouse name: Not on file  . Number of children: 5  . Years of education: 12th  . Highest education level: Not on file  Occupational History  . Occupation: retired  Tobacco Use  . Smoking status: Never  . Smokeless tobacco: Never   Vaping Use  . Vaping status: Never Used  Substance and Sexual Activity  . Alcohol use: No    Alcohol/week: 0.0 standard drinks of alcohol  . Drug use: No  . Sexual activity: Never  Other Topics Concern  . Not on file  Social History Narrative   Lives alone.   Social Drivers of Health   Financial Resource Strain: Medium Risk (08/02/2023)   Overall Financial Resource Strain (CARDIA)   . Difficulty of Paying Living Expenses: Somewhat hard  Food Insecurity: No Food Insecurity (08/02/2023)   Hunger Vital Sign   . Worried About Programme researcher, broadcasting/film/video in the Last Year: Never true   . Ran Out of Food in the Last Year: Never true  Transportation Needs: No Transportation Needs (08/02/2023)   PRAPARE - Transportation   . Lack of Transportation (Medical): No   . Lack of Transportation (Non-Medical): No  Physical Activity: Inactive (08/02/2023)   Exercise Vital Sign   . Days of Exercise per Week: 0 days   . Minutes of Exercise per Session: 0 min  Stress: No Stress Concern Present (08/02/2023)   [employer] of Occupational Health - Occupational Stress Questionnaire   . Feeling of Stress : Only a little  Social Connections: Moderately Integrated (08/02/2023)   Social Connection and Isolation Panel   . Frequency of Communication with Friends and Family: More than three times a week   .  Frequency of Social Gatherings with Friends and Family: More than three times a week   . Attends Religious Services: More than 4 times per year   . Active Member of Clubs or Organizations: Yes   . Attends Banker Meetings: Never   . Marital Status: Divorced  Intimate Partner Violence: Patient Unable To Answer (08/02/2023)   Humiliation, Afraid, Rape, and Kick questionnaire   . Fear of Current or Ex-Partner: Patient unable to answer   . Emotionally Abused: Patient unable to answer   . Physically Abused: Patient unable to answer   . Sexually Abused: Patient unable to answer    Past Surgical  History:  Procedure Laterality Date  . BACK SURGERY    . COLONOSCOPY  09/21/2013   [facility]  . ESOPHAGEAL MANOMETRY N/A 02/09/2013   Procedure: ESOPHAGEAL MANOMETRY (EM);  Surgeon: Alm JONELLE Gander, MD;  Location: [facility];  Service: Endoscopy;  Laterality: N/A;  . EYE SURGERY    . frozen shoulder surgery Left   . HEMORROIDECTOMY    . HYSTEROSCOPY WITH D & C  07/20/2011   Procedure: DILATATION AND CURETTAGE /HYSTEROSCOPY;  Surgeon: Debby JULIANNA Lares, MD;  Location: [facility];  Service: Gynecology;  Laterality: N/A;  . POLYPECTOMY  07/20/2011   Procedure: POLYPECTOMY;  Surgeon: Debby JULIANNA Lares, MD;  Location: [facility];  Service: Gynecology;  Laterality: N/A;  . POLYPECTOMY    . ROTATOR CUFF REPAIR     right   . TUBAL LIGATION      Family History  Problem Relation Age of Onset  . Diabetes Mother   . Heart failure Father   . Breast cancer Sister   . Colon cancer Sister 34  . Diabetes Brother   . Colon cancer Son   . Lung cancer Son   . Liver cancer Son   . Esophageal cancer Neg Hx   . Rectal cancer Neg Hx   . Stomach cancer Neg Hx   . Colon polyps Neg Hx     Allergies  Allergen Reactions  . Codeine     REACTION: Upset stomach  . Hydrochlorothiazide     REACTION: Nausea and headache    Current Outpatient Medications on File Prior to Visit  Medication Sig Dispense Refill  . amLODipine  (NORVASC ) 5 MG tablet Take 1 tablet (5 mg total) by mouth daily. 90 tablet 3  . aspirin 81 MG tablet Take 81 mg by mouth daily.    . dexlansoprazole  (DEXILANT ) 60 MG capsule Take 1 capsule by mouth once daily 90 capsule 0  . loteprednol (LOTEMAX) 0.5 % ophthalmic suspension Place 1 drop into both eyes 4 (four) times daily.    . meloxicam  (MOBIC ) 7.5 MG tablet Take 1 tablet by mouth once daily 90 tablet 0  . polyethylene glycol (MIRALAX  / GLYCOLAX ) 17 g packet Take 17 g by mouth 2 (two) times daily as needed.    . pravastatin  (PRAVACHOL ) 20 MG tablet Take 1 tablet by mouth once daily 90 tablet 0  .  RESTASIS 0.05 % ophthalmic emulsion Place 1 drop into both eyes 2 (two) times daily. Twice a day     No current facility-administered medications on file prior to visit.    BP 126/80 (BP Location: Left Arm, Patient Position: Sitting, Cuff Size: Normal)   Pulse 80   Temp 98.4 F (36.9 C) (Oral)   Ht 5' 3.75 (1.619 m)   Wt 143 lb (64.9 kg)   SpO2 97%   BMI 24.74 kg/m chart  Objective:    BP 126/80 (BP Location: Left Arm, Patient Position: Sitting, Cuff Size: Normal)   Pulse 80   Temp 98.4 F (36.9 C) (Oral)   Ht 5' 3.75 (1.619 m)   Wt 143 lb (64.9 kg)   SpO2 97%   BMI 24.74 kg/m    Physical Exam Vitals reviewed.  Constitutional:      Appearance: Normal appearance. She is normal weight.  Cardiovascular:     Pulses: Normal pulses.     Heart sounds: Normal heart sounds.  Pulmonary:     Effort: Pulmonary effort is normal.     Breath sounds: Normal breath sounds.  Abdominal:     General: Abdomen is flat. Bowel sounds are normal.     Palpations: Abdomen is soft.  Musculoskeletal:        General: Normal range of motion.     Cervical back: Normal range of motion and neck supple.  Skin:    General: Skin is warm and dry.  Neurological:     General: No focal deficit present.     Mental Status: She is alert and oriented to person, place, and time. Mental status is at baseline.  Psychiatric:        Mood and Affect: Mood normal.        Behavior: Behavior normal.        Thought Content: Thought content normal.        Judgment: Judgment normal.    No results found for any visits on 11/25/23.      Assessment & Plan:   Problem List Items Addressed This Visit     Chronic constipation   Relevant Orders   DG Abd 1 View   Other Visit Diagnoses       Grief at loss of child    -  Primary     Decreased appetite          Call the office if symptoms worsen or persist. Increase PO intake. Consider protein shakes. Encouraged grief counseling.  No orders of the  defined types were placed in this encounter.   No follow-ups on file.  Aniyiah Zell B Jarmal Lewelling, FNP

## 2023-11-29 NOTE — Progress Notes (Signed)
 Called patient and went over results and patient will hold off on referral for now

## 2023-12-11 ENCOUNTER — Encounter: Payer: Self-pay | Admitting: Emergency Medicine

## 2023-12-11 ENCOUNTER — Ambulatory Visit (INDEPENDENT_AMBULATORY_CARE_PROVIDER_SITE_OTHER): Admitting: Emergency Medicine

## 2023-12-11 VITALS — BP 118/60 | HR 75 | Temp 98.1°F | Ht 63.7 in | Wt 142.1 lb

## 2023-12-11 DIAGNOSIS — F4321 Adjustment disorder with depressed mood: Secondary | ICD-10-CM

## 2023-12-11 DIAGNOSIS — R110 Nausea: Secondary | ICD-10-CM | POA: Insufficient documentation

## 2023-12-11 DIAGNOSIS — R5383 Other fatigue: Secondary | ICD-10-CM | POA: Insufficient documentation

## 2023-12-11 LAB — CBC WITH DIFFERENTIAL/PLATELET
Basophils Absolute: 0 K/uL (ref 0.0–0.1)
Basophils Relative: 0.6 % (ref 0.0–3.0)
Eosinophils Absolute: 0 K/uL (ref 0.0–0.7)
Eosinophils Relative: 0.2 % (ref 0.0–5.0)
HCT: 39.7 % (ref 36.0–46.0)
Hemoglobin: 12.9 g/dL (ref 12.0–15.0)
Lymphocytes Relative: 56.8 % — ABNORMAL HIGH (ref 12.0–46.0)
Lymphs Abs: 2.2 K/uL (ref 0.7–4.0)
MCHC: 32.5 g/dL (ref 30.0–36.0)
MCV: 91.6 fl (ref 78.0–100.0)
Monocytes Absolute: 0.3 K/uL (ref 0.1–1.0)
Monocytes Relative: 7.1 % (ref 3.0–12.0)
Neutro Abs: 1.4 K/uL (ref 1.4–7.7)
Neutrophils Relative %: 35.3 % — ABNORMAL LOW (ref 43.0–77.0)
Platelets: 261 K/uL (ref 150.0–400.0)
RBC: 4.34 Mil/uL (ref 3.87–5.11)
RDW: 14.3 % (ref 11.5–15.5)
WBC: 3.9 K/uL — ABNORMAL LOW (ref 4.0–10.5)

## 2023-12-11 LAB — URINALYSIS, ROUTINE W REFLEX MICROSCOPIC
Bilirubin Urine: NEGATIVE
Ketones, ur: NEGATIVE
Nitrite: NEGATIVE
Specific Gravity, Urine: 1.02 (ref 1.000–1.030)
Total Protein, Urine: NEGATIVE
Urine Glucose: NEGATIVE
Urobilinogen, UA: 0.2 (ref 0.0–1.0)
pH: 6 (ref 5.0–8.0)

## 2023-12-11 LAB — COMPREHENSIVE METABOLIC PANEL WITH GFR
ALT: 13 U/L (ref 0–35)
AST: 22 U/L (ref 0–37)
Albumin: 4.5 g/dL (ref 3.5–5.2)
Alkaline Phosphatase: 57 U/L (ref 39–117)
BUN: 13 mg/dL (ref 6–23)
CO2: 29 meq/L (ref 19–32)
Calcium: 9.5 mg/dL (ref 8.4–10.5)
Chloride: 105 meq/L (ref 96–112)
Creatinine, Ser: 0.84 mg/dL (ref 0.40–1.20)
GFR: 65.18 mL/min (ref >60.00)
Glucose, Bld: 85 mg/dL (ref 70–99)
Potassium: 4.1 meq/L (ref 3.5–5.1)
Sodium: 141 meq/L (ref 135–145)
Total Bilirubin: 0.9 mg/dL (ref 0.2–1.2)
Total Protein: 7.5 g/dL (ref 6.0–8.3)

## 2023-12-11 LAB — TSH: TSH: 1.5 u[IU]/mL (ref 0.35–5.50)

## 2023-12-11 NOTE — Assessment & Plan Note (Signed)
 Clinically stable.  No red flag signs or symptoms. Differential diagnosis discussed. Recommend blood work today. Grieving process contributing to perhaps some depression Needs follow-up with PCP

## 2023-12-11 NOTE — Progress Notes (Signed)
 Monica [name] 83 y.o.   Chief Complaint  Patient presents with   Nausea    Fatigue and nausea for about 4 days, no vomiting. Happens in the morning and also when standing for long. Pt is not sure what causing it ( only change in her life is the loss of her son) pt stated all these symptoms started after that.    HISTORY OF PRESENT ILLNESS: Acute problem visit today.  Patient of Dr. Almarie Cleveland. This is a 83 y.o. female complaining of feeling nauseous and fatigued for the last 4 to 5 days Denies vomiting.  No new medications. Still grieving recent loss of her son who died of liver cancer. No other complaints or medical concerns today.  HPI   Prior to Admission medications   Medication Sig Start Date End Date Taking? Authorizing Provider  amLODipine  (NORVASC ) 5 MG tablet Take 1 tablet (5 mg total) by mouth daily. 12/17/22  Yes Cleveland Almarie LABOR, MD  aspirin 81 MG tablet Take 81 mg by mouth daily.   Yes [provider]  dexlansoprazole  (DEXILANT ) 60 MG capsule Take 1 capsule by mouth once daily 10/23/23  Yes Cleveland Almarie LABOR, MD  loteprednol (LOTEMAX) 0.5 % ophthalmic suspension Place 1 drop into both eyes 4 (four) times daily.   Yes [provider]  meloxicam  (MOBIC ) 7.5 MG tablet Take 1 tablet by mouth once daily 08/20/22  Yes Cleveland Almarie LABOR, MD  polyethylene glycol (MIRALAX  / GLYCOLAX ) 17 g packet Take 17 g by mouth 2 (two) times daily as needed.   Yes [provider]  pravastatin  (PRAVACHOL ) 20 MG tablet Take 1 tablet by mouth once daily 09/04/23  Yes Cleveland Almarie LABOR, MD  RESTASIS 0.05 % ophthalmic emulsion Place 1 drop into both eyes 2 (two) times daily. 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   Chronic constipation 02/27/2011   GERD (gastroesophageal reflux disease) 02/27/2011   Hyperlipidemia associated with type 2 diabetes mellitus (HCC) 01/14/2009   ESOPHAGEAL STRICTURE 11/11/2008   History of colon polyps 11/11/2008   Essential hypertension 12/01/2007   OSTEOPENIA 09/12/2007   Unspecified glaucoma 05/07/2007   Arthropathy 05/07/2007    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   DEPRESSION, SITUATIONAL    GERD (gastroesophageal reflux disease)    w/ HH (EGD 2010)   GLAUCOMA    Hypertension    Iron deficiency anemia due to chronic blood loss 02/05/2014   LOW BACK PAIN, CHRONIC    Mixed hyperlipidemia    OSTEOPENIA    Routine general medical examination at a health care facility 06/18/2016   Stricture and stenosis of esophagus 05/14/2004    Past Surgical History:  Procedure Laterality Date   BACK SURGERY     COLONOSCOPY  09/21/2013  [facility]   ESOPHAGEAL MANOMETRY N/A 02/09/2013   Procedure: ESOPHAGEAL MANOMETRY (EM);  Surgeon: Alm JONELLE Gander, MD;  Location: [facility];  Service: Endoscopy;  Laterality: N/A;   EYE SURGERY     frozen shoulder surgery Left    HEMORROIDECTOMY     HYSTEROSCOPY WITH D & C  07/20/2011   Procedure: DILATATION AND CURETTAGE /HYSTEROSCOPY;  Surgeon: Debby JULIANNA Lares, MD;  Location: [facility];  Service: Gynecology;  Laterality: N/A;   POLYPECTOMY  07/20/2011   Procedure: POLYPECTOMY;  Surgeon: Debby JULIANNA Lares, MD;  Location: [facility];  Service: Gynecology;  Laterality: N/A;   POLYPECTOMY     ROTATOR  CUFF REPAIR     right    TUBAL LIGATION      Social History   Socioeconomic History   Marital status: Divorced    Spouse name: Not on file   Number of children: 5   Years of education: 12th   Highest education level: Not on file  Occupational History   Occupation: retired  Tobacco Use   Smoking status: Never   Smokeless tobacco: Never  Vaping Use   Vaping status: Never Used  Substance and Sexual Activity   Alcohol use: No    Alcohol/week: 0.0 standard drinks of alcohol   Drug use: No   Sexual activity: Never  Other Topics Concern   Not on file  Social History Narrative   Lives alone.   Social Drivers of Health   Financial Resource Strain: Medium Risk (08/02/2023)   Overall Financial Resource Strain (CARDIA)    Difficulty of Paying Living Expenses: Somewhat hard  Food Insecurity: No Food Insecurity (08/02/2023)   Hunger Vital Sign    Worried About Running Out of Food in the Last Year: Never true    Ran Out of Food in the Last Year: Never true  Transportation Needs: No Transportation Needs (08/02/2023)   PRAPARE - Administrator, Civil Service (Medical): No    Lack of Transportation (Non-Medical): No  Physical Activity: Inactive (08/02/2023)   Exercise Vital Sign    Days of Exercise per Week: 0 days    Minutes of Exercise per Session: 0 min  Stress: No Stress Concern Present (08/02/2023)   [employer] of Occupational Health - Occupational Stress Questionnaire    Feeling of Stress : Only a little  Social Connections: Moderately Integrated (08/02/2023)   Social Connection and Isolation Panel    Frequency of Communication with Friends and Family: More than three times a week    Frequency of Social Gatherings with Friends and Family: More than three times a week    Attends Religious Services: More than 4 times per year    Active Member of [employer] or Organizations: Yes    Attends Banker Meetings: Never    Marital Status: Divorced  Careers information officer Violence: Patient Unable To Answer (08/02/2023)   Humiliation, Afraid, Rape, and Kick questionnaire    Fear of Current or Ex-Partner: Patient unable to answer    Emotionally Abused: Patient unable to answer    Physically Abused: Patient unable to answer    Sexually Abused: Patient unable to answer    Family History  Problem Relation Age of Onset   Diabetes Mother    Heart failure Father    Breast cancer Sister    Colon cancer Sister 82   Diabetes Brother    Colon cancer Son    Lung cancer Son    Liver cancer Son  Esophageal cancer Neg Hx    Rectal cancer Neg Hx    Stomach cancer Neg Hx    Colon polyps Neg Hx      Review of Systems  Constitutional:  Positive for malaise/fatigue. Negative for chills and fever.  HENT: Negative.  Negative for congestion and sore throat.   Respiratory: Negative.  Negative for cough and shortness of breath.   Cardiovascular: Negative.  Negative for chest pain and palpitations.  Gastrointestinal:  Positive for nausea.  Genitourinary:  Negative for dysuria and hematuria.  Neurological: Negative.  Negative for dizziness and headaches.  All other systems reviewed and are negative.   Vitals:   12/11/23 1309  BP: 118/60  Pulse: 75  Temp: 98.1 F (36.7 C)  SpO2: 97%    Physical Exam Vitals reviewed.  Constitutional:      Appearance: Normal appearance.  HENT:     Head: Normocephalic.     Mouth/Throat:     Mouth: Mucous membranes are moist.     Pharynx: Oropharynx is clear.  Eyes:     Extraocular Movements: Extraocular movements intact.     Pupils: Pupils are equal, round, and reactive to light.  Cardiovascular:     Rate and Rhythm: Normal rate and regular rhythm.     Pulses: Normal pulses.     Heart sounds: Normal heart sounds.  Pulmonary:     Effort: Pulmonary effort is normal.     Breath sounds: Normal breath sounds.  Abdominal:     Palpations: Abdomen is soft.     Tenderness: There is no abdominal tenderness.   Musculoskeletal:     Cervical back: No tenderness.     Right lower leg: No edema.     Left lower leg: No edema.  Lymphadenopathy:     Cervical: No cervical adenopathy.  Skin:    General: Skin is warm and dry.     Capillary Refill: Capillary refill takes less than 2 seconds.  Neurological:     General: No focal deficit present.     Mental Status: She is alert and oriented to person, place, and time.  Psychiatric:        Mood and Affect: Mood normal.        Behavior: Behavior normal.      ASSESSMENT & PLAN: A total of 33 minutes was spent with the patient and counseling/coordination of care regarding preparing for this visit, review of most recent office visit notes, review of all medications and chronic medical conditions, differential diagnosis of her symptoms, need for blood work today, prognosis, documentation and need for follow-up with PCP.  Problem List Items Addressed This Visit       Other   Grieving   Grieving recent loss of her son Contributing to her symptoms..      Fatigue - Primary   Clinically stable.  No red flag signs or symptoms. Differential diagnosis discussed. Recommend blood work today. Grieving process contributing to perhaps some depression Needs follow-up with PCP      Relevant Orders   CBC with Differential/Platelet   Comprehensive metabolic panel with GFR   TSH   Urinalysis   Urine Culture   Nausea without vomiting   Clinically stable.  No red flag signs or symptoms. Recommend blood work and urinalysis Benign physical examination and findings Recommend to stay well-hydrated follow-up with PCP next week      Relevant Orders   CBC with Differential/Platelet   Comprehensive metabolic panel with GFR   TSH   Urinalysis  Urine Culture   Patient Instructions  Health Maintenance After Age 3 After age 4, you are at a higher risk for certain long-term diseases and infections as well as injuries from falls. Falls are a major cause of  broken bones and head injuries in people who are older than age 74. Getting regular preventive care can help to keep you healthy and well. Preventive care includes getting regular testing and making lifestyle changes as recommended by your health care provider. Talk with your health care provider about: Which screenings and tests you should have. A screening is a test that checks for a disease when you have no symptoms. A diet and exercise plan that is right for you. What should I know about screenings and tests to prevent falls? Screening and testing are the best ways to find a health problem early. Early diagnosis and treatment give you the best chance of managing medical conditions that are common after age 38. Certain conditions and lifestyle choices may make you more likely to have a fall. Your health care provider may recommend: Regular vision checks. Poor vision and conditions such as cataracts can make you more likely to have a fall. If you wear glasses, make sure to get your prescription updated if your vision changes. Medicine review. Work with your health care provider to regularly review all of the medicines you are taking, including over-the-counter medicines. Ask your health care provider about any side effects that may make you more likely to have a fall. Tell your health care provider if any medicines that you take make you feel dizzy or sleepy. Strength and balance checks. Your health care provider may recommend certain tests to check your strength and balance while standing, walking, or changing positions. Foot health exam. Foot pain and numbness, as well as not wearing proper footwear, can make you more likely to have a fall. Screenings, including: Osteoporosis screening. Osteoporosis is a condition that causes the bones to get weaker and break more easily. Blood pressure screening. Blood pressure changes and medicines to control blood pressure can make you feel dizzy. Depression  screening. You may be more likely to have a fall if you have a fear of falling, feel depressed, or feel unable to do activities that you used to do. Alcohol use screening. Using too much alcohol can affect your balance and may make you more likely to have a fall. Follow these instructions at home: Lifestyle Do not drink alcohol if: Your health care provider tells you not to drink. If you drink alcohol: Limit how much you have to: 0-1 drink a day for women. 0-2 drinks a day for men. Know how much alcohol is in your drink. In the [place] one drink equals one 12 oz bottle of beer (355 mL), one 5 oz glass of wine (148 mL), or one 1 oz glass of hard liquor (44 mL). Do not use any products that contain nicotine or tobacco. These products include cigarettes, chewing tobacco, and vaping devices, such as e-cigarettes. If you need help quitting, ask your health care provider. Activity  Follow a regular exercise program to stay fit. This will help you maintain your balance. Ask your health care provider what types of exercise are appropriate for you. If you need a cane or walker, use it as recommended by your health care provider. Wear supportive shoes that have nonskid soles. Safety  Remove any tripping hazards, such as rugs, cords, and clutter. Install safety equipment such as grab bars in bathrooms and  safety rails on stairs. Keep rooms and walkways well-lit. General instructions Talk with your health care provider about your risks for falling. Tell your health care provider if: You fall. Be sure to tell your health care provider about all falls, even ones that seem minor. You feel dizzy, tiredness (fatigue), or off-balance. Take over-the-counter and prescription medicines only as told by your health care provider. These include supplements. Eat a healthy diet and maintain a healthy weight. A healthy diet includes low-fat dairy products, low-fat (lean) meats, and fiber from whole grains, beans, and  lots of fruits and vegetables. Stay current with your vaccines. Schedule regular health, dental, and eye exams. Summary Having a healthy lifestyle and getting preventive care can help to protect your health and wellness after age 39. Screening and testing are the best way to find a health problem early and help you avoid having a fall. Early diagnosis and treatment give you the best chance for managing medical conditions that are more common for people who are older than age 61. Falls are a major cause of broken bones and head injuries in people who are older than age 48. Take precautions to prevent a fall at home. Work with your health care provider to learn what changes you can make to improve your health and wellness and to prevent falls. This information is not intended to replace advice given to you by your health care provider. Make sure you discuss any questions you have with your health care provider. Document Revised: 09/19/2020 Document Reviewed: 09/19/2020 Elsevier Patient Education  2024 Elsevier Inc.    Emil Schaumann, MD [facility]

## 2023-12-11 NOTE — Assessment & Plan Note (Signed)
 Grieving recent loss of her son Contributing to her symptoms.SABRA

## 2023-12-11 NOTE — Assessment & Plan Note (Signed)
 Clinically stable.  No red flag signs or symptoms. Recommend blood work and urinalysis Benign physical examination and findings Recommend to stay well-hydrated follow-up with PCP next week

## 2023-12-11 NOTE — Patient Instructions (Signed)
 Health Maintenance After Age 81 After age 4, you are at a higher risk for certain long-term diseases and infections as well as injuries from falls. Falls are a major cause of broken bones and head injuries in people who are older than age 47. Getting regular preventive care can help to keep you healthy and well. Preventive care includes getting regular testing and making lifestyle changes as recommended by your health care provider. Talk with your health care provider about: Which screenings and tests you should have. A screening is a test that checks for a disease when you have no symptoms. A diet and exercise plan that is right for you. What should I know about screenings and tests to prevent falls? Screening and testing are the best ways to find a health problem early. Early diagnosis and treatment give you the best chance of managing medical conditions that are common after age 37. Certain conditions and lifestyle choices may make you more likely to have a fall. Your health care provider may recommend: Regular vision checks. Poor vision and conditions such as cataracts can make you more likely to have a fall. If you wear glasses, make sure to get your prescription updated if your vision changes. Medicine review. Work with your health care provider to regularly review all of the medicines you are taking, including over-the-counter medicines. Ask your health care provider about any side effects that may make you more likely to have a fall. Tell your health care provider if any medicines that you take make you feel dizzy or sleepy. Strength and balance checks. Your health care provider may recommend certain tests to check your strength and balance while standing, walking, or changing positions. Foot health exam. Foot pain and numbness, as well as not wearing proper footwear, can make you more likely to have a fall. Screenings, including: Osteoporosis screening. Osteoporosis is a condition that causes  the bones to get weaker and break more easily. Blood pressure screening. Blood pressure changes and medicines to control blood pressure can make you feel dizzy. Depression screening. You may be more likely to have a fall if you have a fear of falling, feel depressed, or feel unable to do activities that you used to do. Alcohol use screening. Using too much alcohol can affect your balance and may make you more likely to have a fall. Follow these instructions at home: Lifestyle Do not drink alcohol if: Your health care provider tells you not to drink. If you drink alcohol: Limit how much you have to: 0-1 drink a day for women. 0-2 drinks a day for men. Know how much alcohol is in your drink. In the [place] one drink equals one 12 oz bottle of beer (355 mL), one 5 oz glass of wine (148 mL), or one 1 oz glass of hard liquor (44 mL). Do not use any products that contain nicotine or tobacco. These products include cigarettes, chewing tobacco, and vaping devices, such as e-cigarettes. If you need help quitting, ask your health care provider. Activity  Follow a regular exercise program to stay fit. This will help you maintain your balance. Ask your health care provider what types of exercise are appropriate for you. If you need a cane or walker, use it as recommended by your health care provider. Wear supportive shoes that have nonskid soles. Safety  Remove any tripping hazards, such as rugs, cords, and clutter. Install safety equipment such as grab bars in bathrooms and safety rails on stairs. Keep rooms and walkways  well-lit. General instructions Talk with your health care provider about your risks for falling. Tell your health care provider if: You fall. Be sure to tell your health care provider about all falls, even ones that seem minor. You feel dizzy, tiredness (fatigue), or off-balance. Take over-the-counter and prescription medicines only as told by your health care provider. These include  supplements. Eat a healthy diet and maintain a healthy weight. A healthy diet includes low-fat dairy products, low-fat (lean) meats, and fiber from whole grains, beans, and lots of fruits and vegetables. Stay current with your vaccines. Schedule regular health, dental, and eye exams. Summary Having a healthy lifestyle and getting preventive care can help to protect your health and wellness after age 11. Screening and testing are the best way to find a health problem early and help you avoid having a fall. Early diagnosis and treatment give you the best chance for managing medical conditions that are more common for people who are older than age 28. Falls are a major cause of broken bones and head injuries in people who are older than age 48. Take precautions to prevent a fall at home. Work with your health care provider to learn what changes you can make to improve your health and wellness and to prevent falls. This information is not intended to replace advice given to you by your health care provider. Make sure you discuss any questions you have with your health care provider. Document Revised: 09/19/2020 Document Reviewed: 09/19/2020 Elsevier Patient Education  2024 [employer].

## 2023-12-12 ENCOUNTER — Ambulatory Visit: Payer: Self-pay | Admitting: Emergency Medicine

## 2023-12-12 LAB — URINE CULTURE: Result:: NO GROWTH

## 2024-01-01 ENCOUNTER — Encounter: Payer: Self-pay | Admitting: Emergency Medicine

## 2024-01-01 ENCOUNTER — Other Ambulatory Visit: Payer: Self-pay

## 2024-01-01 ENCOUNTER — Ambulatory Visit
Admission: EM | Admit: 2024-01-01 | Discharge: 2024-01-01 | Disposition: A | Discharge status: Home / Self Care | Attending: Nurse Practitioner | Admitting: Nurse Practitioner

## 2024-01-01 DIAGNOSIS — Z20822 Contact with and (suspected) exposure to covid-19: Secondary | ICD-10-CM

## 2024-01-01 LAB — POC SOFIA SARS ANTIGEN FIA: SARS Coronavirus 2 Ag: NEGATIVE

## 2024-01-01 NOTE — ED Provider Notes (Signed)
 [facility]    CSN: [id number] Arrival date & time: 01/01/24  1238      History   Chief Complaint Chief Complaint  Patient presents with   Covid Exposure    HPI Monica [name] is a 83 y.o. female.   The history is provided by the patient.   Patient presents for COVID testing after she was exposed 1 day ago.  Patient states while she was waiting to be brought back for her appointment today, she developed pain in her head and in her throat.  She denies fever, chills, nasal congestion, runny nose, cough, chest pain, abdominal pain, nausea, vomiting, diarrhea, or rash.  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   DEPRESSION, SITUATIONAL    GERD (gastroesophageal reflux disease)    w/ HH (EGD 2010)   GLAUCOMA    Hypertension    Iron deficiency anemia due to chronic blood loss 02/05/2014   LOW BACK PAIN, CHRONIC    Mixed hyperlipidemia    OSTEOPENIA    Routine general medical examination at a health care facility 06/18/2016   Stricture and stenosis of esophagus 05/14/2004    Patient Active Problem List   Diagnosis Date Noted   Fatigue 12/11/2023   Nausea without vomiting 12/11/2023   Aortic regurgitation 03/20/2023   Palpitations 03/06/2022   Bilateral hearing loss 08/11/2021   Insomnia 01/25/2021   Allergic rhinitis 01/28/2019   Pain of left breast 01/28/2019   Routine general medical examination at a health care facility 06/18/2016   Internal hemorrhoids 07/02/2013   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/09/2013   Frozen shoulder 03/03/2012   Chronic constipation 02/27/2011   GERD (gastroesophageal reflux disease) 02/27/2011   Hyperlipidemia associated with type 2 diabetes mellitus (HCC) 01/14/2009   Grieving 12/03/2008   ESOPHAGEAL STRICTURE 11/11/2008   History of colon polyps 11/11/2008   Essential hypertension 12/01/2007   OSTEOPENIA  09/12/2007   Unspecified glaucoma 05/07/2007   Arthropathy 05/07/2007    Past Surgical History:  Procedure Laterality Date   BACK SURGERY     COLONOSCOPY  09/21/2013   [facility]   ESOPHAGEAL MANOMETRY N/A 02/09/2013   Procedure: ESOPHAGEAL MANOMETRY (EM);  Surgeon: Alm JONELLE Gander, MD;  Location: [facility];  Service: Endoscopy;  Laterality: N/A;   EYE SURGERY     frozen shoulder surgery Left    HEMORROIDECTOMY     HYSTEROSCOPY WITH D & C  07/20/2011   Procedure: DILATATION AND CURETTAGE /HYSTEROSCOPY;  Surgeon: Debby JULIANNA Lares, MD;  Location: [facility];  Service: Gynecology;  Laterality: N/A;   POLYPECTOMY  07/20/2011   Procedure: POLYPECTOMY;  Surgeon: Debby JULIANNA Lares, MD;  Location: [facility];  Service: Gynecology;  Laterality: N/A;   POLYPECTOMY     ROTATOR CUFF REPAIR     right    TUBAL LIGATION      OB History   No obstetric history on file.      Home Medications    Prior to Admission medications   Medication Sig Start Date End Date Taking? Authorizing Provider  amLODipine  (NORVASC ) 5 MG tablet Take 1 tablet (5 mg total) by mouth daily. 12/17/22   Rollene Almarie LABOR, MD  aspirin 81 MG tablet Take 81 mg by mouth daily.    [provider]  dexlansoprazole  (DEXILANT ) 60 MG capsule Take 1 capsule by mouth once daily 10/23/23  Rollene Almarie LABOR, MD  loteprednol (LOTEMAX) 0.5 % ophthalmic suspension Place 1 drop into both eyes 4 (four) times daily.    [provider]  meloxicam  (MOBIC ) 7.5 MG tablet Take 1 tablet by mouth once daily 08/20/22   Rollene Almarie LABOR, MD  polyethylene glycol (MIRALAX  / GLYCOLAX ) 17 g packet Take 17 g by mouth 2 (two) times daily as needed.    [provider]  pravastatin  (PRAVACHOL ) 20 MG tablet Take 1 tablet by mouth once daily 09/04/23   Rollene Almarie LABOR, MD  RESTASIS 0.05 % ophthalmic emulsion Place 1 drop into both eyes 2 (two) times daily. Twice a day 08/18/23   [provider]    Family History Family  History  Problem Relation Age of Onset   Diabetes Mother    Heart failure Father    Breast cancer Sister    Colon cancer Sister 31   Diabetes Brother    Colon cancer Son    Lung cancer Son    Liver cancer Son    Esophageal cancer Neg Hx    Rectal cancer Neg Hx    Stomach cancer Neg Hx    Colon polyps Neg Hx     Social History Social History   Tobacco Use   Smoking status: Never   Smokeless tobacco: Never  Vaping Use   Vaping status: Never Used  Substance Use Topics   Alcohol use: No    Alcohol/week: 0.0 standard drinks of alcohol   Drug use: No     Allergies   Codeine and Hydrochlorothiazide   Review of Systems Review of Systems Per HPI  Physical Exam Triage Vital Signs ED Triage Vitals  Encounter Vitals Group     BP 01/01/24 1414 125/75     Girls Systolic BP Percentile --      Girls Diastolic BP Percentile --      Boys Systolic BP Percentile --      Boys Diastolic BP Percentile --      Pulse Rate 01/01/24 1414 67     Resp 01/01/24 1414 20     Temp 01/01/24 1414 98.8 F (37.1 C)     Temp Source 01/01/24 1414 Oral     SpO2 01/01/24 1414 96 %     Weight --      Height --      Head Circumference --      Peak Flow --      Pain Score 01/01/24 1412 1     Pain Loc --      Pain Education --      Exclude from Growth Chart --    No data found.  Updated Vital Signs BP 125/75 (BP Location: Right Arm)   Pulse 67   Temp 98.8 F (37.1 C) (Oral)   Resp 20   SpO2 96%   Visual Acuity Right Eye Distance:   Left Eye Distance:   Bilateral Distance:    Right Eye Near:   Left Eye Near:    Bilateral Near:     Physical Exam Vitals and nursing note reviewed.  Constitutional:      General: She is not in acute distress.    Appearance: Normal appearance.  HENT:     Head: Normocephalic.     Right Ear: Tympanic membrane, ear canal and external ear normal.     Left Ear: Tympanic membrane, ear canal and external ear normal.     Nose: Nose normal.      Mouth/Throat:  Mouth: Mucous membranes are moist.     Pharynx: No posterior oropharyngeal erythema.  Eyes:     Extraocular Movements: Extraocular movements intact.     Conjunctiva/sclera: Conjunctivae normal.     Pupils: Pupils are equal, round, and reactive to light.  Cardiovascular:     Rate and Rhythm: Normal rate and regular rhythm.     Pulses: Normal pulses.     Heart sounds: Normal heart sounds.  Pulmonary:     Effort: Pulmonary effort is normal. No respiratory distress.     Breath sounds: No stridor. No wheezing, rhonchi or rales.  Abdominal:     General: Bowel sounds are normal.     Palpations: Abdomen is soft.  Musculoskeletal:     Cervical back: Normal range of motion.  Skin:    General: Skin is warm and dry.  Neurological:     General: No focal deficit present.     Mental Status: She is alert and oriented to person, place, and time.  Psychiatric:        Mood and Affect: Mood normal.        Behavior: Behavior normal.      UC Treatments / Results  Labs (all labs ordered are listed, but only abnormal results are displayed) Labs Reviewed  POC SOFIA SARS ANTIGEN FIA    EKG   Radiology No results found.  Procedures Procedures (including critical care time)  Medications Ordered in UC Medications - No data to display  Initial Impression / Assessment and Plan / UC Course  I have reviewed the triage vital signs and the nursing notes.  Pertinent labs & imaging results that were available during my care of the patient were reviewed by me and considered in my medical decision making (see chart for details).  The COVID test was negative today.  On exam, the patient is well-appearing, she is in no acute distress, vital signs are stable.  Current symptoms most likely a viral etiology.  Supportive care recommendations were provided and discussed with the patient to include fluids, rest, and over-the-counter analgesics.  Also recommended warm salt water gargles.   Patient was given indications regarding follow-up.  Patient was in agreement with this plan of care and verbalizes understanding.  All questions were answered.  Patient stable for discharge.   Final Clinical Impressions(s) / UC Diagnoses   Final diagnoses:  Exposure to COVID-19 virus     Discharge Instructions      Your COVID test was negative today. You may take over-the-counter Tylenol  as needed for pain, fever, or general discomfort. For your throat symptoms, recommend warm salt water gargles or over-the-counter Chloraseptic throat spray or throat lozenges as needed. You may follow-up in this clinic or with your primary care physician if symptoms begin to worsen. Follow-up as needed.      ED Prescriptions   None    PDMP not reviewed this encounter.   Gilmer Etta PARAS, NP 01/01/24 1430

## 2024-01-01 NOTE — ED Triage Notes (Signed)
 Pt reports was exposed to covid yesterday. Pt reports since being in waiting room headache and sore throat.

## 2024-01-01 NOTE — Discharge Instructions (Signed)
 Your COVID test was negative today. You may take over-the-counter Tylenol  as needed for pain, fever, or general discomfort. For your throat symptoms, recommend warm salt water gargles or over-the-counter Chloraseptic throat spray or throat lozenges as needed. You may follow-up in this clinic or with your primary care physician if symptoms begin to worsen. Follow-up as needed.

## 2024-01-03 ENCOUNTER — Other Ambulatory Visit: Payer: Self-pay | Admitting: Internal Medicine

## 2024-02-14 ENCOUNTER — Other Ambulatory Visit: Payer: Self-pay | Admitting: Internal Medicine

## 2024-02-20 DIAGNOSIS — H401122 Primary open-angle glaucoma, left eye, moderate stage: Secondary | ICD-10-CM | POA: Diagnosis not present

## 2024-03-04 ENCOUNTER — Ambulatory Visit: Admitting: Internal Medicine

## 2024-03-04 VITALS — BP 130/70 | HR 67 | Temp 97.9°F | Ht 63.7 in | Wt 142.8 lb

## 2024-03-04 DIAGNOSIS — F4321 Adjustment disorder with depressed mood: Secondary | ICD-10-CM | POA: Diagnosis not present

## 2024-03-04 DIAGNOSIS — D2230 Melanocytic nevi of unspecified part of face: Secondary | ICD-10-CM

## 2024-03-04 NOTE — Patient Instructions (Signed)
 Try some grief counseling through hospice which is free.  We will get you in with a dermatologist to remove the mole on your face.

## 2024-03-04 NOTE — Progress Notes (Signed)
 "  Subjective:   Patient ID: Monica [name], female    DOB: November 16, 1940, 83 y.o.   MRN: [id number]  Discussed the use of AI scribe software for clinical note transcription with the patient, who gave verbal consent to proceed.  History of Present Illness Monica [name] is an 83 year old female who presents with a facial lesion that has been bleeding.  She has a lesion on her face, located near her glasses and hairline, which has been present for some time. The lesion has bled, likely due to being accidentally hit with a comb. It appears to have shrunk since it bled, but she believes it has grown slightly over the past year.  She is experiencing nervousness, which she attributes to the loss of her son. She describes a significant emotional impact, stating that it feels like a 'big hole' in her life. Her daughter has noticed a change in her since the loss. She finds some solace in attending church, which she continues to enjoy. 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  Review of Systems  Constitutional: Negative.   HENT: Negative.    Eyes: Negative.   Respiratory:  Negative for cough, chest tightness and shortness of breath.   Cardiovascular:  Negative for chest pain, palpitations and leg swelling.  Gastrointestinal:  Negative for abdominal distention, abdominal pain, constipation, diarrhea, nausea and vomiting.  Musculoskeletal: Negative.   Skin: Negative.   Neurological: Negative.   Psychiatric/Behavioral:  Positive for dysphoric mood. The patient is nervous/anxious.     Objective:  Physical Exam Constitutional:      Appearance: She is well-developed.  HENT:     Head: Normocephalic and atraumatic.     Ears:     Comments: Mole left face with flat portion and raised portion, raised portion is able to be bent and underneath exposed with some stigmata of recent bleeding Cardiovascular:     Rate and Rhythm: Normal rate  and regular rhythm.  Pulmonary:     Effort: Pulmonary effort is normal. No respiratory distress.     Breath sounds: Normal breath sounds. No wheezing or rales.  Abdominal:     General: Bowel sounds are normal. There is no distension.     Palpations: Abdomen is soft.     Tenderness: There is no abdominal tenderness.  Musculoskeletal:     Cervical back: Normal range of motion.  Skin:    General: Skin is warm and dry.  Neurological:     Mental Status: She is alert and oriented to person, place, and time.     Coordination: Coordination normal.     Vitals:   03/04/24 0936  BP: 130/70  Pulse: 67  Temp: 97.9 F (36.6 C)  TempSrc: Oral  SpO2: 99%  Weight: 142 lb 12.8 oz (64.8 kg)  Height: 5' 3.7 (1.618 m)    Assessment and Plan Assessment & Plan Facial skin lesion   A lesion near the hairline occasionally bleeds and is likely benign. Refer to a dermatologist for evaluation and possible cryotherapy.  Grief reaction   Grief after the loss of her son causes nervousness and an emotional void, impacting daily life, though she finds some joy in activities. Encourage participation in joyful activities like church and hobbies. Discuss grief counseling options, including free services by [facility] [place].  I personally spent a total of 20 minutes in the care of the patient today including getting/reviewing separately obtained  history, performing a medically appropriate exam/evaluation, and counseling and educating.  "

## 2024-03-05 ENCOUNTER — Encounter: Payer: Self-pay | Admitting: Internal Medicine

## 2024-03-14 ENCOUNTER — Other Ambulatory Visit: Payer: Self-pay | Admitting: Internal Medicine

## 2024-03-16 ENCOUNTER — Ambulatory Visit: Admitting: Family Medicine

## 2024-03-16 ENCOUNTER — Ambulatory Visit: Payer: Self-pay

## 2024-03-16 ENCOUNTER — Encounter: Payer: Self-pay | Admitting: Family Medicine

## 2024-03-16 VITALS — BP 132/70 | HR 66 | Temp 97.8°F | Resp 18 | Ht 63.7 in | Wt 144.0 lb

## 2024-03-16 DIAGNOSIS — D2230 Melanocytic nevi of unspecified part of face: Secondary | ICD-10-CM | POA: Diagnosis not present

## 2024-03-16 DIAGNOSIS — R0789 Other chest pain: Secondary | ICD-10-CM

## 2024-03-16 MED ORDER — PREDNISONE 10 MG (21) PO TBPK: ORAL_TABLET | ORAL | 0 refills | Status: DC

## 2024-03-16 NOTE — Patient Instructions (Signed)
 Christyne Hurst in [facility] in [place]

## 2024-03-16 NOTE — Progress Notes (Signed)
 Assessment & Plan Musculoskeletal chest pain Pain reproducible with palpation, indicating musculoskeletal origin. Orders:   predniSONE (STERAPRED UNI-PAK 21 TAB) 10 MG (21) TBPK tablet; As directed x 6 days  Change in facial mole Dermatology appointment scheduled, seeking earlier evaluation. - Provided contact information for Dr. Christyne Hurst in [place] and [facility] in [place]. - Advised her to contact these dermatologists for earlier appointments.        Follow up plan: Return if symptoms worsen or fail to improve.  Niki Rung, MSN, APRN, FNP-C  Subjective:  HPI: Monica [name] is a 83 y.o. female presenting on 03/16/2024 for Chest Pain (Chest soreness - started a few days ago after lifting some cases of water. No SOB, No pain radiating to arms or neck. /)  Discussed the use of AI scribe software for clinical note transcription with the patient, who gave verbal consent to proceed.  She has been experiencing intermittent chest soreness for about a week, particularly aggravated by activities such as making the bed and reaching up. The soreness does not immediately subside after stopping these activities and is worse in the mornings. She does not take any medication for the soreness and allows it to wear off on its own. She recalls first noticing the soreness after lifting cases of water at the drugstore. No shortness of breath or swelling.       ROS: Negative unless specifically indicated above in HPI.   Relevant past medical history reviewed and updated as indicated.   Allergies and medications reviewed and updated.   Current Outpatient Medications:    amLODipine  (NORVASC ) 5 MG tablet, Take 1 tablet by mouth once daily, Disp: 90 tablet, Rfl: 3   aspirin 81 MG tablet, Take 81 mg by mouth daily., Disp: , Rfl:    dexlansoprazole  (DEXILANT ) 60 MG capsule, Take 1 capsule by mouth once daily, Disp: 90 capsule, Rfl: 0   meloxicam  (MOBIC ) 7.5 MG tablet, Take 1 tablet  by mouth once daily, Disp: 90 tablet, Rfl: 0   polyethylene glycol (MIRALAX  / GLYCOLAX ) 17 g packet, Take 17 g by mouth 2 (two) times daily as needed., Disp: , Rfl:    pravastatin  (PRAVACHOL ) 20 MG tablet, Take 1 tablet by mouth once daily, Disp: 90 tablet, Rfl: 0   predniSONE (STERAPRED UNI-PAK 21 TAB) 10 MG (21) TBPK tablet, As directed x 6 days, Disp: 21 tablet, Rfl: 0   RESTASIS 0.05 % ophthalmic emulsion, Place 1 drop into both eyes 2 (two) times daily. Twice a day, Disp: , Rfl:    loteprednol (LOTEMAX) 0.5 % ophthalmic suspension, Place 1 drop into both eyes 4 (four) times daily., Disp: , Rfl:   Allergies  Allergen Reactions   Codeine     REACTION: Upset stomach   Hydrochlorothiazide     REACTION: Nausea and headache    Objective:   BP 132/70   Pulse 66   Temp 97.8 F (36.6 C)   Resp 18   Ht 5' 3.7 (1.618 m)   Wt 144 lb (65.3 kg)   SpO2 96%   BMI 24.95 kg/m    Physical Exam Vitals reviewed.  Constitutional:      General: She is not in acute distress.    Appearance: Normal appearance. She is not ill-appearing, toxic-appearing or diaphoretic.  HENT:     Head: Normocephalic and atraumatic.  Eyes:     General: No scleral icterus.       Right eye: No discharge.  Left eye: No discharge.     Conjunctiva/sclera: Conjunctivae normal.  Cardiovascular:     Rate and Rhythm: Normal rate. Rhythm irregular.     Comments: H/O PVCs Chest pain reproducible with palpation.  Pulmonary:     Effort: Pulmonary effort is normal. No respiratory distress.  Musculoskeletal:        General: Normal range of motion.     Cervical back: Normal range of motion.  Skin:    General: Skin is warm and dry.     Capillary Refill: Capillary refill takes less than 2 seconds.  Neurological:     General: No focal deficit present.     Mental Status: She is alert and oriented to person, place, and time. Mental status is at baseline.  Psychiatric:        Mood and Affect: Mood normal.         Behavior: Behavior normal.        Thought Content: Thought content normal.        Judgment: Judgment normal.

## 2024-03-16 NOTE — Telephone Encounter (Signed)
 FYI Only or Action Required?: FYI only for provider: appointment scheduled on 03/16/24.  Patient was last seen in primary care on 03/04/2024 by Rollene Almarie LABOR, MD.  Called Nurse Triage reporting Chest Pain.  Symptoms began 1-2 days ago.  Interventions attempted: Nothing.  Symptoms are: stable.  Triage Disposition: See Physician Within 24 Hours  Patient/caregiver understands and will follow disposition?: Yes                            Copied from CRM #[id number]. Topic: Clinical - Red Word Triage >> Mar 16, 2024  9:49 AM Antony RAMAN wrote: Red Word that prompted transfer to Nurse Triage: soreness and pain in chest Reason for Disposition  [1] Chest pain lasts < 5 minutes AND [2] NO chest pain or cardiac symptoms (e.g., breathing difficulty, sweating) now  (Exception: Chest pains that last only a few seconds.)  Answer Assessment - Initial Assessment Questions 1. LOCATION: Where does it hurt?       Right above left breast 2. RADIATION: Does the pain go anywhere else? (e.g., into neck, jaw, arms, back)     Denies radiation to arms, back and jaw, states pain stays localized 3. ONSET: When did the chest pain begin? (Minutes, hours or days)      1-2 days ago 4. PATTERN: Does the pain come and go, or has it been constant since it started?  Does it get worse with exertion?      Intermittent, bending over and reaching for objects makes pain worse 5. DURATION: How long does it last (e.g., seconds, minutes, hours)     Patient states it depends 6. SEVERITY: How bad is the pain?  (e.g., Scale 1-10; mild, moderate, or severe)     It feels sore, rates pain about a 4, states pain can be sharp at times, denies pain at this time 7. CARDIAC RISK FACTORS: Do you have any history of heart problems or risk factors for heart disease? (e.g., angina, prior heart attack; diabetes, high blood pressure, high cholesterol, smoker, or strong family history of heart  disease)     Denies 8. PULMONARY RISK FACTORS: Do you have any history of lung disease?  (e.g., blood clots in lung, asthma, emphysema, birth control pills)     Denies 9. CAUSE: What do you think is causing the chest pain?     Unsure, states she lifted cases of water about a week ago  10. OTHER SYMPTOMS: Do you have any other symptoms? (e.g., dizziness, nausea, vomiting, sweating, fever, difficulty breathing, cough)     Congestion ongoing for awhile, denies difficulty breathing, denies nausea, denies vomiting, denies dizziness, denies abdominal pain 11. PREGNANCY: Is there any chance you are pregnant? When was your last menstrual period?     N/A  Protocols used: Chest Pain-A-AH

## 2024-04-11 ENCOUNTER — Other Ambulatory Visit: Payer: Self-pay | Admitting: Internal Medicine

## 2024-04-16 NOTE — Progress Notes (Signed)
 Pharmacy Quality Measure Review  This patient is appearing on a report for being at risk of failing the adherence measure for cholesterol (statin) medications this calendar year.   Medication: Pravastatin  20mg  Last fill date: 12/01 for 90 day supply  Insurance report was not up to date. No action needed at this time.   Angela Baalmann, PharmD [facility] Pharmacist

## 2024-05-08 ENCOUNTER — Ambulatory Visit: Attending: Physician Assistant | Admitting: Physician Assistant

## 2024-05-08 ENCOUNTER — Encounter: Payer: Self-pay | Admitting: Physician Assistant

## 2024-05-08 VITALS — BP 130/78 | HR 78 | Ht 64.0 in | Wt 144.0 lb

## 2024-05-08 DIAGNOSIS — R0789 Other chest pain: Secondary | ICD-10-CM

## 2024-05-08 DIAGNOSIS — I10 Essential (primary) hypertension: Secondary | ICD-10-CM | POA: Diagnosis not present

## 2024-05-08 DIAGNOSIS — E785 Hyperlipidemia, unspecified: Secondary | ICD-10-CM

## 2024-05-08 DIAGNOSIS — I38 Endocarditis, valve unspecified: Secondary | ICD-10-CM | POA: Diagnosis not present

## 2024-05-08 DIAGNOSIS — R002 Palpitations: Secondary | ICD-10-CM | POA: Diagnosis not present

## 2024-05-08 NOTE — Progress Notes (Signed)
 " Cardiology Office Note:  .   Date:  05/08/2024  ID:  Monica [name], DOB 1941/03/31, MRN [id number] PCP: Rollene Almarie LABOR, MD  [facility] Providers Cardiologist:  Diannah SHAUNNA Maywood, MD {  History of Present Illness: .   Monica [name] is a 83 y.o. female  with PMHx of HTN, HLD, DM2, palpitations, valvular heart disease who reports to [facility] office for 1 year follow up.   Pertinent cardiac medical history:  ZIO 05/2021: NSR, avg HR 74, 11 brief SVT runs, occasional PAC, rare PVC Echo 02/2022: Normal EF of 60-65%, mild LVH, G1 DD, mild MV/AV regurgitation  Last seen in heartcare 03/20/2023 by Dr. Mallipeddi for follow-up.  Reported stress related chest tightness/pressure and palpitations but denied any exertional chest pains.  Noted being under a lot of stress due to daughter having a stroke in 04/2022 and son undergoing chemotherapy.  No med changes or interventions recommended.  Continued on amlodipine  5 mg daily, ASA 81 mg daily, pravastatin  20 mg daily.  Today, reports ongoing similar chest tightness/pressure that has not gotten any worse in severity or increased in frequency.  It occurs intermittently approximately 2-3 times per month when she is moving furniture around in the home. She is able to walk around stores and homes without angina. Denies shortness of breath, palpitations, syncope, presyncope, dizziness, orthopnea, PND, swelling or significant weight changes, acute bleeding, or claudication. Reports compliance with medications. She mainly cooks for herself. Mainly she eats baked food and fruits. Her son died in 11-09-24 and she has not had interest to resume exercising. Denies tobacco use/alcohol/drug use. Denies any recent hospitalizations or visits to the emergency department.   ROS: 10 point review of system has been reviewed and considered negative except ones been listed in the HPI.   Studies Reviewed: SABRA   EKG Interpretation Date/Time:  Friday May 08 2024 12:53:42 EST Ventricular Rate:  78 PR Interval:  158 QRS Duration:  58 QT Interval:  368 QTC Calculation: 419 R Axis:   52  Text Interpretation: Sinus rhythm with Premature atrial complexes When compared with ECG of 20-Mar-2023 10:10, Premature atrial complexes are now Present Confirmed by Sheron Hallmark ([id number]) on 05/08/2024 12:54:44 PM   Zio 05/2021 11 episodes of SVT, longest lasting 17 seconds with average rate 128 bpm Occasional PACs (1.3% of beats) Patch Wear Time:  7 days and 1 hours (2023-01-06T14:14:19-0500 to 2023-01-13T15:27:49-0500)   Patient had a min HR of 49 bpm, max HR of 184 bpm, and avg HR of 74 bpm. Predominant underlying rhythm was Sinus Rhythm. 11 Supraventricular Tachycardia runs occurred, the run with the fastest interval lasting 6 beats with a max rate of 184 bpm, the  longest lasting 17.0 secs with an avg rate of 128 bpm. Isolated SVEs were occasional (1.3%, 9471), SVE Couplets were rare (<1.0%, 196), and SVE Triplets were rare (<1.0%, 11). Isolated VEs were rare (<1.0%), and no VE Couplets or VE Triplets were  present. Ventricular Bigeminy and Trigeminy were present.  No patient triggered event  ECHO IMPRESSIONS 02/2022  1. Left ventricular ejection fraction, by estimation, is 60 to 65%. The  left ventricle has normal function. The left ventricle has no regional  wall motion abnormalities. There is mild left ventricular hypertrophy.  Left ventricular diastolic parameters  are consistent with Grade I diastolic dysfunction (impaired relaxation).   2. Right ventricular systolic function is normal. The right ventricular  size is normal. There is normal pulmonary artery systolic pressure.  3. The mitral valve is normal in structure. Mild mitral valve  regurgitation. No evidence of mitral stenosis.   4. The aortic valve is tricuspid. There is mild calcification of the  aortic valve. Aortic valve regurgitation is mild. Aortic valve sclerosis  is present, with  no evidence of aortic valve stenosis.   5. The inferior vena cava is normal in size with greater than 50%  respiratory variability, suggesting right atrial pressure of 3 mmHg.   Physical Exam:   VS:  BP 130/78 (BP Location: Left Arm, Cuff Size: Normal)   Pulse 78   Ht 5' 4 (1.626 m)   Wt 144 lb (65.3 kg)   SpO2 97%   BMI 24.72 kg/m    Wt Readings from Last 3 Encounters:  05/08/24 144 lb (65.3 kg)  03/16/24 144 lb (65.3 kg)  03/04/24 142 lb 12.8 oz (64.8 kg)    GEN: Well nourished, well developed in no acute distress while sitting in chair.  NECK: No JVD; No carotid bruits CARDIAC: RRR, no murmurs, rubs, gallops RESPIRATORY:  Clear to auscultation without rales, wheezing or rhonchi  ABDOMEN: Soft, non-tender, non-distended EXTREMITIES:  No edema; No deformity   ASSESSMENT AND PLAN: .   Atypical Chest Pain Reports ongoing unchanged atypical chest pain that has not worsened since last OV. Denies angina symptoms. Suspect Atypical. No need for further ischemic evaluation at this time.  Offered NTG but patient declines.  If symptoms worsen or increase in frequency then would consider Lexiscan vs CCTA.   Palpitations No significant arrhythmias noted on ZIO in 2023 as above EKG with NSR, HR 78 w/ PAC's.  Denies any palpitations.  If symptoms worsen or become more frequent then would consider repeat ZIO.   Essential hypertension BP this OV well controlled today: 130/78 Continue on amlodipine  5 mg daily Encourage physical activity as best tolerated and heart healthy low sodium diet. Discussed limiting sodium intake to < 2 grams daily.     Hyperlipidemia LDL goal <100 LDL at goal as below.  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  Will plan to order 3 year follow up echo at next annual visit.  Primary prevention of CAD No indication for ASA, however patient prefers to take it.  Continue ASA 81 mg daily     Dispo: Follow up in 1 year with Vm or Scottie   Signed, Lorette CINDERELLA Kapur, PA-C  "

## 2024-05-08 NOTE — Patient Instructions (Signed)
 Medication Instructions:  Your physician recommends that you continue on your current medications as directed. Please refer to the Current Medication list given to you today.   Labwork: None today  Testing/Procedures: None today  Follow-Up: 1 year  Any Other Special Instructions Will Be Listed Below (If Applicable).  If you need a refill on your cardiac medications before your next appointment, please call your pharmacy.

## 2024-06-09 ENCOUNTER — Other Ambulatory Visit: Payer: Self-pay | Admitting: Obstetrics and Gynecology

## 2024-06-09 DIAGNOSIS — Z1231 Encounter for screening mammogram for malignant neoplasm of breast: Secondary | ICD-10-CM

## 2024-06-16 ENCOUNTER — Other Ambulatory Visit: Payer: Self-pay | Admitting: Internal Medicine

## 2024-06-30 ENCOUNTER — Ambulatory Visit

## 2024-08-06 ENCOUNTER — Ambulatory Visit

## 2024-10-13 ENCOUNTER — Ambulatory Visit: Admitting: Dermatology
